# Patient Record
Sex: Female | Born: 1955 | Race: Black or African American | Hispanic: No | Smoking: Never smoker
Health system: Southern US, Community
[De-identification: ages and names within clinical notes are randomized; demographics above are authoritative.]

## PROBLEM LIST (undated history)

## (undated) DIAGNOSIS — M25561 Pain in right knee: Secondary | ICD-10-CM

## (undated) DIAGNOSIS — E785 Hyperlipidemia, unspecified: Secondary | ICD-10-CM

## (undated) DIAGNOSIS — O009 Unspecified ectopic pregnancy without intrauterine pregnancy: Secondary | ICD-10-CM

## (undated) DIAGNOSIS — I10 Essential (primary) hypertension: Secondary | ICD-10-CM

## (undated) DIAGNOSIS — G4700 Insomnia, unspecified: Secondary | ICD-10-CM

## (undated) DIAGNOSIS — G4733 Obstructive sleep apnea (adult) (pediatric): Secondary | ICD-10-CM

## (undated) DIAGNOSIS — I471 Supraventricular tachycardia, unspecified: Secondary | ICD-10-CM

## (undated) DIAGNOSIS — R011 Cardiac murmur, unspecified: Secondary | ICD-10-CM

## (undated) HISTORY — PX: SALPINGECTOMY: SHX328

## (undated) HISTORY — DX: Obstructive sleep apnea (adult) (pediatric): G47.33

## (undated) HISTORY — PX: TMJ ARTHROPLASTY: SHX1066

## (undated) HISTORY — DX: Insomnia, unspecified: G47.00

## (undated) HISTORY — DX: Unspecified ectopic pregnancy without intrauterine pregnancy: O00.90

## (undated) HISTORY — DX: Pain in right knee: M25.561

## (undated) HISTORY — PX: BREAST CYST EXCISION: SHX579

## (undated) HISTORY — DX: Hyperlipidemia, unspecified: E78.5

## (undated) HISTORY — DX: Essential (primary) hypertension: I10

## (undated) HISTORY — PX: ABDOMINAL HYSTERECTOMY: SHX81

## (undated) HISTORY — PX: OTHER SURGICAL HISTORY: SHX169

---

## 2001-03-15 ENCOUNTER — Other Ambulatory Visit: Admission: RE | Admit: 2001-03-15 | Discharge: 2001-03-15 | Payer: Self-pay | Admitting: Obstetrics and Gynecology

## 2001-08-16 ENCOUNTER — Ambulatory Visit (HOSPITAL_COMMUNITY): Admission: RE | Admit: 2001-08-16 | Discharge: 2001-08-16 | Payer: Self-pay | Admitting: Obstetrics and Gynecology

## 2001-08-16 ENCOUNTER — Encounter: Payer: Self-pay | Admitting: Obstetrics and Gynecology

## 2002-04-09 ENCOUNTER — Other Ambulatory Visit: Admission: RE | Admit: 2002-04-09 | Discharge: 2002-04-09 | Payer: Self-pay | Admitting: Obstetrics and Gynecology

## 2003-04-22 ENCOUNTER — Encounter: Payer: Self-pay | Admitting: Family Medicine

## 2003-04-22 ENCOUNTER — Ambulatory Visit (HOSPITAL_COMMUNITY): Admission: RE | Admit: 2003-04-22 | Discharge: 2003-04-22 | Payer: Self-pay | Admitting: Family Medicine

## 2003-12-02 ENCOUNTER — Observation Stay (HOSPITAL_COMMUNITY): Admission: RE | Admit: 2003-12-02 | Discharge: 2003-12-03 | Payer: Self-pay | Admitting: Obstetrics and Gynecology

## 2004-02-05 ENCOUNTER — Emergency Department (HOSPITAL_COMMUNITY): Admission: EM | Admit: 2004-02-05 | Discharge: 2004-02-05 | Payer: Self-pay | Admitting: Emergency Medicine

## 2005-03-15 ENCOUNTER — Ambulatory Visit (HOSPITAL_COMMUNITY): Admission: RE | Admit: 2005-03-15 | Discharge: 2005-03-15 | Payer: Self-pay | Admitting: Family Medicine

## 2005-03-26 ENCOUNTER — Encounter: Admission: RE | Admit: 2005-03-26 | Discharge: 2005-03-26 | Payer: Self-pay | Admitting: Family Medicine

## 2006-06-03 ENCOUNTER — Ambulatory Visit (HOSPITAL_COMMUNITY): Admission: RE | Admit: 2006-06-03 | Discharge: 2006-06-03 | Payer: Self-pay | Admitting: Family Medicine

## 2006-10-18 ENCOUNTER — Other Ambulatory Visit: Admission: RE | Admit: 2006-10-18 | Discharge: 2006-10-18 | Payer: Self-pay | Admitting: Family Medicine

## 2010-12-06 ENCOUNTER — Encounter: Payer: Self-pay | Admitting: Family Medicine

## 2014-11-15 HISTORY — PX: COLONOSCOPY: SHX174

## 2015-04-21 LAB — HM COLONOSCOPY

## 2015-08-27 LAB — HM HEPATITIS C SCREENING LAB: HM Hepatitis Screen: NEGATIVE

## 2015-08-27 LAB — HM HIV SCREENING LAB: HM HIV Screening: NEGATIVE

## 2016-04-20 DIAGNOSIS — I10 Essential (primary) hypertension: Secondary | ICD-10-CM | POA: Diagnosis not present

## 2016-04-20 DIAGNOSIS — Z8249 Family history of ischemic heart disease and other diseases of the circulatory system: Secondary | ICD-10-CM | POA: Diagnosis not present

## 2016-04-20 DIAGNOSIS — F419 Anxiety disorder, unspecified: Secondary | ICD-10-CM | POA: Diagnosis not present

## 2016-04-20 DIAGNOSIS — I159 Secondary hypertension, unspecified: Secondary | ICD-10-CM | POA: Diagnosis not present

## 2016-04-20 DIAGNOSIS — Z885 Allergy status to narcotic agent status: Secondary | ICD-10-CM | POA: Diagnosis not present

## 2016-04-20 DIAGNOSIS — Z6841 Body Mass Index (BMI) 40.0 and over, adult: Secondary | ICD-10-CM | POA: Diagnosis not present

## 2016-04-20 DIAGNOSIS — R42 Dizziness and giddiness: Secondary | ICD-10-CM | POA: Diagnosis not present

## 2016-07-22 DIAGNOSIS — J301 Allergic rhinitis due to pollen: Secondary | ICD-10-CM | POA: Diagnosis not present

## 2016-07-22 DIAGNOSIS — E010 Iodine-deficiency related diffuse (endemic) goiter: Secondary | ICD-10-CM | POA: Diagnosis not present

## 2016-07-22 DIAGNOSIS — R7301 Impaired fasting glucose: Secondary | ICD-10-CM | POA: Diagnosis not present

## 2016-07-22 DIAGNOSIS — M1710 Unilateral primary osteoarthritis, unspecified knee: Secondary | ICD-10-CM | POA: Diagnosis not present

## 2016-07-22 DIAGNOSIS — I10 Essential (primary) hypertension: Secondary | ICD-10-CM | POA: Diagnosis not present

## 2016-08-06 DIAGNOSIS — Z23 Encounter for immunization: Secondary | ICD-10-CM | POA: Diagnosis not present

## 2017-01-14 DIAGNOSIS — I10 Essential (primary) hypertension: Secondary | ICD-10-CM | POA: Diagnosis not present

## 2017-01-14 DIAGNOSIS — M25512 Pain in left shoulder: Secondary | ICD-10-CM | POA: Diagnosis not present

## 2017-01-14 DIAGNOSIS — Z1231 Encounter for screening mammogram for malignant neoplasm of breast: Secondary | ICD-10-CM | POA: Diagnosis not present

## 2017-01-28 DIAGNOSIS — S46812A Strain of other muscles, fascia and tendons at shoulder and upper arm level, left arm, initial encounter: Secondary | ICD-10-CM | POA: Diagnosis not present

## 2017-04-21 DIAGNOSIS — Z6841 Body Mass Index (BMI) 40.0 and over, adult: Secondary | ICD-10-CM | POA: Diagnosis not present

## 2017-04-21 DIAGNOSIS — M25561 Pain in right knee: Secondary | ICD-10-CM | POA: Diagnosis not present

## 2017-04-21 DIAGNOSIS — I10 Essential (primary) hypertension: Secondary | ICD-10-CM | POA: Diagnosis not present

## 2017-07-20 DIAGNOSIS — J0101 Acute recurrent maxillary sinusitis: Secondary | ICD-10-CM | POA: Diagnosis not present

## 2017-07-20 DIAGNOSIS — I10 Essential (primary) hypertension: Secondary | ICD-10-CM | POA: Diagnosis not present

## 2017-07-20 DIAGNOSIS — Z6841 Body Mass Index (BMI) 40.0 and over, adult: Secondary | ICD-10-CM | POA: Diagnosis not present

## 2017-07-20 DIAGNOSIS — M25561 Pain in right knee: Secondary | ICD-10-CM | POA: Diagnosis not present

## 2017-07-25 DIAGNOSIS — R05 Cough: Secondary | ICD-10-CM | POA: Diagnosis not present

## 2017-07-25 DIAGNOSIS — R062 Wheezing: Secondary | ICD-10-CM | POA: Diagnosis not present

## 2017-07-25 DIAGNOSIS — Z6841 Body Mass Index (BMI) 40.0 and over, adult: Secondary | ICD-10-CM | POA: Diagnosis not present

## 2017-09-02 DIAGNOSIS — Z1231 Encounter for screening mammogram for malignant neoplasm of breast: Secondary | ICD-10-CM | POA: Diagnosis not present

## 2017-11-23 DIAGNOSIS — I10 Essential (primary) hypertension: Secondary | ICD-10-CM | POA: Diagnosis not present

## 2017-11-23 DIAGNOSIS — Z6841 Body Mass Index (BMI) 40.0 and over, adult: Secondary | ICD-10-CM | POA: Diagnosis not present

## 2017-11-23 DIAGNOSIS — G4700 Insomnia, unspecified: Secondary | ICD-10-CM | POA: Diagnosis not present

## 2017-11-23 DIAGNOSIS — M25561 Pain in right knee: Secondary | ICD-10-CM | POA: Diagnosis not present

## 2017-12-14 DIAGNOSIS — I10 Essential (primary) hypertension: Secondary | ICD-10-CM | POA: Diagnosis not present

## 2017-12-14 DIAGNOSIS — J0101 Acute recurrent maxillary sinusitis: Secondary | ICD-10-CM | POA: Diagnosis not present

## 2017-12-27 DIAGNOSIS — G4700 Insomnia, unspecified: Secondary | ICD-10-CM | POA: Diagnosis not present

## 2017-12-27 DIAGNOSIS — I10 Essential (primary) hypertension: Secondary | ICD-10-CM | POA: Diagnosis not present

## 2017-12-27 DIAGNOSIS — M25561 Pain in right knee: Secondary | ICD-10-CM | POA: Diagnosis not present

## 2017-12-27 DIAGNOSIS — Z6841 Body Mass Index (BMI) 40.0 and over, adult: Secondary | ICD-10-CM | POA: Diagnosis not present

## 2018-01-19 ENCOUNTER — Encounter: Payer: Self-pay | Admitting: *Deleted

## 2018-01-20 ENCOUNTER — Ambulatory Visit: Payer: BLUE CROSS/BLUE SHIELD | Admitting: Cardiovascular Disease

## 2018-01-20 ENCOUNTER — Telehealth: Payer: Self-pay | Admitting: Cardiovascular Disease

## 2018-01-20 ENCOUNTER — Encounter: Payer: Self-pay | Admitting: Cardiovascular Disease

## 2018-01-20 VITALS — BP 125/76 | HR 70 | Ht 70.0 in | Wt 295.0 lb

## 2018-01-20 DIAGNOSIS — I10 Essential (primary) hypertension: Secondary | ICD-10-CM

## 2018-01-20 DIAGNOSIS — I471 Supraventricular tachycardia: Secondary | ICD-10-CM | POA: Diagnosis not present

## 2018-01-20 NOTE — Telephone Encounter (Signed)
Pre-cert Verification for the following procedure   °Echo scheduled for 01-25-2018  °

## 2018-01-20 NOTE — Progress Notes (Signed)
CARDIOLOGY CONSULT NOTE  Patient ID: Kristine Mata MRN: [id number] DOB/AGE: May 29, 1956 62 y.o.  Admit date: (Not on file) Primary Physician: Estanislado Pandy, MD Referring Physician: Almedia Balls PA-C  Reason for Consultation: Paroxysmal SVT  HPI: Kristine Mata is a 62 y.o. female who is being seen today for the evaluation of paroxysmal SVT at the request of Almedia Balls, PA-C.  ECG performed today which I reviewed demonstrates sinus rhythm with LVH criteria.    I reviewed labs performed on 11/23/17: BUN 9, creatinine 0.63, sodium 143, potassium 4.1, hemoglobin 11.9, TSH 0.5.  Additional past medical history includes morbid obesity and hypertension.  She tells me she worked for [employer] for the past 10 years and moved back to [place] from [place] about a year ago.  She had been on losartan for hypertension and was switched to only [place].  She had been free of palpitations for the past 10 years but after this medication switch, she developed frequent palpitations sometimes awakening her at night.  She denies associated chest pain and shortness of breath.  She was then placed back on losartan.  She then had occasional palpitation for about 2 weeks but has had no palpitations whatsoever for the past 2 weeks.  She said she has put on a lot of weight and wants to begin exercising at the [facility].  She does have some right knee arthritis.  She said her first episode of SVT occurred in 1987 and she was evaluated at [facility] at that time.  She said she began working at the age of 17 and this is the first year she has not been working.  She also worked for [employer] for 23 years.    Allergies  Allergen Reactions  . Codeine Nausea And Vomiting    Current Outpatient Medications  Medication Sig Dispense Refill  . furosemide (LASIX) 40 MG tablet Take 40 mg by mouth.    . losartan (COZAAR) 100 MG tablet Take 100 mg by mouth daily.    . metoprolol (TOPROL-XL) 200 MG 24 hr  tablet Take 200 mg by mouth daily.    . potassium chloride (K-DUR,KLOR-CON) 10 MEQ tablet Take 10 mEq by mouth daily.     No current facility-administered medications for this visit.     Past Medical History:  Diagnosis Date  . Hypertension   . Insomnia, unspecified   . Right knee pain     History reviewed. No pertinent surgical history.  Social History   Socioeconomic History  . Marital status: Married    Spouse name: Not on file  . Number of children: Not on file  . Years of education: Not on file  . Highest education level: Not on file  Social Needs  . Financial resource strain: Not on file  . Food insecurity - worry: Not on file  . Food insecurity - inability: Not on file  . Transportation needs - medical: Not on file  . Transportation needs - non-medical: Not on file  Occupational History  . Not on file  Tobacco Use  . Smoking status: Never Smoker  . Smokeless tobacco: Never Used  Substance and Sexual Activity  . Alcohol use: Not on file  . Drug use: Not on file  . Sexual activity: Not on file  Other Topics Concern  . Not on file  Social History Narrative  . Not on file     No family history of premature CAD in 1st degree relatives.  Current Meds  Medication Sig  . furosemide (LASIX) 40 MG tablet Take 40 mg by mouth.  . losartan (COZAAR) 100 MG tablet Take 100 mg by mouth daily.  . metoprolol (TOPROL-XL) 200 MG 24 hr tablet Take 200 mg by mouth daily.  . potassium chloride (K-DUR,KLOR-CON) 10 MEQ tablet Take 10 mEq by mouth daily.      Review of systems complete and found to be negative unless listed above in HPI    Physical exam Blood pressure 125/76, pulse 70, height 5\' 10"  (1.778 m), weight 295 lb (133.8 kg), SpO2 96 %. General: NAD Neck: No JVD, no thyromegaly or thyroid nodule.  Lungs: Clear to auscultation bilaterally with normal respiratory effort. CV: Nondisplaced PMI. Regular rate and rhythm, normal S1/S2, no S3/S4, no murmur.  Trace left  peri-ankle and dorsal pedal edema.  No carotid bruit.    Abdomen: Soft, nontender, no distention.  Skin: Intact without lesions or rashes.  Neurologic: Alert and oriented x 3.  Psych: Normal affect. Extremities: No clubbing or cyanosis.  HEENT: Normal.   ECG: Most recent ECG reviewed.   Labs: No results found for: K, BUN, CREATININE, ALT, TSH, HGB   Lipids: No results found for: LDLCALC, LDLDIRECT, CHOL, TRIG, HDL      ASSESSMENT AND PLAN:  1.  Paroxysmal SVT: Symptomatically stable for the past 2 weeks.  Continue metoprolol succinate.  If she were to develop symptom recurrence, I would consider event monitoring at that time.  2.  Hypertension: Controlled on present therapy.  LVH is seen on ECG as noted above.  I will obtain an echocardiogram to evaluate cardiac structure and function.     Disposition: Follow up in 6 months  Signed: Prentice DockerSuresh Demetrie Borge, M.D., F.A.C.C.  01/20/2018, 9:27 AM

## 2018-01-20 NOTE — Patient Instructions (Signed)
Your physician wants you to follow-up in: 6 MONTHS WITH DR KONESWARAN You will receive a reminder letter in the mail two months in advance. If you don't receive a letter, please call our office to schedule the follow-up appointment.  Your physician recommends that you continue on your current medications as directed. Please refer to the Current Medication list given to you today.  Your physician has requested that you have an echocardiogram. Echocardiography is a painless test that uses sound waves to create images of your heart. It provides your doctor with information about the size and shape of your heart and how well your heart's chambers and valves are working. This procedure takes approximately one hour. There are no restrictions for this procedure.  Thank you for choosing [facility]!!    

## 2018-01-24 ENCOUNTER — Other Ambulatory Visit: Payer: Self-pay | Admitting: Cardiovascular Disease

## 2018-01-24 ENCOUNTER — Other Ambulatory Visit: Payer: BLUE CROSS/BLUE SHIELD

## 2018-01-24 DIAGNOSIS — R0989 Other specified symptoms and signs involving the circulatory and respiratory systems: Secondary | ICD-10-CM

## 2018-01-24 DIAGNOSIS — I471 Supraventricular tachycardia: Secondary | ICD-10-CM

## 2018-01-24 DIAGNOSIS — R9431 Abnormal electrocardiogram [ECG] [EKG]: Secondary | ICD-10-CM

## 2018-01-25 DIAGNOSIS — J0101 Acute recurrent maxillary sinusitis: Secondary | ICD-10-CM | POA: Diagnosis not present

## 2018-02-15 ENCOUNTER — Other Ambulatory Visit: Payer: BLUE CROSS/BLUE SHIELD

## 2018-03-16 ENCOUNTER — Other Ambulatory Visit: Payer: Self-pay | Admitting: Cardiovascular Disease

## 2018-03-16 ENCOUNTER — Telehealth: Payer: Self-pay | Admitting: Cardiovascular Disease

## 2018-03-16 ENCOUNTER — Other Ambulatory Visit: Payer: Self-pay | Admitting: *Deleted

## 2018-03-16 DIAGNOSIS — I471 Supraventricular tachycardia, unspecified: Secondary | ICD-10-CM | POA: Insufficient documentation

## 2018-03-16 NOTE — Telephone Encounter (Signed)
Echo rescheduled for 04/06/18 ( authorization expired on 02/18/2018)

## 2018-03-17 ENCOUNTER — Emergency Department (HOSPITAL_COMMUNITY)
Admission: EM | Admit: 2018-03-17 | Discharge: 2018-03-17 | Disposition: A | Discharge status: Home / Self Care | Payer: BLUE CROSS/BLUE SHIELD | Attending: Emergency Medicine | Admitting: Emergency Medicine

## 2018-03-17 ENCOUNTER — Emergency Department (HOSPITAL_COMMUNITY): Payer: BLUE CROSS/BLUE SHIELD

## 2018-03-17 ENCOUNTER — Other Ambulatory Visit: Payer: Self-pay

## 2018-03-17 ENCOUNTER — Encounter (HOSPITAL_COMMUNITY): Payer: Self-pay | Admitting: Emergency Medicine

## 2018-03-17 DIAGNOSIS — Z7982 Long term (current) use of aspirin: Secondary | ICD-10-CM | POA: Diagnosis not present

## 2018-03-17 DIAGNOSIS — I10 Essential (primary) hypertension: Secondary | ICD-10-CM | POA: Insufficient documentation

## 2018-03-17 DIAGNOSIS — R079 Chest pain, unspecified: Secondary | ICD-10-CM | POA: Diagnosis not present

## 2018-03-17 DIAGNOSIS — R61 Generalized hyperhidrosis: Secondary | ICD-10-CM | POA: Diagnosis not present

## 2018-03-17 DIAGNOSIS — R002 Palpitations: Secondary | ICD-10-CM | POA: Diagnosis not present

## 2018-03-17 DIAGNOSIS — Z79899 Other long term (current) drug therapy: Secondary | ICD-10-CM | POA: Insufficient documentation

## 2018-03-17 DIAGNOSIS — R0789 Other chest pain: Secondary | ICD-10-CM | POA: Diagnosis not present

## 2018-03-17 HISTORY — DX: Supraventricular tachycardia: I47.1

## 2018-03-17 HISTORY — DX: Cardiac murmur, unspecified: R01.1

## 2018-03-17 HISTORY — DX: Supraventricular tachycardia, unspecified: I47.10

## 2018-03-17 LAB — BASIC METABOLIC PANEL
Anion gap: 12 (ref 5–15)
BUN: 17 mg/dL (ref 6–20)
CALCIUM: 10.3 mg/dL (ref 8.9–10.3)
CO2: 25 mmol/L (ref 22–32)
CREATININE: 0.56 mg/dL (ref 0.44–1.00)
Chloride: 104 mmol/L (ref 101–111)
GFR calc Af Amer: >60 mL/min (ref >60)
Glucose, Bld: 122 mg/dL — ABNORMAL HIGH (ref 65–99)
Potassium: 4 mmol/L (ref 3.5–5.1)
Sodium: 141 mmol/L (ref 135–145)

## 2018-03-17 LAB — CBC WITH DIFFERENTIAL/PLATELET
Basophils Absolute: 0 10*3/uL (ref 0.0–0.1)
Basophils Relative: 0 %
EOS PCT: 3 %
Eosinophils Absolute: 0.1 10*3/uL (ref 0.0–0.7)
HCT: 40.3 % (ref 36.0–46.0)
Hemoglobin: 12.6 g/dL (ref 12.0–15.0)
LYMPHS ABS: 2.3 10*3/uL (ref 0.7–4.0)
LYMPHS PCT: 44 %
MCH: 28.6 pg (ref 26.0–34.0)
MCHC: 31.3 g/dL (ref 30.0–36.0)
MCV: 91.6 fL (ref 78.0–100.0)
MONOS PCT: 9 %
Monocytes Absolute: 0.5 10*3/uL (ref 0.1–1.0)
Neutro Abs: 2.3 10*3/uL (ref 1.7–7.7)
Neutrophils Relative %: 44 %
PLATELETS: 299 10*3/uL (ref 150–400)
RBC: 4.4 MIL/uL (ref 3.87–5.11)
RDW: 13.7 % (ref 11.5–15.5)
WBC: 5.2 10*3/uL (ref 4.0–10.5)

## 2018-03-17 LAB — I-STAT TROPONIN, ED
Troponin i, poc: 0 ng/mL (ref 0.00–0.08)
Troponin i, poc: 0 ng/mL (ref 0.00–0.08)

## 2018-03-17 NOTE — ED Notes (Signed)
Pt c/o nausea earlier but none now

## 2018-03-17 NOTE — Discharge Instructions (Addendum)
The chemical for your heart called troponin was normal x2 different occasions.  Recommend follow-up with cardiology.  Phone number given.  Return to the emergency department if worse.

## 2018-03-17 NOTE — ED Triage Notes (Signed)
Pt  Having palpitations for weeks has seen cardiiologist. Has echo apt in a few days. C/o chest tightness started today with sweating. Non diahoretic. A/o.

## 2018-03-18 NOTE — ED Provider Notes (Signed)
[facility] EMERGENCY DEPARTMENT Provider Note   CSN: [id number] Arrival date & time: 03/17/18  1048     History   Chief Complaint Chief Complaint  Patient presents with  . Palpitations    HPI Kristine Mata is a 62 y.o. female.  Patient presents with tightness in her chest since 2:30 PM today with with questionable dyspnea and diaphoresis.  Symptoms have now abated.  She is complained of "palpitations" for 2 months.  Past medical history includes hypertension, obesity, SVT.  She tried Tylenol at home with minimal relief.  Severity is mild to moderate.  Exertion does not make symptoms worse.  No history of CAD.  No recent travel, prolonged immobilization, surgery.     Past Medical History:  Diagnosis Date  . Heart murmur   . Hypertension   . Insomnia, unspecified   . Right knee pain   . SVT (supraventricular tachycardia) [facility])     Patient Active Problem List   Diagnosis Date Noted  . PSVT (paroxysmal supraventricular tachycardia) (HCC) 03/16/2018  . Hypertension     Past Surgical History:  Procedure Laterality Date  . ABDOMINAL HYSTERECTOMY    . TMJ ARTHROPLASTY       OB History    Gravida  3   Para      Term      Preterm      AB      Living  1     SAB      TAB      Ectopic      Multiple      Live Births               Home Medications    Prior to Admission medications   Medication Sig Start Date End Date Taking? Authorizing Provider  aspirin EC 81 MG tablet Take 81 mg by mouth daily.   Yes [provider]  fluticasone (FLONASE) 50 MCG/ACT nasal spray Place 1 spray into both nostrils daily as needed. 01/25/18  Yes [provider]  furosemide (LASIX) 40 MG tablet Take 40 mg by mouth daily.    Yes [provider]  losartan (COZAAR) 100 MG tablet Take 100 mg by mouth daily.   Yes [provider]  metoprolol (TOPROL-XL) 200 MG 24 hr tablet Take 200 mg by mouth daily.   Yes [provider]    potassium chloride (K-DUR,KLOR-CON) 10 MEQ tablet Take 10 mEq by mouth daily.   Yes [provider]    Family History Family History  Problem Relation Age of Onset  . Heart disease Mother        used nitroglycerin  . Hypertension Mother   . Thyroid disease Mother   . Hypertension Father   . Diabetes Sister   . Hypertension Sister   . Hypertension Brother   . Thyroid disease Brother   . Hypertension Sister   . Colon cancer Sister   . Hypertension Brother   . Heart disease Brother     Social History Social History   Tobacco Use  . Smoking status: Never Smoker  . Smokeless tobacco: Never Used  Substance Use Topics  . Alcohol use: Never    Frequency: Never  . Drug use: Never     Allergies   Codeine   Review of Systems Review of Systems  All other systems reviewed and are negative.    Physical Exam Updated Vital Signs BP 137/73   Pulse (!) 58   Resp 16  Ht  (1.778 m)   Wt 133.8 kg (295 lb)   SpO2 99%   BMI 42.33 kg/m   Physical Exam  Constitutional: She is oriented to person, place, and time. She appears well-developed and well-nourished.  Obese, nad  HENT:  Head: Normocephalic and atraumatic.  Eyes: Conjunctivae are normal.  Neck: Neck supple.  Cardiovascular: Normal rate and regular rhythm.  Pulmonary/Chest: Effort normal and breath sounds normal.  Abdominal: Soft. Bowel sounds are normal.  Musculoskeletal: Normal range of motion.  Neurological: She is alert and oriented to person, place, and time.  Skin: Skin is warm and dry.  Psychiatric: She has a normal mood and affect. Her behavior is normal.  Nursing note and vitals reviewed.    ED Treatments / Results  Labs (all labs ordered are listed, but only abnormal results are displayed) Labs Reviewed  BASIC METABOLIC PANEL - Abnormal; Notable for the following components:      Result Value   Glucose, Bld 122 (*)    All other components within normal limits  CBC WITH  DIFFERENTIAL/PLATELET  I-STAT TROPONIN, ED  I-STAT TROPONIN, ED    EKG EKG Interpretation  Date/Time:  Friday Mar 17 2018 10:56:37 EDT Ventricular Rate:  83 PR Interval:    QRS Duration: 98 QT Interval:  395 QTC Calculation: 465 R Axis:   17 Text Interpretation:  Sinus rhythm Ventricular premature complex Probable left atrial enlargement LVH with secondary repolarization abnormality Confirmed by Donnetta Hutching ([id number]) on 03/18/2018 7:40:09 AM   Radiology Dg Chest 2 View  Result Date: 03/17/2018 CLINICAL DATA:  Chest pain. EXAM: CHEST - 2 VIEW COMPARISON:  None. FINDINGS: The heart size and mediastinal contours are within normal limits. Both lungs are clear except for a tiny area of atelectasis or scarring in the left midzone. The visualized skeletal structures are unremarkable. IMPRESSION: No significant abnormalities. Electronically Signed   By: Francene Boyers M.D.   On: 03/17/2018 11:30    Procedures Procedures (including critical care time)  Medications Ordered in ED Medications - No data to display   Initial Impression / Assessment and Plan / ED Course  I have reviewed the triage vital signs and the nursing notes.  Pertinent labs & imaging results that were available during my care of the patient were reviewed by me and considered in my medical decision making (see chart for details).     Patient presents with chest pain.  No prior history of CAD.  EKG, troponins x2 negative.  She will be referred for outpatient cardiology for further work-up.  Final Clinical Impressions(s) / ED Diagnoses   Final diagnoses:  Chest pain, unspecified type    ED Discharge Orders    None       Donnetta Hutching, MD 03/18/18 [phone]

## 2018-04-06 ENCOUNTER — Other Ambulatory Visit: Payer: Self-pay

## 2018-04-06 ENCOUNTER — Ambulatory Visit (INDEPENDENT_AMBULATORY_CARE_PROVIDER_SITE_OTHER): Payer: BLUE CROSS/BLUE SHIELD

## 2018-04-06 DIAGNOSIS — I471 Supraventricular tachycardia: Secondary | ICD-10-CM

## 2018-04-12 ENCOUNTER — Telehealth: Payer: Self-pay | Admitting: Cardiovascular Disease

## 2018-04-12 ENCOUNTER — Telehealth: Payer: Self-pay | Admitting: *Deleted

## 2018-04-12 DIAGNOSIS — I517 Cardiomegaly: Secondary | ICD-10-CM

## 2018-04-12 NOTE — Telephone Encounter (Signed)
Notes recorded by Lesle Chris, LPN on 02/21/8118 at 11:18 AM EDT Patient notified. Copy to pmd. Follow up scheduled for September with Dr. Purvis Sheffield. She agrees to doing the limited echo with saline contrast.   ------  Notes recorded by Jonelle Sidle, MD on 04/07/2018 at 12:12 PM EDT Patient of Dr. Purvis Sheffield. He is currently out of the office. Results forwarded to my inbox for review. LVEF is normal at 60 to 65% and there is mild LVH. No major valvular abnormalities noted. Cannot exclude small PFO based on limited images and color Doppler. Suggest arranging a limited saline contrast echocardiogram for further assessment.

## 2018-04-12 NOTE — Telephone Encounter (Signed)
Pre-cert Verification for the following procedure   Limited echo with contrast scheduled for 04/14/2018 at [facility]

## 2018-04-14 ENCOUNTER — Ambulatory Visit (HOSPITAL_COMMUNITY): Payer: BLUE CROSS/BLUE SHIELD

## 2018-04-14 ENCOUNTER — Telehealth: Payer: Self-pay | Admitting: Cardiovascular Disease

## 2018-04-14 NOTE — Telephone Encounter (Signed)
Patient states she has another appointment today and wanted to cancel test. Patient states she will call back to reschedule, but would like to speak with Dr. Caesar ChestnutKoneswarans nurse again regarding what was previously discussed.

## 2018-04-14 NOTE — Telephone Encounter (Signed)
Call patient back before cancelling test for today

## 2018-04-14 NOTE — Telephone Encounter (Signed)
Left message to return call 

## 2018-04-19 NOTE — Telephone Encounter (Signed)
Left message to return call 

## 2018-04-20 ENCOUNTER — Telehealth: Payer: Self-pay | Admitting: Cardiovascular Disease

## 2018-04-20 NOTE — Telephone Encounter (Signed)
[name], Kristine Mata 2 hours ago (10:27 AM)      Patient wants to discuss the echo procedure that was scheduled for 04/14/2018. She cancelled the test.

## 2018-04-20 NOTE — Telephone Encounter (Signed)
Returned call to patient.  Explained how test was done again.  Just like doing regular echo with the addition of IV being placed in the patient's arm & during portions of the imaging, saline bubbles are injected int the vein.  Sterile saline (salt water) is used & should not cause adverse effects to the patient.  Patient verbalized understanding & agrees to reschedule the test.  [facility].

## 2018-04-20 NOTE — Telephone Encounter (Signed)
Patient wants to discuss the echo procedure that was scheduled for 04/14/2018. She cancelled the test.

## 2018-04-21 ENCOUNTER — Other Ambulatory Visit: Payer: Self-pay | Admitting: *Deleted

## 2018-04-21 ENCOUNTER — Telehealth: Payer: Self-pay | Admitting: Cardiovascular Disease

## 2018-04-21 DIAGNOSIS — I471 Supraventricular tachycardia: Secondary | ICD-10-CM

## 2018-04-21 NOTE — Telephone Encounter (Signed)
Need to verify on percert. Patient is being re-scheduled for a limited echo with contrast on 04/26/2018 at [facility]

## 2018-04-26 ENCOUNTER — Other Ambulatory Visit (HOSPITAL_COMMUNITY): Payer: BLUE CROSS/BLUE SHIELD

## 2018-04-27 DIAGNOSIS — I471 Supraventricular tachycardia: Secondary | ICD-10-CM | POA: Diagnosis not present

## 2018-04-27 DIAGNOSIS — M25561 Pain in right knee: Secondary | ICD-10-CM | POA: Diagnosis not present

## 2018-04-27 DIAGNOSIS — G4700 Insomnia, unspecified: Secondary | ICD-10-CM | POA: Diagnosis not present

## 2018-04-27 DIAGNOSIS — I10 Essential (primary) hypertension: Secondary | ICD-10-CM | POA: Diagnosis not present

## 2018-05-04 ENCOUNTER — Ambulatory Visit (HOSPITAL_COMMUNITY)
Admission: RE | Admit: 2018-05-04 | Discharge: 2018-05-04 | Disposition: A | Discharge status: Home / Self Care | Payer: BLUE CROSS/BLUE SHIELD | Source: Ambulatory Visit | Attending: Cardiovascular Disease | Admitting: Cardiovascular Disease

## 2018-05-04 DIAGNOSIS — I10 Essential (primary) hypertension: Secondary | ICD-10-CM | POA: Diagnosis not present

## 2018-05-04 DIAGNOSIS — I471 Supraventricular tachycardia: Secondary | ICD-10-CM | POA: Insufficient documentation

## 2018-05-04 DIAGNOSIS — Z6841 Body Mass Index (BMI) 40.0 and over, adult: Secondary | ICD-10-CM | POA: Insufficient documentation

## 2018-05-04 NOTE — Progress Notes (Signed)
*  PRELIMINARY RESULTS* Echocardiogram Limited 2 D Echocardiogram has been performed with agitated saline contrast study.  Stacey DrainWhite, Stina Gane J 05/04/2018, 1:49 PM

## 2018-05-08 ENCOUNTER — Telehealth: Payer: Self-pay | Admitting: Cardiovascular Disease

## 2018-05-08 DIAGNOSIS — R000 Tachycardia, unspecified: Secondary | ICD-10-CM

## 2018-05-08 DIAGNOSIS — R002 Palpitations: Secondary | ICD-10-CM

## 2018-05-08 NOTE — Telephone Encounter (Signed)
Patient c/o having palpitations / tachycardia still - becoming more bothersome at night.  Not sleeping well due to this.  No c/o chest pain, dizziness, or SOB.

## 2018-05-08 NOTE — Telephone Encounter (Signed)
Patient returned call to the office wanting to know if she would be called back today.

## 2018-05-08 NOTE — Telephone Encounter (Signed)
Notes recorded by Lesle ChrisHill, Rylon Poitra G, LPN on 1/61/09606/24/2019 at 3:45 PM EDT Patient notified. Copy to pmd. Follow up scheduled for September with Dr. Purvis SheffieldKoneswaran in [place] office. ------  Notes recorded by Laqueta LindenKoneswaran, Suresh A, MD on 05/04/2018 at 4:32 PM EDT No PFO. Normal pumping function.

## 2018-05-09 NOTE — Telephone Encounter (Signed)
Obtain 2 week event monitor.

## 2018-05-10 NOTE — Telephone Encounter (Signed)
Patient notified and verbalized understanding. 

## 2018-05-24 DIAGNOSIS — J0101 Acute recurrent maxillary sinusitis: Secondary | ICD-10-CM | POA: Diagnosis not present

## 2018-05-24 DIAGNOSIS — J209 Acute bronchitis, unspecified: Secondary | ICD-10-CM | POA: Diagnosis not present

## 2018-07-20 ENCOUNTER — Telehealth: Payer: Self-pay | Admitting: *Deleted

## 2018-07-20 NOTE — Telephone Encounter (Signed)
Called patient to follow up on cardiac event monitor ordered 05/11/2018.  Multiple attempts made to contact patient & finally reached her today.  Stated that she did not feel like she needed the monitor after having the normal echo.  Stated that she declined when [employer] called her, but patient did not notify us.  Stated that she only has the palpitations occasionally now.  Had been taking some vitamins that she has now stopped.  Also, tries to watch what she eats / drinks (caffeine) etc...  Explained to patient that the Echo was looking for the PFO & the monitor was for the palpitations.  Suggested that she keep her OV as scheduled for 07/25/2018 with Dr. Purvis Sheffield & she can discuss these issues further with provider at that time.  She verbalized understanding.

## 2018-07-25 ENCOUNTER — Ambulatory Visit: Payer: BLUE CROSS/BLUE SHIELD | Admitting: Cardiovascular Disease

## 2018-08-15 DIAGNOSIS — I10 Essential (primary) hypertension: Secondary | ICD-10-CM | POA: Diagnosis not present

## 2018-08-15 DIAGNOSIS — I471 Supraventricular tachycardia: Secondary | ICD-10-CM | POA: Diagnosis not present

## 2018-08-23 DIAGNOSIS — Z1389 Encounter for screening for other disorder: Secondary | ICD-10-CM | POA: Diagnosis not present

## 2018-08-23 DIAGNOSIS — Z1331 Encounter for screening for depression: Secondary | ICD-10-CM | POA: Diagnosis not present

## 2018-08-23 DIAGNOSIS — I471 Supraventricular tachycardia: Secondary | ICD-10-CM | POA: Diagnosis not present

## 2018-08-23 DIAGNOSIS — I10 Essential (primary) hypertension: Secondary | ICD-10-CM | POA: Diagnosis not present

## 2018-09-13 ENCOUNTER — Ambulatory Visit: Payer: BLUE CROSS/BLUE SHIELD | Admitting: Cardiovascular Disease

## 2018-11-24 ENCOUNTER — Telehealth: Payer: Self-pay | Admitting: Cardiovascular Disease

## 2018-11-24 NOTE — Telephone Encounter (Signed)
Numerous attempts to contact patient with recall letters. Unable to reach by telephone. with no success.    ] 01/20/2018 9:48 AM New [10]    Zachary George T [[id number]] 03/16/2018 9:02 AM Notification Sent [20]   Zachary George T [[id number]] 04/05/2018 9:37 AM Scheduled/Linked [30]   Geraldine Contras [[id number]] 07/24/2018 11:46 AM Notification Sent [20]   Geraldine Contras [[id number]] 07/24/2018 11:46 AM Scheduled/Linked [30]   Albertine Patricia, CMA [[id number]] 09/12/2018 11:14 AM Notification Sent [20]

## 2019-01-12 DIAGNOSIS — R739 Hyperglycemia, unspecified: Secondary | ICD-10-CM | POA: Diagnosis not present

## 2019-01-12 DIAGNOSIS — I471 Supraventricular tachycardia: Secondary | ICD-10-CM | POA: Diagnosis not present

## 2019-01-12 DIAGNOSIS — M25561 Pain in right knee: Secondary | ICD-10-CM | POA: Diagnosis not present

## 2019-01-12 DIAGNOSIS — M1711 Unilateral primary osteoarthritis, right knee: Secondary | ICD-10-CM | POA: Diagnosis not present

## 2019-01-12 DIAGNOSIS — G4700 Insomnia, unspecified: Secondary | ICD-10-CM | POA: Diagnosis not present

## 2019-01-12 DIAGNOSIS — I10 Essential (primary) hypertension: Secondary | ICD-10-CM | POA: Diagnosis not present

## 2019-03-07 DIAGNOSIS — R350 Frequency of micturition: Secondary | ICD-10-CM | POA: Diagnosis not present

## 2019-03-07 DIAGNOSIS — R300 Dysuria: Secondary | ICD-10-CM | POA: Diagnosis not present

## 2019-04-24 DIAGNOSIS — M170 Bilateral primary osteoarthritis of knee: Secondary | ICD-10-CM | POA: Diagnosis not present

## 2019-04-24 DIAGNOSIS — M25561 Pain in right knee: Secondary | ICD-10-CM | POA: Diagnosis not present

## 2019-05-16 DIAGNOSIS — M25561 Pain in right knee: Secondary | ICD-10-CM | POA: Diagnosis not present

## 2019-05-16 DIAGNOSIS — I471 Supraventricular tachycardia: Secondary | ICD-10-CM | POA: Diagnosis not present

## 2019-05-16 DIAGNOSIS — I10 Essential (primary) hypertension: Secondary | ICD-10-CM | POA: Diagnosis not present

## 2019-05-16 DIAGNOSIS — M1711 Unilateral primary osteoarthritis, right knee: Secondary | ICD-10-CM | POA: Diagnosis not present

## 2019-05-16 DIAGNOSIS — G4700 Insomnia, unspecified: Secondary | ICD-10-CM | POA: Diagnosis not present

## 2019-07-12 ENCOUNTER — Ambulatory Visit (INDEPENDENT_AMBULATORY_CARE_PROVIDER_SITE_OTHER): Payer: BLUE CROSS/BLUE SHIELD | Admitting: Otolaryngology

## 2019-07-12 DIAGNOSIS — H6983 Other specified disorders of Eustachian tube, bilateral: Secondary | ICD-10-CM | POA: Diagnosis not present

## 2019-07-12 DIAGNOSIS — J342 Deviated nasal septum: Secondary | ICD-10-CM

## 2019-07-12 DIAGNOSIS — J343 Hypertrophy of nasal turbinates: Secondary | ICD-10-CM

## 2019-07-12 DIAGNOSIS — H6121 Impacted cerumen, right ear: Secondary | ICD-10-CM | POA: Diagnosis not present

## 2019-08-30 ENCOUNTER — Telehealth: Payer: Self-pay | Admitting: *Deleted

## 2019-08-30 NOTE — Telephone Encounter (Signed)
Patient verbally consented for tele-health visits with [facility] and understands that her insurance company will be billed for the encounter.  Aware to have vitals available   

## 2019-09-14 DIAGNOSIS — G4700 Insomnia, unspecified: Secondary | ICD-10-CM | POA: Diagnosis not present

## 2019-09-26 ENCOUNTER — Telehealth (INDEPENDENT_AMBULATORY_CARE_PROVIDER_SITE_OTHER): Payer: BC Managed Care – PPO | Admitting: Cardiovascular Disease

## 2019-09-26 ENCOUNTER — Encounter: Payer: Self-pay | Admitting: Cardiovascular Disease

## 2019-09-26 ENCOUNTER — Telehealth: Payer: Self-pay | Admitting: Cardiovascular Disease

## 2019-09-26 VITALS — BP 140/82 | HR 81 | Ht 70.0 in | Wt 290.0 lb

## 2019-09-26 DIAGNOSIS — R002 Palpitations: Secondary | ICD-10-CM | POA: Diagnosis not present

## 2019-09-26 DIAGNOSIS — I10 Essential (primary) hypertension: Secondary | ICD-10-CM

## 2019-09-26 DIAGNOSIS — I471 Supraventricular tachycardia: Secondary | ICD-10-CM

## 2019-09-26 NOTE — Progress Notes (Signed)
Virtual Visit via Telephone Note   This visit type was conducted due to national recommendations for restrictions regarding the COVID-19 Pandemic (e.g. social distancing) in an effort to limit this patient's exposure and mitigate transmission in our community.  Due to her co-morbid illnesses, this patient is at least at moderate risk for complications without adequate follow up.  This format is felt to be most appropriate for this patient at this time.  The patient did not have access to video technology/had technical difficulties with video requiring transitioning to audio format only (telephone).  All issues noted in this document were discussed and addressed.  No physical exam could be performed with this format.  Please refer to the patient's chart for her  consent to telehealth for [facility].   Date:  09/26/2019   ID:  Kristine Mata, DOB 06-Dec-1955, MRN [id number]  Patient Location: Home Provider Location: Office  PCP:  Lavella Lemons, PA  Cardiologist:  Kate Sable, MD  Electrophysiologist:  None   Evaluation Performed:  Follow-Up Visit  Chief Complaint:  PSVT  History of Present Illness:    Kristine Mata is a 63 y.o. female with a history of paroxysmal SVT.  I evaluated her once before in March 2019.  She experiences palpitations about 4 times per week but they only occur at night while she is sleeping.  She is sometimes awoken by the symptoms.  She denies exertional chest pain and dyspnea.  She has been trying to walk in order to lose weight.  She previously told me that her first episode of SVT occurred in 1987 and she was evaluated at [facility] at that time.  Social history: She previously worked at [employer] for 10 years and had been living in [place] at that time.  She moved from [place] to [place] in 2018.    Past Medical History:  Diagnosis Date  . Heart murmur   . Hypertension   . Insomnia, unspecified   . Right knee pain   . SVT  (supraventricular tachycardia) (HCC)    Past Surgical History:  Procedure Laterality Date  . ABDOMINAL HYSTERECTOMY    . TMJ ARTHROPLASTY       Current Meds  Medication Sig  . aspirin EC 81 MG tablet Take 81 mg by mouth daily.  . fluticasone (FLONASE) 50 MCG/ACT nasal spray Place 1 spray into both nostrils daily as needed.  . furosemide (LASIX) 40 MG tablet Take 40 mg by mouth daily.   . metoprolol (TOPROL-XL) 200 MG 24 hr tablet Take 200 mg by mouth daily.  . potassium chloride (K-DUR,KLOR-CON) 10 MEQ tablet Take 10 mEq by mouth daily.     Allergies:   Codeine   Social History   Tobacco Use  . Smoking status: Never Smoker  . Smokeless tobacco: Never Used  Substance Use Topics  . Alcohol use: Never    Frequency: Never  . Drug use: Never     Family Hx: The patient's family history includes Colon cancer in her sister; Diabetes in her sister; Heart disease in her brother and mother; Hypertension in her brother, brother, father, mother, sister, and sister; Thyroid disease in her brother and mother.  ROS:   Please see the history of present illness.     All other systems reviewed and are negative.   Prior CV studies:   The following studies were reviewed today:  Echo 05/04/18:  Study Conclusions  - Left ventricle: The cavity size was normal. There was  mild   concentric hypertrophy. Systolic function was normal. The   estimated ejection fraction was in the range of 60% to 65%. Wall   motion was normal; there were no regional wall motion   abnormalities. - Aortic valve: Mildly calcified annulus. Trileaflet; mildly   thickened leaflets. Sclerosis without stenosis. - Atrial septum: No defect or patent foramen ovale was identified.   Echo contrast study showed no right-to-left atrial level shunt.   Labs/Other Tests and Data Reviewed:    EKG:  No ECG reviewed.  Recent Labs: No results found for requested labs within last 8760 hours.   Recent Lipid Panel No  results found for: CHOL, TRIG, HDL, CHOLHDL, LDLCALC, LDLDIRECT  Wt Readings from Last 3 Encounters:  09/26/19 290 lb (131.5 kg)  03/17/18 295 lb (133.8 kg)  01/20/18 295 lb (133.8 kg)     Objective:    Vital Signs:  BP 140/82   Pulse 81   Ht 5\' 10"  (1.778 m)   Wt 290 lb (131.5 kg)   BMI 41.61 kg/m    VITAL SIGNS:  reviewed  ASSESSMENT & PLAN:    1.  Paroxysmal SVT/palpitations: Currently on Toprol-XL 200 mg daily but continues to experience symptoms about 4 times per week.  It is unclear if this is actually related to SVT as some episodes are associated with bad dreams.  I will obtain a 2-week event monitor.  2.  Hypertension: Blood pressure is borderline elevated.  No changes to therapy for now.   COVID-19 Education: The signs and symptoms of COVID-19 were discussed with the patient and how to seek care for testing (follow up with PCP or arrange E-visit).  The importance of social distancing was discussed today.  Time:   Today, I have spent 15 minutes with the patient with telehealth technology discussing the above problems.     Medication Adjustments/Labs and Tests Ordered: Current medicines are reviewed at length with the patient today.  Concerns regarding medicines are outlined above.   Tests Ordered: No orders of the defined types were placed in this encounter.   Medication Changes: No orders of the defined types were placed in this encounter.   Follow Up:  Either In Person or Virtual in 4 month(s)  Signed, , MD  09/26/2019 10:06 AM    [facility]

## 2019-09-26 NOTE — Patient Instructions (Signed)
Medication Instructions:  Continue all current medications.  Labwork: none  Testing/Procedures:  Your physician has recommended that you wear a 2 week event monitor. Event monitors are medical devices that record the heart's electrical activity. Doctors most often [place] these monitors to diagnose arrhythmias. Arrhythmias are problems with the speed or rhythm of the heartbeat. The monitor is a small, portable device. You can wear one while you do your normal daily activities. This is usually used to diagnose what is causing palpitations/syncope (passing out).  Office will contact with results via phone or letter.    Follow-Up: 4 months   Any Other Special Instructions Will Be Listed Below (If Applicable). The monitor will be mailed to your home from a company called [employer].  You will place on yourself, wear for the 2 weeks, & then mail back.   If you need a refill on your cardiac medications before your next appointment, please call your pharmacy.

## 2019-09-26 NOTE — Telephone Encounter (Signed)
Pre-cert Verification for the following procedure    [place]

## 2019-09-27 ENCOUNTER — Telehealth: Payer: BC Managed Care – PPO | Admitting: Cardiovascular Disease

## 2019-10-08 ENCOUNTER — Telehealth: Payer: Self-pay | Admitting: Cardiovascular Disease

## 2019-10-08 NOTE — Telephone Encounter (Signed)
Dr. Bronson Ing - see note below.  She returned the monitor due to feeling better.  Has VV scheduled for 02/04/2020 with SK.  Want to keep the same follow up?

## 2019-10-08 NOTE — Telephone Encounter (Signed)
Patient returning someone's call  Wanted [place] to know that since she stopped talking certain vitamin she has not had issues so she sent the monitor back

## 2019-10-09 NOTE — Telephone Encounter (Signed)
Can just do prn follow up.

## 2019-10-10 NOTE — Telephone Encounter (Signed)
Patient notified and verbalized understanding. 

## 2020-01-10 DIAGNOSIS — J0101 Acute recurrent maxillary sinusitis: Secondary | ICD-10-CM | POA: Diagnosis not present

## 2020-02-04 ENCOUNTER — Telehealth: Payer: BC Managed Care – PPO | Admitting: Cardiovascular Disease

## 2020-02-12 DIAGNOSIS — R1013 Epigastric pain: Secondary | ICD-10-CM | POA: Diagnosis not present

## 2020-02-12 DIAGNOSIS — Z20822 Contact with and (suspected) exposure to covid-19: Secondary | ICD-10-CM | POA: Diagnosis not present

## 2020-02-12 DIAGNOSIS — S29012A Strain of muscle and tendon of back wall of thorax, initial encounter: Secondary | ICD-10-CM | POA: Diagnosis not present

## 2020-02-12 DIAGNOSIS — R072 Precordial pain: Secondary | ICD-10-CM | POA: Diagnosis not present

## 2020-02-12 DIAGNOSIS — Z79899 Other long term (current) drug therapy: Secondary | ICD-10-CM | POA: Diagnosis not present

## 2020-02-12 DIAGNOSIS — S29019A Strain of muscle and tendon of unspecified wall of thorax, initial encounter: Secondary | ICD-10-CM | POA: Diagnosis not present

## 2020-02-12 DIAGNOSIS — R079 Chest pain, unspecified: Secondary | ICD-10-CM | POA: Diagnosis not present

## 2020-02-12 DIAGNOSIS — I10 Essential (primary) hypertension: Secondary | ICD-10-CM | POA: Diagnosis not present

## 2020-02-12 DIAGNOSIS — X58XXXA Exposure to other specified factors, initial encounter: Secondary | ICD-10-CM | POA: Diagnosis not present

## 2020-03-13 ENCOUNTER — Other Ambulatory Visit: Payer: Self-pay

## 2020-03-13 ENCOUNTER — Ambulatory Visit (INDEPENDENT_AMBULATORY_CARE_PROVIDER_SITE_OTHER): Payer: Medicare HMO | Admitting: Family Medicine

## 2020-03-13 ENCOUNTER — Encounter: Payer: Self-pay | Admitting: Family Medicine

## 2020-03-13 VITALS — BP 141/85 | HR 67 | Temp 97.5°F | Ht 70.0 in | Wt 314.6 lb

## 2020-03-13 DIAGNOSIS — I471 Supraventricular tachycardia: Secondary | ICD-10-CM | POA: Diagnosis not present

## 2020-03-13 DIAGNOSIS — I10 Essential (primary) hypertension: Secondary | ICD-10-CM

## 2020-03-13 DIAGNOSIS — E782 Mixed hyperlipidemia: Secondary | ICD-10-CM

## 2020-03-13 MED ORDER — ATORVASTATIN CALCIUM 20 MG PO TABS: 20.0000 mg | ORAL_TABLET | Freq: Every day | ORAL | 2 refills | Status: DC

## 2020-03-13 NOTE — Patient Instructions (Signed)
Fish oil or red yeast rice for high cholesterol.    High Cholesterol  High cholesterol is a condition in which the blood has high levels of a white, waxy, fat-like substance (cholesterol). The human body needs small amounts of cholesterol. The liver makes all the cholesterol that the body needs. Extra (excess) cholesterol comes from the food that we eat. Cholesterol is carried from the liver by the blood through the blood vessels. If you have high cholesterol, deposits (plaques) may build up on the walls of your blood vessels (arteries). Plaques make the arteries narrower and stiffer. Cholesterol plaques increase your risk for heart attack and stroke. Work with your health care provider to keep your cholesterol levels in a healthy range. What increases the risk? This condition is more likely to develop in people who:  Eat foods that are high in animal fat (saturated fat) or cholesterol.  Are overweight.  Are not getting enough exercise.  Have a family history of high cholesterol. What are the signs or symptoms? There are no symptoms of this condition. How is this diagnosed? This condition may be diagnosed from the results of a blood test.  If you are older than age 50, your health care provider may check your cholesterol every 4-6 years.  You may be checked more often if you already have high cholesterol or other risk factors for heart disease. The blood test for cholesterol measures:  "Bad" cholesterol (LDL cholesterol). This is the main type of cholesterol that causes heart disease. The desired level for LDL is less than 100.  "Good" cholesterol (HDL cholesterol). This type helps to protect against heart disease by cleaning the arteries and carrying the LDL away. The desired level for HDL is 60 or higher.  Triglycerides. These are fats that the body can store or burn for energy. The desired number for triglycerides is lower than 150.  Total cholesterol. This is a measure of the  total amount of cholesterol in your blood, including LDL cholesterol, HDL cholesterol, and triglycerides. A healthy number is less than 200. How is this treated? This condition is treated with diet changes, lifestyle changes, and medicines. Diet changes  This may include eating more whole grains, fruits, vegetables, nuts, and fish.  This may also include cutting back on red meat and foods that have a lot of added sugar. Lifestyle changes  Changes may include getting at least 40 minutes of aerobic exercise 3 times a week. Aerobic exercises include walking, biking, and swimming. Aerobic exercise along with a healthy diet can help you maintain a healthy weight.  Changes may also include quitting smoking. Medicines  Medicines are usually given if diet and lifestyle changes have failed to reduce your cholesterol to healthy levels.  Your health care provider may prescribe a statin medicine. Statin medicines have been shown to reduce cholesterol, which can reduce the risk of heart disease. Follow these instructions at home: Eating and drinking If told by your health care provider:  Eat chicken (without skin), fish, veal, shellfish, ground [place] breast, and round or loin cuts of red meat.  Do not eat fried foods or fatty meats, such as hot dogs and salami.  Eat plenty of fruits, such as apples.  Eat plenty of vegetables, such as broccoli, potatoes, and carrots.  Eat beans, peas, and lentils.  Eat grains such as barley, rice, couscous, and bulgur wheat.  Eat pasta without cream sauces.  Use skim or nonfat milk, and eat low-fat or nonfat yogurt and cheeses.  Do not eat or drink whole milk, cream, ice cream, egg yolks, or hard cheeses.  Do not eat stick margarine or tub margarines that contain trans fats (also called partially hydrogenated oils).  Do not eat saturated tropical oils, such as coconut oil and palm oil.  Do not eat cakes, cookies, crackers, or other baked goods that  contain trans fats.  General instructions  Exercise as directed by your health care provider. Increase your activity level with activities such as gardening, walking, and taking the stairs.  Take over-the-counter and prescription medicines only as told by your health care provider.  Do not use any products that contain nicotine or tobacco, such as cigarettes and e-cigarettes. If you need help quitting, ask your health care provider.  Keep all follow-up visits as told by your health care provider. This is important. Contact a health care provider if:  You are struggling to maintain a healthy diet or weight.  You need help to start on an exercise program.  You need help to stop smoking. Get help right away if:  You have chest pain.  You have trouble breathing. This information is not intended to replace advice given to you by your health care provider. Make sure you discuss any questions you have with your health care provider. Document Revised: 11/04/2017 Document Reviewed: 05/01/2016 Elsevier Patient Education  2020 [employer].

## 2020-03-13 NOTE — Progress Notes (Signed)
New Patient Office Visit  Assessment & Plan:  1. Mixed hyperlipidemia - Records requested from [facility]. Patient wants to know if medication is necessary, I am unable to answer this without lab results. I did advise she continue for now. Also discussed fish oil, red yeast rice, and dietary changes for high cholesterol.  - atorvastatin (LIPITOR) 20 MG tablet; Take 1 tablet (20 mg total) by mouth daily.  Dispense: 30 tablet; Refill: 2  2. PSVT (paroxysmal supraventricular tachycardia) ([place]) - Well controlled on current regimen.   3. Essential hypertension - Well controlled on current regimen.    Follow-up: Return in about 3 months (around 06/12/2020) for follow-up of chronic medication conditions.   Hendricks Limes, MSN, APRN, [name]-C [facility]  Subjective:  Patient ID: Kristine Mata, female    DOB: [birth year]  Age: 64 y.o. MRN: [id number]  Patient Care Team: Loman Brooklyn, [name] as PCP - General (Family Medicine) Herminio Commons, MD as PCP - Cardiology (Cardiology)  CC:  Chief Complaint  Patient presents with  . New Patient (Initial Visit)    [facility]   . Establish Care    HPI Kristine Mata presents to establish care. Patient is transferring care from [facility] in [place].   Patient reports she was at [facility] on March 30th where she was put on medication for high cholesterol.  She does not have records from that visit.   Review of Systems  Constitutional: Negative for chills, fever, malaise/fatigue and weight loss.  HENT: Negative for congestion, ear discharge, ear pain, nosebleeds, sinus pain, sore throat and tinnitus.   Eyes: Negative for blurred vision, double vision, pain, discharge and redness.  Respiratory: Negative for cough, shortness of breath and wheezing.   Cardiovascular: Negative for chest pain, palpitations and leg swelling.  Gastrointestinal: Negative for abdominal pain, constipation, diarrhea, heartburn,  nausea and vomiting.  Genitourinary: Negative for dysuria, frequency and urgency.  Musculoskeletal: Negative for myalgias.  Skin: Negative for rash.  Neurological: Negative for dizziness, seizures, weakness and headaches.  Psychiatric/Behavioral: Negative for depression, substance abuse and suicidal ideas. The patient is not nervous/anxious.     Current Outpatient Medications:  .  furosemide (LASIX) 40 MG tablet, Take 40 mg by mouth daily. , Disp: , Rfl:  .  metoprolol (TOPROL-XL) 200 MG 24 hr tablet, Take 200 mg by mouth daily., Disp: , Rfl:  .  potassium chloride (K-DUR,KLOR-CON) 10 MEQ tablet, Take 10 mEq by mouth daily., Disp: , Rfl:  .  atorvastatin (LIPITOR) 20 MG tablet, Take 1 tablet (20 mg total) by mouth daily., Disp: 30 tablet, Rfl: 2  Allergies  Allergen Reactions  . Codeine Nausea And Vomiting    Past Medical History:  Diagnosis Date  . Ectopic pregnancy   . Heart murmur   . Hyperlipidemia   . Hypertension   . Insomnia, unspecified   . Right knee pain   . SVT (supraventricular tachycardia) (HCC)     Past Surgical History:  Procedure Laterality Date  . ABDOMINAL HYSTERECTOMY    . cyst left breast     . SALPINGECTOMY Left   . TMJ ARTHROPLASTY      Family History  Problem Relation Age of Onset  . Heart disease Mother        used nitroglycerin  . Hypertension Mother   . Thyroid disease Mother   . Pancreatic cancer Mother   . Hypertension Father   . Diabetes Sister   . Hypertension Sister   .  Hypertension Brother   . Thyroid disease Brother   . Diabetes Brother   . Hypertension Sister   . Colon cancer Sister   . Hypertension Brother   . COPD Brother   . Heart disease Brother   . Stroke Brother   . COPD Brother   . Lung disease Brother   . Stroke Maternal Grandmother     Social History   Socioeconomic History  . Marital status: Legally Separated    Spouse name: Not on file  . Number of children: Not on file  . Years of education: Not on file   . Highest education level: Not on file  Occupational History  . Not on file  Tobacco Use  . Smoking status: Never Smoker  . Smokeless tobacco: Never Used  Substance and Sexual Activity  . Alcohol use: Never  . Drug use: Never  . Sexual activity: Not Currently  Other Topics Concern  . Not on file  Social History Narrative  . Not on file   Social Determinants of Health   Financial Resource Strain:   . Difficulty of Paying Living Expenses:   Food Insecurity:   . Worried About Programme researcher, broadcasting/film/video in the Last Year:   . Barista in the Last Year:   Transportation Needs:   . Freight forwarder (Medical):   Marland Kitchen Lack of Transportation (Non-Medical):   Physical Activity:   . Days of Exercise per Week:   . Minutes of Exercise per Session:   Stress:   . Feeling of Stress :   Social Connections:   . Frequency of Communication with Friends and Family:   . Frequency of Social Gatherings with Friends and Family:   . Attends Religious Services:   . Active Member of Clubs or Organizations:   . Attends Banker Meetings:   Marland Kitchen Marital Status:   Intimate Partner Violence:   . Fear of Current or Ex-Partner:   . Emotionally Abused:   Marland Kitchen Physically Abused:   . Sexually Abused:     Objective:   Today's Vitals: BP (!) 141/85   Pulse 67   Temp (!) 97.5 F (36.4 C) (Temporal)   Ht 5\' 10"  (1.778 m)   Wt (!) 314 lb 9.6 oz (142.7 kg)   SpO2 95%   BMI 45.14 kg/m   Physical Exam Vitals reviewed.  Constitutional:      General: She is not in acute distress.    Appearance: Normal appearance. She is morbidly obese. She is not ill-appearing, toxic-appearing or diaphoretic.  HENT:     Head: Normocephalic and atraumatic.  Eyes:     General: No scleral icterus.       Right eye: No discharge.        Left eye: No discharge.     Conjunctiva/sclera: Conjunctivae normal.  Cardiovascular:     Rate and Rhythm: Normal rate and regular rhythm.     Heart sounds: Normal heart  sounds. No murmur. No friction rub. No gallop.   Pulmonary:     Effort: Pulmonary effort is normal. No respiratory distress.     Breath sounds: Normal breath sounds. No stridor. No wheezing, rhonchi or rales.  Musculoskeletal:        General: Normal range of motion.     Cervical back: Normal range of motion.  Skin:    General: Skin is warm and dry.     Capillary Refill: Capillary refill takes less than 2 seconds.  Neurological:  General: No focal deficit present.     Mental Status: She is alert and oriented to person, place, and time. Mental status is at baseline.  Psychiatric:        Mood and Affect: Mood normal.        Behavior: Behavior normal.        Thought Content: Thought content normal.        Judgment: Judgment normal.

## 2020-03-19 ENCOUNTER — Telehealth: Payer: Self-pay | Admitting: Family Medicine

## 2020-03-19 NOTE — Telephone Encounter (Signed)
Please advise 

## 2020-03-19 NOTE — Telephone Encounter (Signed)
Melatonin (up to 10 mg), Unisom, B6, Zzzyquil

## 2020-03-19 NOTE — Telephone Encounter (Signed)
Patient notified and verbalized understanding. 

## 2020-05-15 ENCOUNTER — Telehealth: Payer: Self-pay | Admitting: Family Medicine

## 2020-05-15 ENCOUNTER — Ambulatory Visit (INDEPENDENT_AMBULATORY_CARE_PROVIDER_SITE_OTHER): Payer: Medicare HMO | Admitting: Family Medicine

## 2020-05-15 ENCOUNTER — Encounter: Payer: Self-pay | Admitting: Family Medicine

## 2020-05-15 ENCOUNTER — Other Ambulatory Visit: Payer: Self-pay

## 2020-05-15 VITALS — BP 167/96 | HR 67 | Temp 96.1°F | Ht 70.0 in | Wt 315.0 lb

## 2020-05-15 DIAGNOSIS — F419 Anxiety disorder, unspecified: Secondary | ICD-10-CM

## 2020-05-15 DIAGNOSIS — Z9189 Other specified personal risk factors, not elsewhere classified: Secondary | ICD-10-CM

## 2020-05-15 DIAGNOSIS — G4719 Other hypersomnia: Secondary | ICD-10-CM | POA: Diagnosis not present

## 2020-05-15 DIAGNOSIS — R002 Palpitations: Secondary | ICD-10-CM

## 2020-05-15 DIAGNOSIS — R0683 Snoring: Secondary | ICD-10-CM

## 2020-05-15 DIAGNOSIS — R69 Illness, unspecified: Secondary | ICD-10-CM | POA: Diagnosis not present

## 2020-05-15 MED ORDER — ESCITALOPRAM OXALATE 10 MG PO TABS: 10.0000 mg | ORAL_TABLET | Freq: Every day | ORAL | 2 refills | Status: DC

## 2020-05-15 NOTE — Telephone Encounter (Signed)
Does she not have good prescription coverage? I have never heard this before. It is $15 retail price at [facility].

## 2020-05-15 NOTE — Patient Instructions (Signed)

## 2020-05-15 NOTE — Telephone Encounter (Signed)
Pt cannot afford escitalopram (LEXAPRO) 10 MG tablet--it is $49 for a month supply.  Can Alona Bene send in another rx that would be cheaper for her. Please call back

## 2020-05-15 NOTE — Telephone Encounter (Signed)
Patient states that she has aetna/medicare for rx coverage.  Rx sent to [facility] for cheaper price

## 2020-05-15 NOTE — Progress Notes (Signed)
Assessment & Plan:  1-4. At risk for sleep apnea/Snoring/Excessive daytime sleepiness/Palpitations - Ambulatory referral to Pulmonology  5. Anxiety - Patient started on Lexapro 10 mg PO QD.    Follow up plan: Return as scheduled.  Deliah Boston, MSN, APRN, [name]-C [facility]  Subjective:   Patient ID: Kristine Mata, female    DOB: August 30, 1956, 64 y.o.   MRN: [id number]  HPI: Kristine Mata is a 64 y.o. female presenting on 05/15/2020 for Palpitations (Patient states that for the last year she will wake up in the middle of the night with heart palpatations after having a bad dream .  States that sometimes when she wakes up in the middle of the night she has not had a bad dream.)  The palpitations only occur at night; she never feels it during the day. She does sometimes wake up and gasp for air. She has been told she pauses breathing during the night. She does snore.   Height: 5\' 10"  (1.778 m)  Weight: 315 lbs BMI: Body mass index is 45.2 kg/m.  Neck circumference: 39 cm  1. Do you snore loudly (louder than talking or loud enough to be heard through closed doors)?  Yes 2. Do you often feel tired, fatigued, or sleepy during daytime?  Yes 3. Has anyone observed you stop breathing during your sleep?  Yes 4. Do you have or are you being treated for high blood pressure?  Yes 5. BMI more than 35 kg/m2?  Yes 6. Age over 50 years?  Yes 7. Neck circumference greater than 40 cm?  No 8. Female gender?  No  Total "Yes" Answers: 6   Patient feels she need something for anxiety. She has taken Zoloft in the past which made her feel loopy, crazy, and spaced out.    ROS: Negative unless specifically indicated above in HPI.   Relevant past medical history reviewed and updated as indicated.   Allergies and medications reviewed and updated.   Current Outpatient Medications:  .  furosemide (LASIX) 40 MG tablet, Take 40 mg by mouth daily. , Disp: , Rfl:    .  metoprolol (TOPROL-XL) 200 MG 24 hr tablet, Take 200 mg by mouth daily., Disp: , Rfl:  .  potassium chloride (K-DUR,KLOR-CON) 10 MEQ tablet, Take 10 mEq by mouth daily., Disp: , Rfl:   Allergies  Allergen Reactions  . Codeine Nausea And Vomiting    Objective:   BP (!) 167/96   Pulse 67   Temp (!) 96.1 F (35.6 C) (Temporal)   Ht 5\' 10"  (1.778 m)   Wt (!) 315 lb (142.9 kg)   SpO2 96%   BMI 45.20 kg/m    Physical Exam Vitals reviewed.  Constitutional:      General: She is not in acute distress.    Appearance: Normal appearance. She is morbidly obese. She is not ill-appearing, toxic-appearing or diaphoretic.  HENT:     Head: Normocephalic and atraumatic.  Eyes:     General: No scleral icterus.       Right eye: No discharge.        Left eye: No discharge.     Conjunctiva/sclera: Conjunctivae normal.  Cardiovascular:     Rate and Rhythm: Normal rate and regular rhythm.     Heart sounds: Normal heart sounds. No murmur heard.  No friction rub. No gallop.   Pulmonary:     Effort: Pulmonary effort is normal. No respiratory distress.     Breath sounds: Normal  breath sounds. No stridor. No wheezing, rhonchi or rales.  Musculoskeletal:        General: Normal range of motion.     Cervical back: Normal range of motion.  Skin:    General: Skin is warm and dry.     Capillary Refill: Capillary refill takes less than 2 seconds.  Neurological:     General: No focal deficit present.     Mental Status: She is alert and oriented to person, place, and time. Mental status is at baseline.  Psychiatric:        Mood and Affect: Mood normal.        Behavior: Behavior normal.        Thought Content: Thought content normal.        Judgment: Judgment normal.

## 2020-05-26 ENCOUNTER — Other Ambulatory Visit: Payer: Self-pay | Admitting: Family Medicine

## 2020-05-26 NOTE — Telephone Encounter (Signed)
  Prescription Request  05/26/2020  What is the name of the medication or equipment? metoprolol (TOPROL-XL) 200 MG 24 hr tablet, furosemide (LASIX) 40 MG tablet, potassium chloride (K-DUR,KLOR-CON) 10 MEQ tablet  Have you contacted your pharmacy to request a refill? (if applicable) yes  Which pharmacy would you like this sent to? [facility]   Patient notified that their request is being sent to the clinical staff for review and that they should receive a response within 2 business days.

## 2020-05-28 MED ORDER — FUROSEMIDE 40 MG PO TABS: 40.0000 mg | ORAL_TABLET | Freq: Every day | ORAL | 0 refills | Status: DC

## 2020-05-28 MED ORDER — METOPROLOL SUCCINATE ER 200 MG PO TB24: 200.0000 mg | ORAL_TABLET | Freq: Every day | ORAL | 0 refills | Status: DC

## 2020-05-28 MED ORDER — POTASSIUM CHLORIDE CRYS ER 10 MEQ PO TBCR: 10.0000 meq | EXTENDED_RELEASE_TABLET | Freq: Every day | ORAL | 0 refills | Status: DC

## 2020-05-28 NOTE — Telephone Encounter (Signed)
Pt called back to get update on if her medicines were sent to the pharmacy yet. Pt requesting that her meds be sent to [facility] in [place] asap because she only has 4 days left.

## 2020-05-28 NOTE — Telephone Encounter (Signed)
Patient aware and verbalized understanding. °

## 2020-05-28 NOTE — Telephone Encounter (Signed)
Patient aware rx was sent to pharmacy. 

## 2020-06-04 ENCOUNTER — Telehealth: Payer: Self-pay | Admitting: Family Medicine

## 2020-06-04 NOTE — Telephone Encounter (Signed)
  REFERRAL REQUEST Telephone Note 06/04/2020  What type of referral do you need? Sleep Study  Why do you need this referral? Sleep apnea  Have you been seen at our office for this problem? Yes (Advise that they may need an appointment with their PCP before a referral can be done)  Is there a particular doctor or location that you prefer? [facility]  Patient notified that referrals can take up to a week or longer to process. If they haven't heard anything within a week they should call back and speak with the referral department.   Joyce's pt.  Please call pt.

## 2020-06-05 NOTE — Telephone Encounter (Signed)
Referral for pulmonology has been placed and they will do sleep study

## 2020-06-06 ENCOUNTER — Other Ambulatory Visit: Payer: Self-pay | Admitting: Family Medicine

## 2020-06-06 DIAGNOSIS — E782 Mixed hyperlipidemia: Secondary | ICD-10-CM

## 2020-06-11 ENCOUNTER — Encounter: Payer: Self-pay | Admitting: *Deleted

## 2020-06-12 ENCOUNTER — Ambulatory Visit: Payer: Medicare HMO | Admitting: Family Medicine

## 2020-06-23 ENCOUNTER — Other Ambulatory Visit: Payer: Self-pay | Admitting: Family Medicine

## 2020-07-07 ENCOUNTER — Other Ambulatory Visit: Payer: Self-pay | Admitting: Family Medicine

## 2020-07-07 DIAGNOSIS — Z1231 Encounter for screening mammogram for malignant neoplasm of breast: Secondary | ICD-10-CM

## 2020-07-09 ENCOUNTER — Other Ambulatory Visit: Payer: Self-pay

## 2020-07-09 ENCOUNTER — Ambulatory Visit
Admission: RE | Admit: 2020-07-09 | Discharge: 2020-07-09 | Disposition: A | Discharge status: Home / Self Care | Payer: Medicare HMO | Source: Ambulatory Visit | Attending: Family Medicine | Admitting: Family Medicine

## 2020-07-09 DIAGNOSIS — Z1231 Encounter for screening mammogram for malignant neoplasm of breast: Secondary | ICD-10-CM

## 2020-07-15 ENCOUNTER — Ambulatory Visit (INDEPENDENT_AMBULATORY_CARE_PROVIDER_SITE_OTHER): Payer: Medicare HMO | Admitting: Family Medicine

## 2020-07-15 ENCOUNTER — Other Ambulatory Visit: Payer: Self-pay

## 2020-07-15 ENCOUNTER — Encounter: Payer: Self-pay | Admitting: Family Medicine

## 2020-07-15 VITALS — BP 142/87 | HR 57 | Temp 97.5°F | Ht 70.0 in | Wt 307.4 lb

## 2020-07-15 DIAGNOSIS — I471 Supraventricular tachycardia: Secondary | ICD-10-CM

## 2020-07-15 DIAGNOSIS — Z9189 Other specified personal risk factors, not elsewhere classified: Secondary | ICD-10-CM | POA: Diagnosis not present

## 2020-07-15 DIAGNOSIS — I10 Essential (primary) hypertension: Secondary | ICD-10-CM | POA: Diagnosis not present

## 2020-07-15 DIAGNOSIS — F419 Anxiety disorder, unspecified: Secondary | ICD-10-CM

## 2020-07-15 DIAGNOSIS — R69 Illness, unspecified: Secondary | ICD-10-CM | POA: Diagnosis not present

## 2020-07-15 MED ORDER — METOPROLOL SUCCINATE ER 200 MG PO TB24: 200.0000 mg | ORAL_TABLET | Freq: Every day | ORAL | 1 refills | Status: DC

## 2020-07-15 MED ORDER — FUROSEMIDE 40 MG PO TABS: 40.0000 mg | ORAL_TABLET | Freq: Every day | ORAL | 1 refills | Status: DC

## 2020-07-15 MED ORDER — POTASSIUM CHLORIDE CRYS ER 10 MEQ PO TBCR: 10.0000 meq | EXTENDED_RELEASE_TABLET | Freq: Every day | ORAL | 1 refills | Status: DC

## 2020-07-15 NOTE — Progress Notes (Signed)
Assessment & Plan:  1. PSVT (paroxysmal supraventricular tachycardia) ([place]) - Well controlled on current regimen.  - metoprolol (TOPROL-XL) 200 MG 24 hr tablet; Take 1 tablet (200 mg total) by mouth daily.  Dispense: 90 tablet; Refill: 1  2. Essential hypertension - Well controlled on current regimen.  - furosemide (LASIX) 40 MG tablet; Take 1 tablet (40 mg total) by mouth daily.  Dispense: 90 tablet; Refill: 1 - metoprolol (TOPROL-XL) 200 MG 24 hr tablet; Take 1 tablet (200 mg total) by mouth daily.  Dispense: 90 tablet; Refill: 1 - potassium chloride (KLOR-CON) 10 MEQ tablet; Take 1 tablet (10 mEq total) by mouth daily.  Dispense: 90 tablet; Refill: 1 - CBC with Differential/Platelet - CMP14+EGFR - Lipid panel  3. At risk for sleep apnea - Patient to keep upcoming appointment with pulmonology.  4. Anxiety - Patient does not desire medication.  5. Morbid obesity ([place]) - Patient has lost 8 lbs since last month.  Encouraged her to continue diet and exercise.   Return in about 3 months (around 10/14/2020) for follow-up of chronic medication conditions.  Hendricks Limes, MSN, APRN, [name]-C [facility]  Subjective:    Patient ID: Kristine Mata, female    DOB: 18-May-1956, 64 y.o.   MRN: [id number]  Patient Care Team: Loman Brooklyn, [name] as PCP - General (Family Medicine) Herminio Commons, MD (Inactive) as PCP - Cardiology (Cardiology)   Chief Complaint:  Chief Complaint  Patient presents with   Hyperlipidemia    check up of chronic medical conditions    HPI: Kristine Mata is a 64 y.o. female presenting on 07/15/2020 for Hyperlipidemia (check up of chronic medical conditions)  Patient was referred to pulmonology for a sleep study.  Her appointment is scheduled for 08/11/2020.  She was started on Lexapro 10 mg once daily for anxiety almost 2 months ago.  She reports she took the Lexapro for 2 weeks but stopped it due to it causing headaches  and making her feel sleepy the next day.  She has also failed therapy with Zoloft.  She does not desire another medication at this time.  GAD 7 : Generalized Anxiety Score 07/15/2020 03/13/2020  Nervous, Anxious, on Edge 0 0  Control/stop worrying 0 0  Worry too much - different things 1 1  Trouble relaxing 1 1  Restless 0 0  Easily annoyed or irritable 0 0  Afraid - awful might happen 0 0  Total GAD 7 Score 2 2   Depression screen [facility] 2/9 07/15/2020 05/15/2020 03/13/2020  Decreased Interest 0 0 1  Down, Depressed, Hopeless 0 0 0  PHQ - 2 Score 0 0 1  Altered sleeping 1 - 1  Tired, decreased energy 0 - 1  Change in appetite 1 - 1  Feeling bad or failure about yourself  0 - 1  Trouble concentrating 0 - 0  Moving slowly or fidgety/restless 0 - 0  Suicidal thoughts 0 - 0  PHQ-9 Score 2 - 5    New complaints: None  Social history:  Relevant past medical, surgical, family and social history reviewed and updated as indicated. Interim medical history since our last visit reviewed.  Allergies and medications reviewed and updated.  DATA REVIEWED: CHART IN EPIC  ROS: Negative unless specifically indicated above in HPI.    Current Outpatient Medications:    furosemide (LASIX) 40 MG tablet, TAKE 1 TABLET BY MOUTH EVERY DAY, Disp: 30 tablet, Rfl: 0   metoprolol (TOPROL-XL) 200  MG 24 hr tablet, TAKE 1 TABLET BY MOUTH EVERY DAY, Disp: 30 tablet, Rfl: 0   potassium chloride (KLOR-CON) 10 MEQ tablet, Take 1 tablet (10 mEq total) by mouth daily., Disp: 30 tablet, Rfl: 0   Allergies  Allergen Reactions   Codeine Nausea And Vomiting   Past Medical History:  Diagnosis Date   Ectopic pregnancy    Heart murmur    Hyperlipidemia    Hypertension    Insomnia, unspecified    Right knee pain    SVT (supraventricular tachycardia) (HCC)     Past Surgical History:  Procedure Laterality Date   ABDOMINAL HYSTERECTOMY     cyst left breast      SALPINGECTOMY Left    TMJ  ARTHROPLASTY      Social History   Socioeconomic History   Marital status: Legally Separated    Spouse name: Not on file   Number of children: Not on file   Years of education: Not on file   Highest education level: Not on file  Occupational History   Not on file  Tobacco Use   Smoking status: Never Smoker   Smokeless tobacco: Never Used  Vaping Use   Vaping Use: Never used  Substance and Sexual Activity   Alcohol use: Never   Drug use: Never   Sexual activity: Not Currently  Other Topics Concern   Not on file  Social History Narrative   Not on file   Social Determinants of Health   Financial Resource Strain:    Difficulty of Paying Living Expenses: Not on file  Food Insecurity:    Worried About Running Out of Food in the Last Year: Not on file   Ran Out of Food in the Last Year: Not on file  Transportation Needs:    Lack of Transportation (Medical): Not on file   Lack of Transportation (Non-Medical): Not on file  Physical Activity:    Days of Exercise per Week: Not on file   Minutes of Exercise per Session: Not on file  Stress:    Feeling of Stress : Not on file  Social Connections:    Frequency of Communication with Friends and Family: Not on file   Frequency of Social Gatherings with Friends and Family: Not on file   Attends Religious Services: Not on file   Active Member of Clubs or Organizations: Not on file   Attends Archivist Meetings: Not on file   Marital Status: Not on file  Intimate Partner Violence:    Fear of Current or Ex-Partner: Not on file   Emotionally Abused: Not on file   Physically Abused: Not on file   Sexually Abused: Not on file        Objective:    BP (!) 142/87    Pulse (!) 57    Temp (!) 97.5 F (36.4 C) (Temporal)    Ht _0  (1.778 m)    Wt (!) 307 lb 6.4 oz (139.4 kg)    SpO2 94%    BMI 44.11 kg/m   Wt Readings from Last 3 Encounters:  07/15/20 (!) 307 lb 6.4 oz (139.4 kg)   05/15/20 (!) 315 lb (142.9 kg)  03/13/20 (!) 314 lb 9.6 oz (142.7 kg)    Physical Exam Vitals reviewed.  Constitutional:      General: She is not in acute distress.    Appearance: Normal appearance. She is morbidly obese. She is not ill-appearing, toxic-appearing or diaphoretic.  HENT:     Head:  Normocephalic and atraumatic.  Eyes:     General: No scleral icterus.       Right eye: No discharge.        Left eye: No discharge.     Conjunctiva/sclera: Conjunctivae normal.  Cardiovascular:     Rate and Rhythm: Normal rate and regular rhythm.     Heart sounds: Normal heart sounds. No murmur heard.  No friction rub. No gallop.   Pulmonary:     Effort: Pulmonary effort is normal. No respiratory distress.     Breath sounds: Normal breath sounds. No stridor. No wheezing, rhonchi or rales.  Musculoskeletal:        General: Normal range of motion.     Cervical back: Normal range of motion.  Skin:    General: Skin is warm and dry.     Capillary Refill: Capillary refill takes less than 2 seconds.  Neurological:     General: No focal deficit present.     Mental Status: She is alert and oriented to person, place, and time. Mental status is at baseline.  Psychiatric:        Mood and Affect: Mood normal.        Behavior: Behavior normal.        Thought Content: Thought content normal.        Judgment: Judgment normal.     No results found for: TSH Lab Results  Component Value Date   WBC 5.2 03/17/2018   HGB 12.6 03/17/2018   HCT 40.3 03/17/2018   MCV 91.6 03/17/2018   PLT 299 03/17/2018   Lab Results  Component Value Date   NA 141 03/17/2018   K 4.0 03/17/2018   CO2 25 03/17/2018   GLUCOSE 122 (H) 03/17/2018   BUN 17 03/17/2018   CREATININE 0.56 03/17/2018   CALCIUM 10.3 03/17/2018   ANIONGAP 12 03/17/2018   No results found for: CHOL No results found for: HDL No results found for: LDLCALC No results found for: TRIG No results found for: CHOLHDL No results found for:  HGBA1C

## 2020-07-16 ENCOUNTER — Other Ambulatory Visit: Payer: Self-pay | Admitting: Family Medicine

## 2020-07-16 ENCOUNTER — Telehealth: Payer: Self-pay | Admitting: Family Medicine

## 2020-07-16 DIAGNOSIS — I10 Essential (primary) hypertension: Secondary | ICD-10-CM

## 2020-07-16 DIAGNOSIS — I471 Supraventricular tachycardia: Secondary | ICD-10-CM

## 2020-07-16 DIAGNOSIS — E782 Mixed hyperlipidemia: Secondary | ICD-10-CM

## 2020-07-16 LAB — LIPID PANEL
Chol/HDL Ratio: 2.8 ratio (ref 0.0–4.4)
Cholesterol, Total: 203 mg/dL — ABNORMAL HIGH (ref 100–199)
HDL: 72 mg/dL (ref >39)
LDL Chol Calc (NIH): 117 mg/dL — ABNORMAL HIGH (ref 0–99)
Triglycerides: 79 mg/dL (ref 0–149)
VLDL Cholesterol Cal: 14 mg/dL (ref 5–40)

## 2020-07-16 LAB — CBC WITH DIFFERENTIAL/PLATELET
Basophils Absolute: 0 10*3/uL (ref 0.0–0.2)
Basos: 1 %
EOS (ABSOLUTE): 0.1 10*3/uL (ref 0.0–0.4)
Eos: 2 %
Hematocrit: 38.2 % (ref 34.0–46.6)
Hemoglobin: 12.7 g/dL (ref 11.1–15.9)
Immature Grans (Abs): 0 10*3/uL (ref 0.0–0.1)
Immature Granulocytes: 0 %
Lymphocytes Absolute: 3 10*3/uL (ref 0.7–3.1)
Lymphs: 48 %
MCH: 29.4 pg (ref 26.6–33.0)
MCHC: 33.2 g/dL (ref 31.5–35.7)
MCV: 88 fL (ref 79–97)
Monocytes Absolute: 0.6 10*3/uL (ref 0.1–0.9)
Monocytes: 9 %
Neutrophils Absolute: 2.6 10*3/uL (ref 1.4–7.0)
Neutrophils: 40 %
Platelets: 307 10*3/uL (ref 150–450)
RBC: 4.32 x10E6/uL (ref 3.77–5.28)
RDW: 13.7 % (ref 11.7–15.4)
WBC: 6.3 10*3/uL (ref 3.4–10.8)

## 2020-07-16 LAB — CMP14+EGFR
ALT: 12 IU/L (ref 0–32)
AST: 22 IU/L (ref 0–40)
Albumin/Globulin Ratio: 1.7 (ref 1.2–2.2)
Albumin: 4.6 g/dL (ref 3.8–4.8)
Alkaline Phosphatase: 150 IU/L — ABNORMAL HIGH (ref 48–121)
BUN/Creatinine Ratio: 15 (ref 12–28)
BUN: 10 mg/dL (ref 8–27)
Bilirubin Total: 0.3 mg/dL (ref 0.0–1.2)
CO2: 27 mmol/L (ref 20–29)
Calcium: 10.6 mg/dL — ABNORMAL HIGH (ref 8.7–10.3)
Chloride: 104 mmol/L (ref 96–106)
Creatinine, Ser: 0.68 mg/dL (ref 0.57–1.00)
GFR calc Af Amer: 106 mL/min/{1.73_m2} (ref >59)
GFR calc non Af Amer: 92 mL/min/{1.73_m2} (ref >59)
Globulin, Total: 2.7 g/dL (ref 1.5–4.5)
Glucose: 102 mg/dL — ABNORMAL HIGH (ref 65–99)
Potassium: 4.4 mmol/L (ref 3.5–5.2)
Sodium: 143 mmol/L (ref 134–144)
Total Protein: 7.3 g/dL (ref 6.0–8.5)

## 2020-07-16 MED ORDER — ROSUVASTATIN CALCIUM 5 MG PO TABS: 5.0000 mg | ORAL_TABLET | Freq: Every day | ORAL | 2 refills | Status: DC

## 2020-07-16 NOTE — Telephone Encounter (Signed)
On labs

## 2020-07-16 NOTE — Telephone Encounter (Signed)
  Prescription Request  07/16/2020  What is the name of the medication or equipment? crestor  Have you contacted your pharmacy to request a refill? (if applicable) no  Which pharmacy would you like this sent to? [facility]   Patient notified that their request is being sent to the clinical staff for review and that they should receive a response within 2 business days.

## 2020-07-25 ENCOUNTER — Telehealth: Payer: Self-pay | Admitting: Family Medicine

## 2020-07-25 DIAGNOSIS — E782 Mixed hyperlipidemia: Secondary | ICD-10-CM

## 2020-07-27 DIAGNOSIS — E782 Mixed hyperlipidemia: Secondary | ICD-10-CM | POA: Insufficient documentation

## 2020-07-27 MED ORDER — PRAVASTATIN SODIUM 20 MG PO TABS: 20.0000 mg | ORAL_TABLET | Freq: Every day | ORAL | 2 refills | Status: DC

## 2020-07-27 NOTE — Telephone Encounter (Signed)
Let's stop the Crestor. I will send a different statin to the pharmacy but lets let symptoms resolve (few days) before starting it.

## 2020-07-28 NOTE — Telephone Encounter (Signed)
Patient aware and verbalized understanding. °

## 2020-08-11 ENCOUNTER — Institutional Professional Consult (permissible substitution): Payer: BC Managed Care – PPO | Admitting: Pulmonary Disease

## 2020-08-12 ENCOUNTER — Institutional Professional Consult (permissible substitution): Payer: Medicare HMO | Admitting: Pulmonary Disease

## 2020-09-10 ENCOUNTER — Ambulatory Visit: Payer: Medicare HMO | Admitting: Nurse Practitioner

## 2020-09-29 ENCOUNTER — Ambulatory Visit: Payer: Medicare HMO | Admitting: Nurse Practitioner

## 2020-10-03 ENCOUNTER — Ambulatory Visit: Payer: Medicare HMO | Admitting: Nurse Practitioner

## 2020-10-15 ENCOUNTER — Encounter: Payer: Medicare HMO | Admitting: Family Medicine

## 2020-10-27 ENCOUNTER — Ambulatory Visit (INDEPENDENT_AMBULATORY_CARE_PROVIDER_SITE_OTHER): Payer: Medicare HMO | Admitting: Nurse Practitioner

## 2020-10-27 ENCOUNTER — Other Ambulatory Visit: Payer: Self-pay

## 2020-10-27 ENCOUNTER — Encounter: Payer: Self-pay | Admitting: Nurse Practitioner

## 2020-10-27 VITALS — BP 133/77 | HR 66 | Temp 97.0°F | Resp 20 | Ht 70.0 in | Wt 314.0 lb

## 2020-10-27 DIAGNOSIS — H6502 Acute serous otitis media, left ear: Secondary | ICD-10-CM

## 2020-10-27 DIAGNOSIS — H9202 Otalgia, left ear: Secondary | ICD-10-CM | POA: Insufficient documentation

## 2020-10-27 MED ORDER — FLUTICASONE PROPIONATE 50 MCG/ACT NA SUSP: 2.0000 | Freq: Every day | NASAL | 6 refills | Status: DC

## 2020-10-27 MED ORDER — AMOXICILLIN 875 MG PO TABS: 875.0000 mg | ORAL_TABLET | Freq: Two times a day (BID) | ORAL | 0 refills | Status: DC

## 2020-10-27 NOTE — Patient Instructions (Signed)

## 2020-10-27 NOTE — Assessment & Plan Note (Signed)
Left ear pain not well managed.  Started patient on amoxicillin 875 mg tablet twice daily for 7 days. Claritin, avoid Q-tips in the ear, Tylenol for pain as tolerated.  Follow-up with worsening or unresolved symptoms  Education provided with printed handouts given  Rx sent to pharmacy.

## 2020-10-27 NOTE — Progress Notes (Signed)
Acute Office Visit  Subjective:    Patient ID: Kristine Mata, female    DOB: [birth year], 64 y.o.   MRN: [id number]  Chief Complaint  Patient presents with   left ear pain    X 2 weeks     Otalgia  There is pain in the left ear. This is a new problem. The current episode started 1 to 4 weeks ago. The problem has been unchanged. There has been no fever. The pain is at a severity of 6/10. Associated symptoms include ear discharge and neck pain. Pertinent negatives include no coughing, headaches, hearing loss, rhinorrhea or sore throat. Associated symptoms comments: Swollen left lymph node. She has tried nothing for the symptoms. There is no history of a chronic ear infection or hearing loss.    Past Medical History:  Diagnosis Date   Ectopic pregnancy    Heart murmur    Hyperlipidemia    Hypertension    Insomnia, unspecified    Right knee pain    SVT (supraventricular tachycardia) (H[facility])     Past Surgical History:  Procedure Laterality Date   ABDOMINAL HYSTERECTOMY     cyst left breast      SALPINGECTOMY Left    TMJ ARTHROPLASTY      Family History  Problem Relation Age of Onset   Heart disease Mother        used nitroglycerin   Hypertension Mother    Thyroid disease Mother    Pancreatic cancer Mother    Hypertension Father    Diabetes Sister    Hypertension Sister    Hypertension Brother    Thyroid disease Brother    Diabetes Brother    Hypertension Sister    Colon cancer Sister    Hypertension Brother    COPD Brother    Heart disease Brother    Stroke Brother    COPD Brother    Lung disease Brother    Stroke Maternal Grandmother     Social History   Socioeconomic History   Marital status: Legally Separated    Spouse name: Not on file   Number of children: Not on file   Years of education: Not on file   Highest education level: Not on file  Occupational History   Not on file  Tobacco Use   Smoking status:  Never Smoker   Smokeless tobacco: Never Used  Building services engineer Use: Never used  Substance and Sexual Activity   Alcohol use: Never   Drug use: Never   Sexual activity: Not Currently  Other Topics Concern   Not on file  Social History Narrative   Not on file   Social Determinants of Health   Financial Resource Strain: Not on file  Food Insecurity: Not on file  Transportation Needs: Not on file  Physical Activity: Not on file  Stress: Not on file  Social Connections: Not on file  Intimate Partner Violence: Not on file    Outpatient Medications Prior to Visit  Medication Sig Dispense Refill   furosemide (LASIX) 40 MG tablet Take 1 tablet (40 mg total) by mouth daily. 90 tablet 1   metoprolol (TOPROL-XL) 200 MG 24 hr tablet Take 1 tablet (200 mg total) by mouth daily. 90 tablet 1   potassium chloride (KLOR-CON) 10 MEQ tablet Take 1 tablet (10 mEq total) by mouth daily. 90 tablet 1   pravastatin (PRAVACHOL) 20 MG tablet Take 1 tablet (20 mg total) by mouth daily. 30 tablet 2  No facility-administered medications prior to visit.    Allergies  Allergen Reactions   Codeine Nausea And Vomiting    Review of Systems  HENT: Positive for ear discharge and ear pain. Negative for facial swelling, hearing loss, postnasal drip, rhinorrhea, sinus pressure, sinus pain, sneezing and sore throat.   Respiratory: Negative for cough.   Musculoskeletal: Positive for neck pain.  Neurological: Negative for headaches.  All other systems reviewed and are negative.      Objective:    Physical Exam Vitals reviewed.  Constitutional:      General: She is awake.     Appearance: Normal appearance. She is obese.     Interventions: Face mask in place.  HENT:     Head: Normocephalic.     Comments: Left ear pain    Right Ear: External ear normal. There is no impacted cerumen.     Left Ear: External ear normal. There is no impacted cerumen.     Mouth/Throat:     Mouth: Mucous  membranes are moist.     Pharynx: Oropharynx is clear. No oropharyngeal exudate.  Eyes:     Conjunctiva/sclera: Conjunctivae normal.  Cardiovascular:     Rate and Rhythm: Normal rate and regular rhythm.     Pulses: Normal pulses.     Heart sounds: Normal heart sounds.  Pulmonary:     Effort: Pulmonary effort is normal.     Breath sounds: Normal breath sounds.  Abdominal:     General: Bowel sounds are normal.  Neurological:     Mental Status: She is alert and oriented to person, place, and time.  Psychiatric:        Behavior: Behavior normal. Behavior is cooperative.     BP 133/77    Pulse 66    Temp (!) 97 F (36.1 C)    Resp 20    Ht 5\' 10"  (1.778 m)    Wt (!) 314 lb (142.4 kg)    SpO2 99%    BMI 45.05 kg/m  Wt Readings from Last 3 Encounters:  10/27/20 (!) 314 lb (142.4 kg)  07/15/20 (!) 307 lb 6.4 oz (139.4 kg)  05/15/20 (!) 315 lb (142.9 kg)    Health Maintenance Due  Topic Date Due   COVID-19 Vaccine (1) Never done   DEXA SCAN  Never done   INFLUENZA VACCINE  06/15/2020    There are no preventive care reminders to display for this patient.   No results found for: TSH Lab Results  Component Value Date   WBC 6.3 07/15/2020   HGB 12.7 07/15/2020   HCT 38.2 07/15/2020   MCV 88 07/15/2020   PLT 307 07/15/2020   Lab Results  Component Value Date   NA 143 07/15/2020   K 4.4 07/15/2020   CO2 27 07/15/2020   GLUCOSE 102 (H) 07/15/2020   BUN 10 07/15/2020   CREATININE 0.68 07/15/2020   BILITOT 0.3 07/15/2020   ALKPHOS 150 (H) 07/15/2020   AST 22 07/15/2020   ALT 12 07/15/2020   PROT 7.3 07/15/2020   ALBUMIN 4.6 07/15/2020   CALCIUM 10.6 (H) 07/15/2020   ANIONGAP 12 03/17/2018   Lab Results  Component Value Date   CHOL 203 (H) 07/15/2020   Lab Results  Component Value Date   HDL 72 07/15/2020   Lab Results  Component Value Date   LDLCALC 117 (H) 07/15/2020   Lab Results  Component Value Date   TRIG 79 07/15/2020   Lab Results  Component  Value  Date   CHOLHDL 2.8 07/15/2020   No results found for: HGBA1C     Assessment & Plan:   Problem List Items Addressed This Visit      Nervous and Auditory   Non-recurrent acute serous otitis media of left ear - Primary   Relevant Medications   amoxicillin (AMOXIL) 875 MG tablet     Other   Otalgia, left    Left ear pain not well managed.  Started patient on amoxicillin 875 mg tablet twice daily for 7 days. Claritin, avoid Q-tips in the ear, Tylenol for pain as tolerated.  Follow-up with worsening or unresolved symptoms  Education provided with printed handouts given  Rx sent to pharmacy.          Meds ordered this encounter  Medications   amoxicillin (AMOXIL) 875 MG tablet    Sig: Take 1 tablet (875 mg total) by mouth 2 (two) times daily.    Dispense:  14 tablet    Refill:  0    Order Specific Question:   Supervising Provider    Answer:   Arville Care A [[id number]]   fluticasone (FLONASE) 50 MCG/ACT nasal spray    Sig: Place 2 sprays into both nostrils daily.    Dispense:  16 g    Refill:  6    Order Specific Question:   Supervising Provider    Answer:   Arville Care A [[id number]]     Daryll Drown, NP

## 2020-11-05 ENCOUNTER — Telehealth: Payer: Self-pay

## 2020-11-05 NOTE — Telephone Encounter (Signed)
Patient aware and verbalizes understanding. 

## 2020-11-05 NOTE — Telephone Encounter (Signed)
  Incoming Patient Call  11/05/2020  What symptoms do you have? Earache, headache   How long have you been sick? About a week ago  Have you been seen for this problem? Yes last week she has finished taking her medication it is not hurting as bad but her left ear the one she was told had fluid in it is the on that still is hurting would like to know if she can get ear drops?  If your provider decides to give you a prescription, which pharmacy would you like for it to be sent to? [facility]    Patient informed that this information will be sent to the clinical staff for review and that they should receive a follow up call.

## 2020-11-05 NOTE — Telephone Encounter (Signed)
Needs flonase nasal spray and oral decongestant

## 2021-01-02 ENCOUNTER — Ambulatory Visit (INDEPENDENT_AMBULATORY_CARE_PROVIDER_SITE_OTHER): Payer: Medicare HMO | Admitting: Nurse Practitioner

## 2021-01-02 DIAGNOSIS — M79605 Pain in left leg: Secondary | ICD-10-CM | POA: Diagnosis not present

## 2021-01-02 MED ORDER — NAPROXEN 500 MG PO TABS: 500.0000 mg | ORAL_TABLET | Freq: Two times a day (BID) | ORAL | 0 refills | Status: DC

## 2021-01-02 NOTE — Assessment & Plan Note (Signed)
Left lower extremity not well controlled.  Patient reports worsening pain after using at home exercise cycling bicycle.  Reports pain 6 out of 10 on the pain scale of 0-10.  Advised patient to rest joint, apply ice, and use anti-inflammatory.  Naproxen 500 mg ordered twice daily as needed with food.  Follow-up with unresolved or worsening symptoms. Rx sent to pharmacy

## 2021-01-02 NOTE — Progress Notes (Signed)
   Virtual Visit via telephone Note Due to COVID-19 pandemic this visit was conducted virtually. This visit type was conducted due to national recommendations for restrictions regarding the COVID-19 Pandemic (e.g. social distancing, sheltering in place) in an effort to limit this patient's exposure and mitigate transmission in our community. All issues noted in this document were discussed and addressed.  A physical exam was not performed with this format.  I connected with Kristine Mata on 01/02/21 at  9:40 AM by telephone and verified that I am speaking with the correct person using two identifiers. Kristine Mata is currently located at home during visit. The provider, Daryll Drown, NP is located in their office at time of visit.  I discussed the limitations, risks, security and privacy concerns of performing an evaluation and management service by telephone and the availability of in person appointments. I also discussed with the patient that there may be a patient responsible charge related to this service. The patient expressed understanding and agreed to proceed.   History and Present Illness:  HPI  Pain  She reports new onset left leg pain. was not an injury that may have caused the pain. The pain started a few days ago and is staying constant. The pain does not radiate . The pain is described as aching, is 6/10 in intensity, occurring constantly. Symptoms are worse in the: morning, mid-day, afternoon, evening  Aggravating factors: Exercising on at home bicycle Relieving factors: bending backwards, bending forwards and walking.  She has tried acetaminophen with no relief.   ---------------------------------------------------------------------------------------------------  Review of Systems  Constitutional: Negative for chills, fever and weight loss.  HENT: Negative.   Respiratory: Negative.   Cardiovascular: Negative.   Genitourinary: Negative.   Musculoskeletal:  Positive for joint pain.  Skin: Negative for itching and rash.  Neurological: Negative.   All other systems reviewed and are negative.    Observations/Objective: Televisit-patient did not sound to be in distress.  Assessment and Plan:  Pain of left lower extremity Left lower extremity not well controlled.  Patient reports worsening pain after using at home exercise cycling bicycle.  Reports pain 6 out of 10 on the pain scale of 0-10.  Advised patient to rest joint, apply ice, and use anti-inflammatory.  Naproxen 500 mg ordered twice daily as needed with food.  Follow-up with unresolved or worsening symptoms. Rx sent to pharmacy     Follow Up Instructions:  Follow-up with worsening unresolved symptoms.   I discussed the assessment and treatment plan with the patient. The patient was provided an opportunity to ask questions and all were answered. The patient agreed with the plan and demonstrated an understanding of the instructions.   The patient was advised to call back or seek an in-person evaluation if the symptoms worsen or if the condition fails to improve as anticipated.  The above assessment and management plan was discussed with the patient. The patient verbalized understanding of and has agreed to the management plan. Patient is aware to call the clinic if symptoms persist or worsen. Patient is aware when to return to the clinic for a follow-up visit. Patient educated on when it is appropriate to go to the emergency department.   Time call ended: 9:50 AM  I provided 10  minutes of non-face-to-face time during this encounter.    Daryll Drown, NP

## 2021-01-27 ENCOUNTER — Other Ambulatory Visit: Payer: Self-pay | Admitting: Family Medicine

## 2021-01-27 DIAGNOSIS — E782 Mixed hyperlipidemia: Secondary | ICD-10-CM

## 2021-01-28 ENCOUNTER — Encounter: Payer: Self-pay | Admitting: Family Medicine

## 2021-01-28 ENCOUNTER — Other Ambulatory Visit: Payer: Self-pay

## 2021-01-28 ENCOUNTER — Ambulatory Visit (INDEPENDENT_AMBULATORY_CARE_PROVIDER_SITE_OTHER): Payer: Medicare HMO | Admitting: Family Medicine

## 2021-01-28 VITALS — BP 126/70 | HR 62 | Temp 97.5°F | Ht 70.0 in | Wt 317.0 lb

## 2021-01-28 DIAGNOSIS — N611 Abscess of the breast and nipple: Secondary | ICD-10-CM | POA: Diagnosis not present

## 2021-01-28 MED ORDER — CEPHALEXIN 500 MG PO CAPS: 500.0000 mg | ORAL_CAPSULE | Freq: Two times a day (BID) | ORAL | 0 refills | Status: AC

## 2021-01-28 NOTE — Progress Notes (Signed)
Acute Office Visit  Subjective:    Patient ID: Kristine Mata, female    DOB: [birth year], 65 y.o.   MRN: [id number]  Chief Complaint  Patient presents with  . Breast Problem    HPI Patient is in today for a breast problem.  Nya reports a red spot on her left breast for 2-3 days. The area is not tender. Denies fever or drainage. Denies nipple retraction or discharge. She had a mammogram in August of 2021 that was negative. She does self breast exam frequently.   Past Medical History:  Diagnosis Date  . Ectopic pregnancy   . Heart murmur   . Hyperlipidemia   . Hypertension   . Insomnia, unspecified   . Right knee pain   . SVT (supraventricular tachycardia) (HCC)     Past Surgical History:  Procedure Laterality Date  . ABDOMINAL HYSTERECTOMY    . cyst left breast     . SALPINGECTOMY Left   . TMJ ARTHROPLASTY      Family History  Problem Relation Age of Onset  . Heart disease Mother        used nitroglycerin  . Hypertension Mother   . Thyroid disease Mother   . Pancreatic cancer Mother   . Hypertension Father   . Diabetes Sister   . Hypertension Sister   . Hypertension Brother   . Thyroid disease Brother   . Diabetes Brother   . Hypertension Sister   . Colon cancer Sister   . Hypertension Brother   . COPD Brother   . Heart disease Brother   . Stroke Brother   . COPD Brother   . Lung disease Brother   . Stroke Maternal Grandmother     Social History   Socioeconomic History  . Marital status: Legally Separated    Spouse name: Not on file  . Number of children: Not on file  . Years of education: Not on file  . Highest education level: Not on file  Occupational History  . Not on file  Tobacco Use  . Smoking status: Never Smoker  . Smokeless tobacco: Never Used  Vaping Use  . Vaping Use: Never used  Substance and Sexual Activity  . Alcohol use: Never  . Drug use: Never  . Sexual activity: Not Currently  Other Topics Concern  . Not on file   Social History Narrative  . Not on file   Social Determinants of Health   Financial Resource Strain: Not on file  Food Insecurity: Not on file  Transportation Needs: Not on file  Physical Activity: Not on file  Stress: Not on file  Social Connections: Not on file  Intimate Partner Violence: Not on file    Outpatient Medications Prior to Visit  Medication Sig Dispense Refill  . fluticasone (FLONASE) 50 MCG/ACT nasal spray Place 2 sprays into both nostrils daily. 16 g 6  . furosemide (LASIX) 40 MG tablet Take 1 tablet (40 mg total) by mouth daily. 90 tablet 1  . metoprolol (TOPROL-XL) 200 MG 24 hr tablet Take 1 tablet (200 mg total) by mouth daily. 90 tablet 1  . potassium chloride (KLOR-CON) 10 MEQ tablet Take 1 tablet (10 mEq total) by mouth daily. 90 tablet 1  . pravastatin (PRAVACHOL) 20 MG tablet Take 1 tablet (20 mg total) by mouth daily. 30 tablet 2  . amoxicillin (AMOXIL) 875 MG tablet Take 1 tablet (875 mg total) by mouth 2 (two) times daily. 14 tablet 0  . naproxen (NAPROSYN) 500  MG tablet Take 1 tablet (500 mg total) by mouth 2 (two) times daily with a meal. 30 tablet 0   No facility-administered medications prior to visit.    Allergies  Allergen Reactions  . Anesthesia S-I-40 [Propofol] Nausea And Vomiting  . Codeine Nausea And Vomiting  . Naproxen Palpitations    Review of Systems As per HPI.    Objective:    Physical Exam Vitals and nursing note reviewed.  Constitutional:      General: She is not in acute distress.    Appearance: She is not ill-appearing, toxic-appearing or diaphoretic.  Pulmonary:     Effort: Pulmonary effort is normal. No respiratory distress.  Chest:  Breasts:     Left: No swelling, bleeding, inverted nipple, nipple discharge or tenderness.     Musculoskeletal:     Right lower leg: No edema.     Left lower leg: No edema.  Skin:    General: Skin is warm and dry.  Neurological:     General: No focal deficit present.     Mental  Status: She is alert and oriented to person, place, and time.     BP 126/70   Pulse 62   Temp (!) 97.5 F (36.4 C) (Temporal)   Ht 5\' 10"  (1.778 m)   Wt (!) 317 lb (143.8 kg)   BMI 45.48 kg/m  Wt Readings from Last 3 Encounters:  01/28/21 (!) 317 lb (143.8 kg)  10/27/20 (!) 314 lb (142.4 kg)  07/15/20 (!) 307 lb 6.4 oz (139.4 kg)    Health Maintenance Due  Topic Date Due  . DEXA SCAN  Never done    There are no preventive care reminders to display for this patient.   No results found for: TSH Lab Results  Component Value Date   WBC 6.3 07/15/2020   HGB 12.7 07/15/2020   HCT 38.2 07/15/2020   MCV 88 07/15/2020   PLT 307 07/15/2020   Lab Results  Component Value Date   NA 143 07/15/2020   K 4.4 07/15/2020   CO2 27 07/15/2020   GLUCOSE 102 (H) 07/15/2020   BUN 10 07/15/2020   CREATININE 0.68 07/15/2020   BILITOT 0.3 07/15/2020   ALKPHOS 150 (H) 07/15/2020   AST 22 07/15/2020   ALT 12 07/15/2020   PROT 7.3 07/15/2020   ALBUMIN 4.6 07/15/2020   CALCIUM 10.6 (H) 07/15/2020   ANIONGAP 12 03/17/2018   Lab Results  Component Value Date   CHOL 203 (H) 07/15/2020   Lab Results  Component Value Date   HDL 72 07/15/2020   Lab Results  Component Value Date   LDLCALC 117 (H) 07/15/2020   Lab Results  Component Value Date   TRIG 79 07/15/2020   Lab Results  Component Value Date   CHOLHDL 2.8 07/15/2020   No results found for: HGBA1C     Assessment & Plan:   Kamylle was seen today for breast problem.  Diagnoses and all orders for this visit:  Breast abscess ? Abscess. Start keflex daily. Return to office for new or worsening symptoms, or if symptoms persist.  -     cephALEXin (KEFLEX) 500 MG capsule; Take 1 capsule (500 mg total) by mouth 2 (two) times daily for 10 days.  The patient indicates understanding of these issues and agrees with the plan.  Liborio Nixon, [name]

## 2021-01-28 NOTE — Patient Instructions (Signed)
Skin Abscess  A skin abscess is an infected area on or under your skin that contains a collection of pus and other material. An abscess may also be called a furuncle, carbuncle, or boil. An abscess can occur in or on almost any part of your body. Some abscesses break open (rupture) on their own. Most continue to get worse unless they are treated. The infection can spread deeper into the body and eventually into your blood, which can make you feel ill. Treatment usually involves draining the abscess. What are the causes? An abscess occurs when germs, like bacteria, pass through your skin and cause an infection. This may be caused by:  A scrape or cut on your skin.  A puncture wound through your skin, including a needle injection or insect bite.  Blocked oil or sweat glands.  Blocked and infected hair follicles.  A cyst that forms beneath your skin (sebaceous cyst) and becomes infected. What increases the risk? This condition is more likely to develop in people who:  Have a weak body defense system (immune system).  Have diabetes.  Have dry and irritated skin.  Get frequent injections or use illegal IV drugs.  Have a foreign body in a wound, such as a splinter.  Have problems with their lymph system or veins. What are the signs or symptoms? Symptoms of this condition include:  A painful, firm bump under the skin.  A bump with pus at the top. This may break through the skin and drain. Other symptoms include:  Redness surrounding the abscess site.  Warmth.  Swelling of the lymph nodes (glands) near the abscess.  Tenderness.  A sore on the skin. How is this diagnosed? This condition may be diagnosed based on:  A physical exam.  Your medical history.  A sample of pus. This may be used to find out what is causing the infection.  Blood tests.  Imaging tests, such as an ultrasound, CT scan, or MRI. How is this treated? A small abscess that drains on its own may not  need treatment. Treatment for larger abscesses may include:  Moist heat or heat pack applied to the area several times a day.  A procedure to drain the abscess (incision and drainage).  Antibiotic medicines. For a severe abscess, you may first get antibiotics through an IV and then change to antibiotics by mouth. Follow these instructions at home: Medicines  Take over-the-counter and prescription medicines only as told by your health care provider.  If you were prescribed an antibiotic medicine, take it as told by your health care provider. Do not stop taking the antibiotic even if you start to feel better.   Abscess care  If you have an abscess that has not drained, apply heat to the affected area. Use the heat source that your health care provider recommends, such as a moist heat pack or a heating pad. ? Place a towel between your skin and the heat source. ? Leave the heat on for 20-30 minutes. ? Remove the heat if your skin turns bright red. This is especially important if you are unable to feel pain, heat, or cold. You may have a greater risk of getting burned.  Follow instructions from your health care provider about how to take care of your abscess. Make sure you: ? Cover the abscess with a bandage (dressing). ? Change your dressing or gauze as told by your health care provider. ? Wash your hands with soap and water before you change the   dressing or gauze. If soap and water are not available, use hand sanitizer.  Check your abscess every day for signs of a worsening infection. Check for: ? More redness, swelling, or pain. ? More fluid or blood. ? Warmth. ? More pus or a bad smell.   General instructions  To avoid spreading the infection: ? Do not share personal care items, towels, or hot tubs with others. ? Avoid making skin contact with other people.  Keep all follow-up visits as told by your health care provider. This is important. Contact a health care provider if you  have:  More redness, swelling, or pain around your abscess.  More fluid or blood coming from your abscess.  Warm skin around your abscess.  More pus or a bad smell coming from your abscess.  A fever.  Muscle aches.  Chills or a general ill feeling. Get help right away if you:  Have severe pain.  See red streaks on your skin spreading away from the abscess. Summary  A skin abscess is an infected area on or under your skin that contains a collection of pus and other material.  A small abscess that drains on its own may not need treatment.  Treatment for larger abscesses may include having a procedure to drain the abscess and taking an antibiotic. This information is not intended to replace advice given to you by your health care provider. Make sure you discuss any questions you have with your health care provider. Document Revised: 02/22/2019 Document Reviewed: 12/15/2017 Elsevier Patient Education  2021 Elsevier Inc.  

## 2021-02-20 ENCOUNTER — Other Ambulatory Visit: Payer: Self-pay | Admitting: Family Medicine

## 2021-02-20 DIAGNOSIS — I471 Supraventricular tachycardia: Secondary | ICD-10-CM

## 2021-02-20 DIAGNOSIS — I10 Essential (primary) hypertension: Secondary | ICD-10-CM

## 2021-03-10 ENCOUNTER — Ambulatory Visit (INDEPENDENT_AMBULATORY_CARE_PROVIDER_SITE_OTHER): Payer: Medicare HMO

## 2021-03-10 ENCOUNTER — Ambulatory Visit (INDEPENDENT_AMBULATORY_CARE_PROVIDER_SITE_OTHER): Payer: Medicare HMO | Admitting: Family Medicine

## 2021-03-10 ENCOUNTER — Encounter: Payer: Self-pay | Admitting: Family Medicine

## 2021-03-10 ENCOUNTER — Telehealth: Payer: Self-pay

## 2021-03-10 ENCOUNTER — Other Ambulatory Visit: Payer: Self-pay

## 2021-03-10 VITALS — BP 125/73 | HR 63 | Temp 97.5°F | Ht 70.0 in | Wt 317.2 lb

## 2021-03-10 DIAGNOSIS — Z780 Asymptomatic menopausal state: Secondary | ICD-10-CM

## 2021-03-10 DIAGNOSIS — G4719 Other hypersomnia: Secondary | ICD-10-CM | POA: Diagnosis not present

## 2021-03-10 DIAGNOSIS — G8929 Other chronic pain: Secondary | ICD-10-CM | POA: Diagnosis not present

## 2021-03-10 DIAGNOSIS — M25561 Pain in right knee: Secondary | ICD-10-CM | POA: Insufficient documentation

## 2021-03-10 DIAGNOSIS — Z9189 Other specified personal risk factors, not elsewhere classified: Secondary | ICD-10-CM

## 2021-03-10 DIAGNOSIS — I471 Supraventricular tachycardia, unspecified: Secondary | ICD-10-CM

## 2021-03-10 DIAGNOSIS — Z0001 Encounter for general adult medical examination with abnormal findings: Secondary | ICD-10-CM

## 2021-03-10 DIAGNOSIS — I10 Essential (primary) hypertension: Secondary | ICD-10-CM | POA: Diagnosis not present

## 2021-03-10 DIAGNOSIS — Z1211 Encounter for screening for malignant neoplasm of colon: Secondary | ICD-10-CM

## 2021-03-10 DIAGNOSIS — Z0000 Encounter for general adult medical examination without abnormal findings: Secondary | ICD-10-CM | POA: Diagnosis not present

## 2021-03-10 DIAGNOSIS — E782 Mixed hyperlipidemia: Secondary | ICD-10-CM | POA: Diagnosis not present

## 2021-03-10 DIAGNOSIS — Z860100 Personal history of colon polyps, unspecified: Secondary | ICD-10-CM

## 2021-03-10 DIAGNOSIS — K219 Gastro-esophageal reflux disease without esophagitis: Secondary | ICD-10-CM

## 2021-03-10 DIAGNOSIS — R0683 Snoring: Secondary | ICD-10-CM

## 2021-03-10 DIAGNOSIS — Z800 Family history of malignant neoplasm of digestive organs: Secondary | ICD-10-CM

## 2021-03-10 MED ORDER — FAMOTIDINE 20 MG PO TABS: 20.0000 mg | ORAL_TABLET | Freq: Two times a day (BID) | ORAL | 2 refills | Status: DC | PRN

## 2021-03-10 MED ORDER — METOPROLOL SUCCINATE ER 200 MG PO TB24: 200.0000 mg | ORAL_TABLET | Freq: Every day | ORAL | 3 refills | Status: DC

## 2021-03-10 MED ORDER — POTASSIUM CHLORIDE CRYS ER 10 MEQ PO TBCR: 10.0000 meq | EXTENDED_RELEASE_TABLET | Freq: Every day | ORAL | 3 refills | Status: DC

## 2021-03-10 MED ORDER — FUROSEMIDE 40 MG PO TABS: 40.0000 mg | ORAL_TABLET | Freq: Every day | ORAL | 3 refills | Status: DC

## 2021-03-10 NOTE — Telephone Encounter (Signed)
Pt is going to join [employer]. No longer needs the weight loss referral

## 2021-03-10 NOTE — Progress Notes (Signed)
Assessment & Plan:  1. Well adult exam Preventive health education provided. Patient declined shingrix, COVID and pneumonia vaccines. - CBC with Differential/Platelet - CMP14+EGFR - Lipid panel - TSH  2. Essential hypertension Well controlled on current regimen.  - potassium chloride (KLOR-CON) 10 MEQ tablet; Take 1 tablet (10 mEq total) by mouth daily.  Dispense: 90 tablet; Refill: 3 - furosemide (LASIX) 40 MG tablet; Take 1 tablet (40 mg total) by mouth daily.  Dispense: 90 tablet; Refill: 3 - metoprolol (TOPROL-XL) 200 MG 24 hr tablet; Take 1 tablet (200 mg total) by mouth daily.  Dispense: 90 tablet; Refill: 3 - CBC with Differential/Platelet - CMP14+EGFR - Lipid panel  3. PSVT (paroxysmal supraventricular tachycardia) (HCC) Uncontrolled. Patient having palpitations at night. Referring to cardiology for possible event monitor.  - metoprolol (TOPROL-XL) 200 MG 24 hr tablet; Take 1 tablet (200 mg total) by mouth daily.  Dispense: 90 tablet; Refill: 3 - CMP14+EGFR - TSH - Ambulatory referral to Cardiology  4. Mixed hyperlipidemia Labs to assess. - CMP14+EGFR - Lipid panel  5. Morbid obesity ([place]) - CBC with Differential/Platelet - CMP14+EGFR - Lipid panel - TSH - Amb Ref to Medical Weight Management  6. Gastroesophageal reflux disease, unspecified whether esophagitis present Uncontrolled. Started famotidine.  - famotidine (PEPCID) 20 MG tablet; Take 1 tablet (20 mg total) by mouth 2 (two) times daily as needed for heartburn or indigestion.  Dispense: 60 tablet; Refill: 2  7. Snoring - Ambulatory referral to Pulmonology  8. Excessive daytime sleepiness - Ambulatory referral to Pulmonology  9. At risk for sleep apnea - Ambulatory referral to Pulmonology  10. Chronic pain of right knee - DG Knee 1-2 Views Right  11. Postmenopausal estrogen deficiency - DG [facility] DEXA  12. Colon cancer screening - Ambulatory referral to Gastroenterology  13. Family history of  colon cancer - Ambulatory referral to Gastroenterology  14. History of colon polyps - Ambulatory referral to Gastroenterology   Follow-up: Return in about 3 months (around 06/09/2021) for follow-up of chronic medication conditions.   Hendricks Limes, MSN, APRN, [name]-C [facility]  Subjective:  Patient ID: Kristine Mata, female    DOB: 1956-06-21  Age: 65 y.o. MRN: [id number]  Patient Care Team: Loman Brooklyn, [name] as PCP - General (Family Medicine) Herminio Commons, MD (Inactive) as PCP - Cardiology (Cardiology)   CC:  Chief Complaint  Patient presents with  . Annual Exam  . Knee Pain    Patient states she has been having right knee swelling and pain since 2018 but it is worse now.    . Palpitations    She has been ongoing heart palpitations that only happen at night.     HPI Kristine Mata presents for her annual physical.  Occupation: Retired, Marital status: Happily Divorced, Substance use: None Diet: Healthy once daily, Exercise: None due to her knee Last eye exam: needs to schedule Last dental exam: needs to schedule Last colonoscopy: 04/21/2015 Last mammogram: 07/09/2020 DEXA: >2 years ago per patient Hepatitis C Screening: Negative on 08/27/2015 Immunizations: Flu Vaccine: not flu season Tdap Vaccine: up to date  Shingrix Vaccine: declined  COVID-19 Vaccine: declined Pneumonia Vaccine: declined  DEPRESSION SCREENING PHQ 2/9 Scores 10/27/2020 07/15/2020 05/15/2020 03/13/2020  PHQ - 2 Score 0 0 0 1  PHQ- 9 Score - 2 - 5     Patient was previously referred to pulmonology due to risk factors for sleep apnea. She had to miss her appointment due to car  issues. She has a new car now and would like to be referred back.  Patient is requesting something for acid reflux.   Patient would like to lose weight but is unable to exercise due to her right knee. She reports when she gets on the stationary bike it swells up.   Patient has  palpitations. She has never worn a cardiac event monitor. They occur during the night. She was previously established with Dr. Jacinta Shoe.    Review of Systems  Constitutional: Negative for chills, fever, malaise/fatigue and weight loss.  HENT: Negative for congestion, ear discharge, ear pain, nosebleeds, sinus pain, sore throat and tinnitus.   Eyes: Negative for blurred vision, double vision, pain, discharge and redness.  Respiratory: Negative for cough, shortness of breath and wheezing.   Cardiovascular: Positive for palpitations. Negative for chest pain and leg swelling.  Gastrointestinal: Positive for heartburn. Negative for abdominal pain, constipation, diarrhea, nausea and vomiting.  Genitourinary: Negative for dysuria, frequency and urgency.  Musculoskeletal: Positive for joint pain. Negative for myalgias.  Skin: Negative for rash.  Neurological: Negative for dizziness, seizures, weakness and headaches.  Psychiatric/Behavioral: Negative for depression, substance abuse and suicidal ideas. The patient is not nervous/anxious.      Current Outpatient Medications:  .  fluticasone (FLONASE) 50 MCG/ACT nasal spray, Place 2 sprays into both nostrils daily., Disp: 16 g, Rfl: 6 .  furosemide (LASIX) 40 MG tablet, Take 1 tablet (40 mg total) by mouth daily. (NEEDS TO BE SEEN BEFORE NEXT REFILL), Disp: 30 tablet, Rfl: 0 .  metoprolol (TOPROL-XL) 200 MG 24 hr tablet, Take 1 tablet (200 mg total) by mouth daily. (NEEDS TO BE SEEN BEFORE NEXT REFILL), Disp: 30 tablet, Rfl: 0 .  potassium chloride (KLOR-CON) 10 MEQ tablet, Take 1 tablet (10 mEq total) by mouth daily., Disp: 90 tablet, Rfl: 1 .  pravastatin (PRAVACHOL) 20 MG tablet, Take 1 tablet by mouth once daily, Disp: 30 tablet, Rfl: 1  Allergies  Allergen Reactions  . Anesthesia S-I-40 [Propofol] Nausea And Vomiting  . Codeine Nausea And Vomiting  . Naproxen Palpitations    Past Medical History:  Diagnosis Date  . Ectopic pregnancy   .  Heart murmur   . Hyperlipidemia   . Hypertension   . Insomnia, unspecified   . Right knee pain   . SVT (supraventricular tachycardia) (HCC)     Past Surgical History:  Procedure Laterality Date  . ABDOMINAL HYSTERECTOMY    . cyst left breast     . SALPINGECTOMY Left   . TMJ ARTHROPLASTY      Family History  Problem Relation Age of Onset  . Heart disease Mother        used nitroglycerin  . Hypertension Mother   . Thyroid disease Mother   . Pancreatic cancer Mother   . Hypertension Father   . Diabetes Sister   . Hypertension Sister   . Hypertension Brother   . Thyroid disease Brother   . Diabetes Brother   . Hypertension Sister   . Colon cancer Sister   . Hypertension Brother   . COPD Brother   . Heart disease Brother   . Stroke Brother   . COPD Brother   . Lung disease Brother   . Stroke Maternal Grandmother     Social History   Socioeconomic History  . Marital status: Legally Separated    Spouse name: Not on file  . Number of children: Not on file  . Years of education: Not on file  .  Highest education level: Not on file  Occupational History  . Not on file  Tobacco Use  . Smoking status: Never Smoker  . Smokeless tobacco: Never Used  Vaping Use  . Vaping Use: Never used  Substance and Sexual Activity  . Alcohol use: Never  . Drug use: Never  . Sexual activity: Not Currently  Other Topics Concern  . Not on file  Social History Narrative  . Not on file   Social Determinants of Health   Financial Resource Strain: Not on file  Food Insecurity: Not on file  Transportation Needs: Not on file  Physical Activity: Not on file  Stress: Not on file  Social Connections: Not on file  Intimate Partner Violence: Not on file      Objective:    BP 125/73   Pulse 63   Temp (!) 97.5 F (36.4 C) (Temporal)   Ht '5\' 10"'  (1.778 m)   Wt (!) 317 lb 3.2 oz (143.9 kg)   SpO2 97%   BMI 45.51 kg/m   Wt Readings from Last 3 Encounters:  03/10/21 (!) 317 lb  3.2 oz (143.9 kg)  01/28/21 (!) 317 lb (143.8 kg)  10/27/20 (!) 314 lb (142.4 kg)    Physical Exam Vitals reviewed.  Constitutional:      General: She is not in acute distress.    Appearance: Normal appearance. She is morbidly obese. She is not ill-appearing, toxic-appearing or diaphoretic.  HENT:     Head: Normocephalic and atraumatic.     Right Ear: Tympanic membrane, ear canal and external ear normal. There is no impacted cerumen.     Left Ear: Tympanic membrane, ear canal and external ear normal. There is no impacted cerumen.     Nose: Nose normal. No congestion or rhinorrhea.     Mouth/Throat:     Mouth: Mucous membranes are moist.     Pharynx: Oropharynx is clear. No oropharyngeal exudate or posterior oropharyngeal erythema.  Eyes:     General: No scleral icterus.       Right eye: No discharge.        Left eye: No discharge.     Conjunctiva/sclera: Conjunctivae normal.     Pupils: Pupils are equal, round, and reactive to light.  Cardiovascular:     Rate and Rhythm: Normal rate and regular rhythm.     Heart sounds: Normal heart sounds. No murmur heard. No friction rub. No gallop.   Pulmonary:     Effort: Pulmonary effort is normal. No respiratory distress.     Breath sounds: Normal breath sounds. No stridor. No wheezing, rhonchi or rales.  Abdominal:     General: Abdomen is flat. Bowel sounds are normal. There is no distension.     Palpations: Abdomen is soft. There is no mass.     Tenderness: There is no abdominal tenderness. There is no guarding or rebound.     Hernia: No hernia is present.  Musculoskeletal:        General: Normal range of motion.     Cervical back: Normal range of motion and neck supple. No rigidity. No muscular tenderness.  Lymphadenopathy:     Cervical: No cervical adenopathy.  Skin:    General: Skin is warm and dry.     Capillary Refill: Capillary refill takes less than 2 seconds.  Neurological:     General: No focal deficit present.      Mental Status: She is alert and oriented to person, place, and time. Mental status is  at baseline.  Psychiatric:        Mood and Affect: Mood normal.        Behavior: Behavior normal.        Thought Content: Thought content normal.        Judgment: Judgment normal.     No results found for: TSH Lab Results  Component Value Date   WBC 6.3 07/15/2020   HGB 12.7 07/15/2020   HCT 38.2 07/15/2020   MCV 88 07/15/2020   PLT 307 07/15/2020   Lab Results  Component Value Date   NA 143 07/15/2020   K 4.4 07/15/2020   CO2 27 07/15/2020   GLUCOSE 102 (H) 07/15/2020   BUN 10 07/15/2020   CREATININE 0.68 07/15/2020   BILITOT 0.3 07/15/2020   ALKPHOS 150 (H) 07/15/2020   AST 22 07/15/2020   ALT 12 07/15/2020   PROT 7.3 07/15/2020   ALBUMIN 4.6 07/15/2020   CALCIUM 10.6 (H) 07/15/2020   ANIONGAP 12 03/17/2018   Lab Results  Component Value Date   CHOL 203 (H) 07/15/2020   Lab Results  Component Value Date   HDL 72 07/15/2020   Lab Results  Component Value Date   LDLCALC 117 (H) 07/15/2020   Lab Results  Component Value Date   TRIG 79 07/15/2020   Lab Results  Component Value Date   CHOLHDL 2.8 07/15/2020   No results found for: HGBA1C

## 2021-03-10 NOTE — Patient Instructions (Signed)
Preventive Care 66 Years and Older, Female Preventive care refers to lifestyle choices and visits with your health care provider that can promote health and wellness. This includes:  A yearly physical exam. This is also called an annual wellness visit.  Regular dental and eye exams.  Immunizations.  Screening for certain conditions.  Healthy lifestyle choices, such as: ? Eating a healthy diet. ? Getting regular exercise. ? Not using drugs or products that contain nicotine and tobacco. ? Limiting alcohol use. What can I expect for my preventive care visit? Physical exam Your health care provider will check your:  Height and weight. These may be used to calculate your BMI (body mass index). BMI is a measurement that tells if you are at a healthy weight.  Heart rate and blood pressure.  Body temperature.  Skin for abnormal spots. Counseling Your health care provider may ask you questions about your:  Past medical problems.  Family's medical history.  Alcohol, tobacco, and drug use.  Emotional well-being.  Home life and relationship well-being.  Sexual activity.  Diet, exercise, and sleep habits.  History of falls.  Memory and ability to understand (cognition).  Work and work Statistician.  Pregnancy and menstrual history.  Access to firearms. What immunizations do I need? Vaccines are usually given at various ages, according to a schedule. Your health care provider will recommend vaccines for you based on your age, medical history, and lifestyle or other factors, such as travel or where you work.   What tests do I need? Blood tests  Lipid and cholesterol levels. These may be checked every 5 years, or more often depending on your overall health.  Hepatitis C test.  Hepatitis B test. Screening  Lung cancer screening. You may have this screening every year starting at age 74 if you have a 30-pack-year history of smoking and currently smoke or have quit within  the past 15 years.  Colorectal cancer screening. ? All adults should have this screening starting at age 44 and continuing until age 58. ? Your health care provider may recommend screening at age 2 if you are at increased risk. ? You will have tests every 1-10 years, depending on your results and the type of screening test.  Diabetes screening. ? This is done by checking your blood sugar (glucose) after you have not eaten for a while (fasting). ? You may have this done every 1-3 years.  Mammogram. ? This may be done every 1-2 years. ? Talk with your health care provider about how often you should have regular mammograms.  Abdominal aortic aneurysm (AAA) screening. You may need this if you are a current or former smoker.  BRCA-related cancer screening. This may be done if you have a family history of breast, ovarian, tubal, or peritoneal cancers. Other tests  STD (sexually transmitted disease) testing, if you are at risk.  Bone density scan. This is done to screen for osteoporosis. You may have this done starting at age 104. Talk with your health care provider about your test results, treatment options, and if necessary, the need for more tests. Follow these instructions at home: Eating and drinking  Eat a diet that includes fresh fruits and vegetables, whole grains, lean protein, and low-fat dairy products. Limit your intake of foods with high amounts of sugar, saturated fats, and salt.  Take vitamin and mineral supplements as recommended by your health care provider.  Do not drink alcohol if your health care provider tells you not to drink.  If you drink alcohol: ? Limit how much you have to 0-1 drink a day. ? Be aware of how much alcohol is in your drink. In the [place] one drink equals one 12 oz bottle of beer (355 mL), one 5 oz glass of wine (148 mL), or one 1 oz glass of hard liquor (44 mL).   Lifestyle  Take daily care of your teeth and gums. Brush your teeth every morning  and night with fluoride toothpaste. Floss one time each day.  Stay active. Exercise for at least 30 minutes 5 or more days each week.  Do not use any products that contain nicotine or tobacco, such as cigarettes, e-cigarettes, and chewing tobacco. If you need help quitting, ask your health care provider.  Do not use drugs.  If you are sexually active, practice safe sex. Use a condom or other form of protection in order to prevent STIs (sexually transmitted infections).  Talk with your health care provider about taking a low-dose aspirin or statin.  Find healthy ways to cope with stress, such as: ? Meditation, yoga, or listening to music. ? Journaling. ? Talking to a trusted person. ? Spending time with friends and family. Safety  Always wear your seat belt while driving or riding in a vehicle.  Do not drive: ? If you have been drinking alcohol. Do not ride with someone who has been drinking. ? When you are tired or distracted. ? While texting.  Wear a helmet and other protective equipment during sports activities.  If you have firearms in your house, make sure you follow all gun safety procedures. What's next?  Visit your health care provider once a year for an annual wellness visit.  Ask your health care provider how often you should have your eyes and teeth checked.  Stay up to date on all vaccines. This information is not intended to replace advice given to you by your health care provider. Make sure you discuss any questions you have with your health care provider. Document Revised: 10/22/2020 Document Reviewed: 10/26/2018 Elsevier Patient Education  2021 Elsevier Inc.  

## 2021-03-11 ENCOUNTER — Other Ambulatory Visit: Payer: Self-pay | Admitting: Family Medicine

## 2021-03-11 DIAGNOSIS — E782 Mixed hyperlipidemia: Secondary | ICD-10-CM

## 2021-03-11 LAB — CBC WITH DIFFERENTIAL/PLATELET
Basophils Absolute: 0 10*3/uL (ref 0.0–0.2)
Basos: 0 %
EOS (ABSOLUTE): 0.2 10*3/uL (ref 0.0–0.4)
Eos: 3 %
Hematocrit: 39.5 % (ref 34.0–46.6)
Hemoglobin: 12.7 g/dL (ref 11.1–15.9)
Immature Grans (Abs): 0 10*3/uL (ref 0.0–0.1)
Immature Granulocytes: 0 %
Lymphocytes Absolute: 3.1 10*3/uL (ref 0.7–3.1)
Lymphs: 46 %
MCH: 28.9 pg (ref 26.6–33.0)
MCHC: 32.2 g/dL (ref 31.5–35.7)
MCV: 90 fL (ref 79–97)
Monocytes Absolute: 0.6 10*3/uL (ref 0.1–0.9)
Monocytes: 8 %
Neutrophils Absolute: 2.9 10*3/uL (ref 1.4–7.0)
Neutrophils: 43 %
Platelets: 293 10*3/uL (ref 150–450)
RBC: 4.4 x10E6/uL (ref 3.77–5.28)
RDW: 13.2 % (ref 11.7–15.4)
WBC: 6.9 10*3/uL (ref 3.4–10.8)

## 2021-03-11 LAB — CMP14+EGFR
ALT: 15 IU/L (ref 0–32)
AST: 21 IU/L (ref 0–40)
Albumin/Globulin Ratio: 1.9 (ref 1.2–2.2)
Albumin: 4.7 g/dL (ref 3.8–4.8)
Alkaline Phosphatase: 164 IU/L — ABNORMAL HIGH (ref 44–121)
BUN/Creatinine Ratio: 15 (ref 12–28)
BUN: 9 mg/dL (ref 8–27)
Bilirubin Total: 0.4 mg/dL (ref 0.0–1.2)
CO2: 27 mmol/L (ref 20–29)
Calcium: 10.5 mg/dL — ABNORMAL HIGH (ref 8.7–10.3)
Chloride: 102 mmol/L (ref 96–106)
Creatinine, Ser: 0.59 mg/dL (ref 0.57–1.00)
Globulin, Total: 2.5 g/dL (ref 1.5–4.5)
Glucose: 108 mg/dL — ABNORMAL HIGH (ref 65–99)
Potassium: 4.1 mmol/L (ref 3.5–5.2)
Sodium: 143 mmol/L (ref 134–144)
Total Protein: 7.2 g/dL (ref 6.0–8.5)
eGFR: 99 mL/min/{1.73_m2} (ref >59)

## 2021-03-11 LAB — LIPID PANEL
Chol/HDL Ratio: 2.6 ratio (ref 0.0–4.4)
Cholesterol, Total: 202 mg/dL — ABNORMAL HIGH (ref 100–199)
HDL: 78 mg/dL (ref >39)
LDL Chol Calc (NIH): 111 mg/dL — ABNORMAL HIGH (ref 0–99)
Triglycerides: 75 mg/dL (ref 0–149)
VLDL Cholesterol Cal: 13 mg/dL (ref 5–40)

## 2021-03-11 LAB — TSH: TSH: 0.627 u[IU]/mL (ref 0.450–4.500)

## 2021-03-11 MED ORDER — PRAVASTATIN SODIUM 40 MG PO TABS: 40.0000 mg | ORAL_TABLET | Freq: Every day | ORAL | 2 refills | Status: DC

## 2021-03-12 ENCOUNTER — Other Ambulatory Visit: Payer: Self-pay | Admitting: Family Medicine

## 2021-03-12 DIAGNOSIS — G8929 Other chronic pain: Secondary | ICD-10-CM

## 2021-03-12 DIAGNOSIS — M85852 Other specified disorders of bone density and structure, left thigh: Secondary | ICD-10-CM | POA: Diagnosis not present

## 2021-03-12 DIAGNOSIS — Z780 Asymptomatic menopausal state: Secondary | ICD-10-CM | POA: Diagnosis not present

## 2021-03-12 DIAGNOSIS — M8580 Other specified disorders of bone density and structure, unspecified site: Secondary | ICD-10-CM | POA: Insufficient documentation

## 2021-03-12 DIAGNOSIS — M1711 Unilateral primary osteoarthritis, right knee: Secondary | ICD-10-CM

## 2021-03-12 HISTORY — DX: Other specified disorders of bone density and structure, unspecified site: M85.80

## 2021-03-15 ENCOUNTER — Encounter: Payer: Self-pay | Admitting: Family Medicine

## 2021-03-16 ENCOUNTER — Encounter: Payer: Self-pay | Admitting: Internal Medicine

## 2021-03-20 ENCOUNTER — Encounter: Payer: Self-pay | Admitting: Family Medicine

## 2021-03-20 ENCOUNTER — Other Ambulatory Visit: Payer: Self-pay | Admitting: Family Medicine

## 2021-03-20 ENCOUNTER — Ambulatory Visit (INDEPENDENT_AMBULATORY_CARE_PROVIDER_SITE_OTHER): Payer: Medicare HMO

## 2021-03-20 ENCOUNTER — Ambulatory Visit: Payer: Medicare HMO | Admitting: Family Medicine

## 2021-03-20 VITALS — BP 140/82 | HR 74 | Ht 70.0 in | Wt 318.0 lb

## 2021-03-20 DIAGNOSIS — I10 Essential (primary) hypertension: Secondary | ICD-10-CM | POA: Diagnosis not present

## 2021-03-20 DIAGNOSIS — I471 Supraventricular tachycardia: Secondary | ICD-10-CM | POA: Diagnosis not present

## 2021-03-20 DIAGNOSIS — E782 Mixed hyperlipidemia: Secondary | ICD-10-CM | POA: Diagnosis not present

## 2021-03-20 NOTE — Progress Notes (Signed)
Cardiology Office Note  Date: 03/20/2021   ID: [name], [name] [birth year], MRN [id number]  PCP:  Gwenlyn Fudge, [name]  Cardiologist:  Prentice Docker, MD (Inactive) Electrophysiologist:  None   Chief Complaint: PSVT / tachcycardia  History of Present Illness: Kristine Mata is a 65 y.o. female with a history of PSVT, HTN, HLD, GERD, morbid obesity.    Last seen by Dr. Purvis Sheffield via telemedicine 09/26/2019.  She was experiencing palpitations about 4 times per week but only occurred at night while sleeping.  She was occasionally awakened by the symptoms.  She denied any exertional chest pain or dyspnea.  She stated her first episode of SVT occurred around 76.  She had been walking to try to lose weight.  She was continuing Toprol-XL 200 mg daily but continued to experience symptoms about 4 times per week.  A 2-week event monitor was ordered.  Blood pressure was borderline elevated.  There were no changes to therapy.  She is here today being referred by her primary care provider for palpitations.  She states she is noticing more frequent palpitations mostly in the evenings.  States the palpitations wake her up from sleep.  She denies any associated symptoms.  She does states she has some chest discomfort that feels like soreness and believes the soreness is related to the statin medication she is taking.  She states she was taking pravastatin 20 mg and PCP recently increased the dosage to 40 mg daily which she has not started.  She is on daily Toprol-XL 200 mg.  Her EKG today shows normal sinus rhythm rate of 67, voltage criteria for LVH.  Nonspecific ST and T wave abnormality.  She states she has gained some weight recently and has problems with her right knee.  She denies any anginal symptoms, shortness of breath, DOE, CVA or TIA-like symptoms, PND, orthopnea, bleeding, claudication, DVT or PE-like symptoms, or lower extremity edema.  Past Medical History:  Diagnosis Date  .  Ectopic pregnancy   . Heart murmur   . Hyperlipidemia   . Hypertension   . Insomnia, unspecified   . Osteopenia 03/12/2021  . Right knee pain   . SVT (supraventricular tachycardia) (HCC)     Past Surgical History:  Procedure Laterality Date  . ABDOMINAL HYSTERECTOMY    . cyst left breast     . SALPINGECTOMY Left   . TMJ ARTHROPLASTY      Current Outpatient Medications  Medication Sig Dispense Refill  . famotidine (PEPCID) 20 MG tablet Take 1 tablet (20 mg total) by mouth 2 (two) times daily as needed for heartburn or indigestion. 60 tablet 2  . fluticasone (FLONASE) 50 MCG/ACT nasal spray Place 2 sprays into both nostrils daily. 16 g 6  . furosemide (LASIX) 40 MG tablet Take 1 tablet (40 mg total) by mouth daily. 90 tablet 3  . metoprolol (TOPROL-XL) 200 MG 24 hr tablet Take 1 tablet (200 mg total) by mouth daily. 90 tablet 3  . potassium chloride (KLOR-CON) 10 MEQ tablet Take 1 tablet (10 mEq total) by mouth daily. 90 tablet 3  . pravastatin (PRAVACHOL) 40 MG tablet Take 1 tablet (40 mg total) by mouth daily. 30 tablet 2   No current facility-administered medications for this visit.   Allergies:  Anesthesia s-i-40 [propofol], Codeine, and Naproxen   Social History: The patient  reports that she has never smoked. She has never used smokeless tobacco. She reports that she does not drink alcohol and does  not use drugs.   Family History: The patient's family history includes COPD in her brother and brother; Colon cancer in her sister; Diabetes in her brother and sister; Heart disease in her brother and mother; Hypertension in her brother, brother, father, mother, sister, and sister; Lung disease in her brother; Pancreatic cancer in her mother; Stroke in her brother and maternal grandmother; Thyroid disease in her brother and mother.   ROS:  Please see the history of present illness. Otherwise, complete review of systems is positive for none.  All other systems are reviewed and  negative.   Physical Exam: VS:  BP 140/82   Pulse 74   Ht 5\' 10"  (1.778 m)   Wt (!) 318 lb (144.2 kg)   SpO2 98%   BMI 45.63 kg/m , BMI Body mass index is 45.63 kg/m.  Wt Readings from Last 3 Encounters:  03/20/21 (!) 318 lb (144.2 kg)  03/10/21 (!) 317 lb 3.2 oz (143.9 kg)  01/28/21 (!) 317 lb (143.8 kg)    General: Morbidly obese patient appears comfortable at rest. Neck: Supple, no elevated JVP or carotid bruits, no thyromegaly. Lungs: Clear to auscultation, nonlabored breathing at rest. Cardiac: Regular rate and rhythm, no S3 or significant systolic murmur, no pericardial rub. Extremities: No pitting edema, distal pulses 2+. Skin: Warm and dry. Musculoskeletal: No kyphosis. Neuropsychiatric: Alert and oriented x3, affect grossly appropriate.  ECG:  An ECG dated 03/20/2021 was personally reviewed today and demonstrated:  Normal sinus rhythm rate of 67, voltage criteria for LVH.  Nonspecific ST and T wave abnormality.  Recent Labwork: 03/10/2021: ALT 15; AST 21; BUN 9; Creatinine, Ser 0.59; Hemoglobin 12.7; Platelets 293; Potassium 4.1; Sodium 143; TSH 0.627     Component Value Date/Time   CHOL 202 (H) 03/10/2021 1029   TRIG 75 03/10/2021 1029   HDL 78 03/10/2021 1029   CHOLHDL 2.6 03/10/2021 1029   LDLCALC 111 (H) 03/10/2021 1029    Other Studies Reviewed Today:  Echocariogram 05/04/2018 Study Conclusions   - Left ventricle: The cavity size was normal. There was mild  concentric hypertrophy. Systolic function was normal. The  estimated ejection fraction was in the range of 60% to 65%. Wall  motion was normal; there were no regional wall motion  abnormalities.  - Aortic valve: Mildly calcified annulus. Trileaflet; mildly  thickened leaflets. Sclerosis without stenosis.  - Atrial septum: No defect or patent foramen ovale was identified.  Echo contrast study showed no right-to-left atrial level shunt.   Assessment and Plan:  1. PSVT (paroxysmal  supraventricular tachycardia) (HCC)   2. Essential hypertension   3. Mixed hyperlipidemia     1. PSVT (paroxysmal supraventricular tachycardia) (HCC) Recent complaints of increased episodes of palpitations.  Recently saw her primary care provider who referred her back to 05/06/2018 for increased palpitations.  He is currently on Toprol-XL 200 mg daily for palpitations.  States palpitations occur mostly at night and sometimes wake her up from sleep.  Please get a 14-day ZIO monitor.  2. Essential hypertension Blood pressure elevated today at 140/82.  She states her blood pressures at home are usually in the 120s over 30s systolic.  Continue Toprol-XL 200 mg daily.  Continue Lasix 40 mg daily.  Continue supplemental potassium.  3. Mixed hyperlipidemia Patient states she recently had lab work at PCPs office her pravastatin was dosage was increased to 40 mg.  She states she has not picked that dosage up from the pharmacy yet.  She is currently only taking 20  mg of pravastatin.  She states she is having some chest wall soreness worse when bending over and attributes it to a statin medication.  Advised her to stop pravastatin for 2 weeks to see if they chest wall pain resolved.  Recent lipid panel 03/10/2021 was total cholesterol 202, HDL 78, triglycerides 75, LDL 111.    Medication Adjustments/Labs and Tests Ordered: Current medicines are reviewed at length with the patient today.  Concerns regarding medicines are outlined above.   Disposition: Follow-up with Dr. Wyline Mood or APP 6 to 8 weeks  Signed, Rennis Harding, NP 03/20/2021 2:57 PM    [facility] at [facility] [address] [place], [place], [place] [address] Phone: [phone]; Fax: [phone]

## 2021-03-20 NOTE — Patient Instructions (Addendum)
Your physician recommends that you schedule a follow-up appointment in: 6 WEEKS WITH Nena Polio, NP  Your physician has recommended you make the following change in your medication:   HOLD PRAVASTATIN FOR 2 WEEKS AND CALL us WITH AN UPDATE ON YOUR SYMPTOMS  ZIO MONITOR FOR 14 DAYS   Thank you for choosing [facility]!!

## 2021-04-02 ENCOUNTER — Encounter: Payer: Self-pay | Admitting: Family

## 2021-04-02 ENCOUNTER — Ambulatory Visit (INDEPENDENT_AMBULATORY_CARE_PROVIDER_SITE_OTHER): Payer: Medicare HMO | Admitting: Family

## 2021-04-02 DIAGNOSIS — H65192 Other acute nonsuppurative otitis media, left ear: Secondary | ICD-10-CM

## 2021-04-02 MED ORDER — AMOXICILLIN-POT CLAVULANATE 875-125 MG PO TABS: 1.0000 | ORAL_TABLET | Freq: Two times a day (BID) | ORAL | 0 refills | Status: DC

## 2021-04-02 NOTE — Progress Notes (Signed)
   Virtual Visit  Note Due to COVID-19 pandemic this visit was conducted virtually. This visit type was conducted due to national recommendations for restrictions regarding the COVID-19 Pandemic (e.g. social distancing, sheltering in place) in an effort to limit this patient's exposure and mitigate transmission in our community. All issues noted in this document were discussed and addressed.  A physical exam was not performed with this format.  I connected with Kristine Mata on 04/02/21 at 12:48 pm by telephone and verified that I am speaking with the correct person using two identifiers. Kristine Mata is currently located at home and  No one is currently with her during visit. The provider, Jannifer Rodney, [name] is located in their office at time of visit.  I discussed the limitations, risks, security and privacy concerns of performing an evaluation and management service by telephone and the availability of in person appointments. I also discussed with the patient that there may be a patient responsible charge related to this service. The patient expressed understanding and agreed to proceed.   History and Present Illness:  Otalgia  There is pain in the left ear. This is a new problem. The problem occurs constantly. The problem has been gradually worsening. There has been no fever. The pain is at a severity of 8/10. Associated symptoms include headaches, hearing loss and neck pain. Pertinent negatives include no coughing, diarrhea, ear discharge, rhinorrhea or sore throat. She has tried acetaminophen for the symptoms. The treatment provided mild relief.      Review of Systems  HENT: Positive for ear pain and hearing loss. Negative for ear discharge, rhinorrhea and sore throat.   Respiratory: Negative for cough.   Gastrointestinal: Negative for diarrhea.  Musculoskeletal: Positive for neck pain.  Neurological: Positive for headaches.     Observations/Objective: No SOB or distress  noted   Assessment and Plan: 1. Other non-recurrent acute nonsuppurative otitis media of left ear - Take meds as prescribed - Use a cool mist humidifier  -Use saline nose sprays frequently -Force fluids -For any cough or congestion  Use plain Mucinex- regular strength or max strength is fine -For fever or aces or pains- take tylenol or ibuprofen. -Throat lozenges if help RTO if symptoms worsen or do not improve  - amoxicillin-clavulanate (AUGMENTIN) 875-125 MG tablet; Take 1 tablet by mouth 2 (two) times daily.  Dispense: 14 tablet; Refill: 0     I discussed the assessment and treatment plan with the patient. The patient was provided an opportunity to ask questions and all were answered. The patient agreed with the plan and demonstrated an understanding of the instructions.   The patient was advised to call back or seek an in-person evaluation if the symptoms worsen or if the condition fails to improve as anticipated.  The above assessment and management plan was discussed with the patient. The patient verbalized understanding of and has agreed to the management plan. Patient is aware to call the clinic if symptoms persist or worsen. Patient is aware when to return to the clinic for a follow-up visit. Patient educated on when it is appropriate to go to the emergency department.   Time call ended:  12:59 pm   I provided 11 minutes of  non face-to-face time during this encounter.    Jannifer Rodney, [name]

## 2021-04-03 DIAGNOSIS — I471 Supraventricular tachycardia: Secondary | ICD-10-CM | POA: Diagnosis not present

## 2021-04-08 ENCOUNTER — Ambulatory Visit: Payer: Medicare HMO | Admitting: Orthopaedic Surgery

## 2021-04-14 DIAGNOSIS — I471 Supraventricular tachycardia: Secondary | ICD-10-CM | POA: Diagnosis not present

## 2021-04-22 ENCOUNTER — Ambulatory Visit: Payer: Medicare HMO | Admitting: Orthopaedic Surgery

## 2021-04-22 ENCOUNTER — Other Ambulatory Visit: Payer: Self-pay

## 2021-04-23 ENCOUNTER — Telehealth: Payer: Self-pay | Admitting: Family Medicine

## 2021-04-23 NOTE — Telephone Encounter (Signed)
Lesle Chris, LPN  05/23/8920  1:94 PM EDT Back to Top     Notified, copy to pcp.    Lesle Chris, LPN  11/22/4079  4:48 PM EDT      Number not available - 2nd attempt.    Lesle Chris, LPN  11/22/5629  4:97 PM EDT      Not a working number.    Netta Neat., NP  04/16/2021  8:12 AM EDT      Please call the patient and let her know the cardiac monitor showed her heart rate ranged from a low of 43 most likely when she was sleeping, up to 130 bpm with an average heart rate of 69.  She had some rare premature beats.  She had one 5 beat episode of nonsustained V. tach and 2 brief runs of SVT.  1 episode of second-degree heart block with 2.8-second pauses but was not clearly symptomatic with no evidence of the patient triggering the monitor at thattime.  No significant or sustained arrhythmias.

## 2021-04-23 NOTE — Telephone Encounter (Signed)
Patient called requesting results  of recent heart monitor. [phone].

## 2021-05-01 ENCOUNTER — Ambulatory Visit: Payer: Medicare HMO | Admitting: Family Medicine

## 2021-05-27 ENCOUNTER — Ambulatory Visit (INDEPENDENT_AMBULATORY_CARE_PROVIDER_SITE_OTHER): Payer: Medicare HMO | Admitting: Nurse Practitioner

## 2021-05-27 ENCOUNTER — Encounter: Payer: Self-pay | Admitting: Nurse Practitioner

## 2021-05-27 DIAGNOSIS — R21 Rash and other nonspecific skin eruption: Secondary | ICD-10-CM | POA: Diagnosis not present

## 2021-05-27 MED ORDER — PREDNISONE 10 MG (21) PO TBPK: ORAL_TABLET | ORAL | 0 refills | Status: DC

## 2021-05-27 NOTE — Patient Instructions (Signed)
Rash, Adult  A rash is a change in the color of your skin. A rash can also change the way your skin feels. There are many different conditions and factors that can causea rash. Follow these instructions at home: The goal of treatment is to stop the itching and keep the rash from spreading. Watch for any changes in your symptoms. Let your doctor know about them. Followthese instructions to help with your condition: Medicine Take or apply over-the-counter and prescription medicines only as told by your doctor. These may include medicines: To treat red or swollen skin (corticosteroid creams). To treat itching. To treat an allergy (oral antihistamines). To treat very bad symptoms (oral corticosteroids).  Skin care Put cool cloths (compresses) on the affected areas. Do not scratch or rub your skin. Avoid covering the rash. Make sure that the rash is exposed to air as much as possible. Managing itching and discomfort Avoid hot showers or baths. These can make itching worse. A cold shower may help. Try taking a bath with: Epsom salts. You can get these at your local pharmacy or grocery store. Follow the instructions on the package. Baking soda. Pour a small amount into the bath as told by your doctor. Colloidal oatmeal. You can get this at your local pharmacy or grocery store. Follow the instructions on the package. Try putting baking soda paste onto your skin. Stir water into baking soda until it gets like a paste. Try putting on a lotion that relieves itchiness (calamine lotion). Keep cool and out of the sun. Sweating and being hot can make itching worse. General instructions  Rest as needed. Drink enough fluid to keep your pee (urine) pale yellow. Wear loose-fitting clothing. Avoid scented soaps, detergents, and perfumes. Use gentle soaps, detergents, perfumes, and other cosmetic products. Avoid anything that causes your rash. Keep a journal to help track what causes your rash. Write  down: What you eat. What cosmetic products you use. What you drink. What you wear. This includes jewelry. Keep all follow-up visits as told by your doctor. This is important.  Contact a doctor if: You sweat at night. You lose weight. You pee (urinate) more than normal. You pee less than normal, or you notice that your pee is a darker color than normal. You feel weak. You throw up (vomit). Your skin or the whites of your eyes look yellow (jaundice). Your skin: Tingles. Is numb. Your rash: Does not go away after a few days. Gets worse. You are: More thirsty than normal. More tired than normal. You have: New symptoms. Pain in your belly (abdomen). A fever. Watery poop (diarrhea). Get help right away if: You have a fever and your symptoms suddenly get worse. You start to feel mixed up (confused). You have a very bad headache or a stiff neck. You have very bad joint pains or stiffness. You have jerky movements that you cannot control (seizure). Your rash covers all or most of your body. The rash may or may not be painful. You have blisters that: Are on top of the rash. Grow larger. Grow together. Are painful. Are inside your nose or mouth. You have a rash that: Looks like purple pinprick-sized spots all over your body. Has a "bull's eye" or looks like a target. Is red and painful, causes your skin to peel, and is not from being in the sun too long. Summary A rash is a change in the color of your skin. A rash can also change the way your skin feels.   The goal of treatment is to stop the itching and keep the rash from spreading. Take or apply over-the-counter and prescription medicines only as told by your doctor. Contact a doctor if you have new symptoms or symptoms that get worse. Keep all follow-up visits as told by your doctor. This is important. This information is not intended to replace advice given to you by your health care provider. Make sure you discuss any  questions you have with your healthcare provider. Document Revised: 02/23/2019 Document Reviewed: 06/05/2018 Elsevier Patient Education  2022 Elsevier Inc.  

## 2021-05-27 NOTE — Assessment & Plan Note (Signed)
Unresolved rash under left arm, prednisone taper, avoid blades on skin/ harsh skin product., follow-up office visit with lump on left breast for assessment before referral.  Patient verbalized understanding. Rx sent to pharmacy

## 2021-05-27 NOTE — Progress Notes (Signed)
   Virtual Visit  Note Due to COVID-19 pandemic this visit was conducted virtually. This visit type was conducted due to national recommendations for restrictions regarding the COVID-19 Pandemic (e.g. social distancing, sheltering in place) in an effort to limit this patient's exposure and mitigate transmission in our community. All issues noted in this document were discussed and addressed.  A physical exam was not performed with this format.  I connected with Kristine Mata on 05/27/21 at 9:42 AM home by telephone and verified that I am speaking with the correct person using two identifiers. Kristine Mata is currently located at home during visit. The provider, Daryll Drown, NP is located in their office at time of visit.  I discussed the limitations, risks, security and privacy concerns of performing an evaluation and management service by telephone and the availability of in person appointments. I also discussed with the patient that there may be a patient responsible charge related to this service. The patient expressed understanding and agreed to proceed.   History and Present Illness:  Rash This is a recurrent problem. The current episode started in the past 7 days. The problem is unchanged. The affected locations include the left hand. The rash is characterized by dryness (dark). She was exposed to nothing. Pertinent negatives include no congestion, cough, fatigue or sore throat. Past treatments include nothing.     Review of Systems  Constitutional:  Negative for fatigue.  HENT:  Negative for congestion and sore throat.   Respiratory:  Negative for cough.   Skin:  Positive for rash.  All other systems reviewed and are negative.   Observations/Objective: Tele visit , patient did not sound to be in distress.  Assessment and Plan: Unresolved rash under left arm, prednisone taper, avoid blades on skin/ harsh skin product., follow-up office visit with lump on left breast for  assessment before referral.  Patient verbalized understanding.  Follow Up Instructions:   Follow up with worsening or unresolved symptoms    I discussed the assessment and treatment plan with the patient. The patient was provided an opportunity to ask questions and all were answered. The patient agreed with the plan and demonstrated an understanding of the instructions.   The patient was advised to call back or seek an in-person evaluation if the symptoms worsen or if the condition fails to improve as anticipated.  The above assessment and management plan was discussed with the patient. The patient verbalized understanding of and has agreed to the management plan. Patient is aware to call the clinic if symptoms persist or worsen. Patient is aware when to return to the clinic for a follow-up visit. Patient educated on when it is appropriate to go to the emergency department.   Time call ended: 9:52 AM  I provided 10 minutes of  non face-to-face time during this encounter.    Daryll Drown, NP

## 2021-05-29 ENCOUNTER — Ambulatory Visit: Payer: Medicare HMO | Admitting: Family Medicine

## 2021-06-05 ENCOUNTER — Other Ambulatory Visit: Payer: Self-pay | Admitting: Family Medicine

## 2021-06-05 DIAGNOSIS — Z1231 Encounter for screening mammogram for malignant neoplasm of breast: Secondary | ICD-10-CM

## 2021-06-08 ENCOUNTER — Ambulatory Visit (INDEPENDENT_AMBULATORY_CARE_PROVIDER_SITE_OTHER): Payer: Medicare HMO | Admitting: Nurse Practitioner

## 2021-06-08 ENCOUNTER — Encounter: Payer: Self-pay | Admitting: Nurse Practitioner

## 2021-06-08 VITALS — Temp 99.0°F

## 2021-06-08 DIAGNOSIS — J028 Acute pharyngitis due to other specified organisms: Secondary | ICD-10-CM | POA: Diagnosis not present

## 2021-06-08 DIAGNOSIS — J069 Acute upper respiratory infection, unspecified: Secondary | ICD-10-CM | POA: Insufficient documentation

## 2021-06-08 MED ORDER — AZITHROMYCIN 250 MG PO TABS
ORAL_TABLET | ORAL | 0 refills | Status: AC
Start: 2021-06-08 — End: 2021-06-13

## 2021-06-08 NOTE — Assessment & Plan Note (Signed)
Symptoms of congestion, headache, low-grade fever of 99.0, sore throat, slightly swollen lymph node, cough and tooth ache in the past 3 to 4 days.  Encourage patient to come to clinic for COVID PCR test results pending, increase hydration, azithromycin 250 mg tablet by mouth.  Education provided to patient, Rx sent to pharmacy.

## 2021-06-08 NOTE — Progress Notes (Signed)
   Virtual Visit  Note Due to COVID-19 pandemic this visit was conducted virtually. This visit type was conducted due to national recommendations for restrictions regarding the COVID-19 Pandemic (e.g. social distancing, sheltering in place) in an effort to limit this patient's exposure and mitigate transmission in our community. All issues noted in this document were discussed and addressed.  A physical exam was not performed with this format.  I connected with Kristine Mata on 06/08/21 at 8: 47 am by telephone and verified that I am speaking with the correct person using two identifiers. Kristine Mata is currently located at home during visit. The provider, Daryll Drown, NP is located in their office at time of visit.  I discussed the limitations, risks, security and privacy concerns of performing an evaluation and management service by telephone and the availability of in person appointments. I also discussed with the patient that there may be a patient responsible charge related to this service. The patient expressed understanding and agreed to proceed.   History and Present Illness:  Sore Throat  This is a new problem. The current episode started yesterday. The problem has been gradually worsening. Neither side of throat is experiencing more pain than the other. There has been no fever. The pain is moderate. Associated symptoms include congestion, coughing, headaches and swollen glands. She has had no exposure to strep or mono. She has tried nothing for the symptoms.     Review of Systems  HENT:  Positive for congestion and sore throat.   Respiratory:  Positive for cough.   Neurological:  Positive for headaches.  All other systems reviewed and are negative.   Observations/Objective: Televisit patient not in distress.  Assessment and Plan: Symptoms of congestion, headache, low-grade fever of 99.0, sore throat, slightly swollen lymph node, cough and tooth ache in the past 3 to  4 days.  Encourage patient to come to clinic for COVID PCR test results pending, increase hydration, azithromycin 250 mg tablet by mouth.  Education provided to patient, Rx sent to pharmacy.  Follow Up Instructions: Follow-up with worsening or unresolved symptoms.    I discussed the assessment and treatment plan with the patient. The patient was provided an opportunity to ask questions and all were answered. The patient agreed with the plan and demonstrated an understanding of the instructions.   The patient was advised to call back or seek an in-person evaluation if the symptoms worsen or if the condition fails to improve as anticipated.  The above assessment and management plan was discussed with the patient. The patient verbalized understanding of and has agreed to the management plan. Patient is aware to call the clinic if symptoms persist or worsen. Patient is aware when to return to the clinic for a follow-up visit. Patient educated on when it is appropriate to go to the emergency department.   Time call ended: 8:58 AM  I provided 11 minutes of  non face-to-face time during this encounter.    Daryll Drown, NP

## 2021-06-09 ENCOUNTER — Other Ambulatory Visit: Payer: Self-pay | Admitting: Nurse Practitioner

## 2021-06-09 ENCOUNTER — Telehealth: Payer: Self-pay | Admitting: Family Medicine

## 2021-06-09 DIAGNOSIS — U071 COVID-19: Secondary | ICD-10-CM

## 2021-06-09 LAB — SARS-COV-2, NAA 2 DAY TAT

## 2021-06-09 LAB — NOVEL CORONAVIRUS, NAA: SARS-CoV-2, NAA: DETECTED — AB

## 2021-06-09 MED ORDER — MOLNUPIRAVIR EUA 200MG CAPSULE: 4.0000 | ORAL_CAPSULE | Freq: Two times a day (BID) | ORAL | 0 refills | Status: AC

## 2021-06-09 NOTE — Telephone Encounter (Signed)
Patient had visit yesterday, was positive for Covid.  Would like anti-viral medication.

## 2021-06-09 NOTE — Telephone Encounter (Signed)
  Prescription Request  06/09/2021  What is the name of the medication or equipment? Pt tested positive for Covid and wants the antiviral medication called in. Pt had appointment yesterday  Have you contacted your pharmacy to request a refill? (if applicable) no  Which pharmacy would you like this sent to? [facility]   Patient notified that their request is being sent to the clinical staff for review and that they should receive a response within 2 business days.

## 2021-06-11 ENCOUNTER — Ambulatory Visit (INDEPENDENT_AMBULATORY_CARE_PROVIDER_SITE_OTHER): Payer: Medicare HMO

## 2021-06-11 VITALS — Ht 70.0 in | Wt 318.0 lb

## 2021-06-11 DIAGNOSIS — Z0000 Encounter for general adult medical examination without abnormal findings: Secondary | ICD-10-CM | POA: Diagnosis not present

## 2021-06-11 NOTE — Progress Notes (Signed)
Subjective:   Kristine Mata is a 65 y.o. female who presents for an Initial Medicare Annual Wellness Visit.  Virtual Visit via Telephone Note  I connected with  Kristine Mata on 06/11/21 at 11:15 AM EDT by telephone and verified that I am speaking with the correct person using two identifiers.  Location: Patient: Home Provider: [facility] Persons participating in the virtual visit: patient/Nurse Health Advisor   I discussed the limitations, risks, security and privacy concerns of performing an evaluation and management service by telephone and the availability of in person appointments. The patient expressed understanding and agreed to proceed.  Interactive audio and video telecommunications were attempted between this nurse and patient, however failed, due to patient having technical difficulties OR patient did not have access to video capability.  We continued and completed visit with audio only.  Some vital signs may be absent or patient reported.   Giovannie Scerbo E Dynisha Due, LPN   Review of Systems     Cardiac Risk Factors include: advanced age (>1555men, 11>65 women);obesity (BMI >30kg/m2);sedentary lifestyle;dyslipidemia;hypertension     Objective:    Today's Vitals   06/11/21 1104  Weight: (!) 318 lb (144.2 kg)  Height: 5\' 10"  (1.778 m)   Body mass index is 45.63 kg/m.  Advanced Directives 06/11/2021 03/17/2018  Does Patient Have a Medical Advance Directive? Yes No  Type of Advance Directive Healthcare Power of Attorney -  Copy of Healthcare Power of Attorney in Chart? No - copy requested -    Current Medications (verified) Outpatient Encounter Medications as of 06/11/2021  Medication Sig   amoxicillin-clavulanate (AUGMENTIN) 875-125 MG tablet Take 1 tablet by mouth 2 (two) times daily.   azithromycin (ZITHROMAX) 250 MG tablet Take 2 tablets on day 1, then 1 tablet daily on days 2 through 5   famotidine (PEPCID) 20 MG tablet Take 1 tablet (20 mg total) by mouth 2 (two) times  daily as needed for heartburn or indigestion.   fluticasone (FLONASE) 50 MCG/ACT nasal spray Place 2 sprays into both nostrils daily.   furosemide (LASIX) 40 MG tablet Take 1 tablet (40 mg total) by mouth daily.   metoprolol (TOPROL-XL) 200 MG 24 hr tablet Take 1 tablet (200 mg total) by mouth daily.   potassium chloride (KLOR-CON) 10 MEQ tablet Take 1 tablet (10 mEq total) by mouth daily.   pravastatin (PRAVACHOL) 40 MG tablet Take 1 tablet (40 mg total) by mouth daily.   predniSONE (STERAPRED UNI-PAK 21 TAB) 10 MG (21) TBPK tablet 6 tablet day 1, 5 tablet day 2, 4 tablet day 3, 3 tablet day 4, 2 tablet day 5, 1 tablet day 6   molnupiravir EUA 200 mg CAPS Take 4 capsules (800 mg total) by mouth 2 (two) times daily for 5 days. (Patient not taking: Reported on 06/11/2021)   No facility-administered encounter medications on file as of 06/11/2021.    Allergies (verified) Anesthesia s-i-40 [propofol], Codeine, and Naproxen   History: Past Medical History:  Diagnosis Date   Ectopic pregnancy    Heart murmur    Hyperlipidemia    Hypertension    Insomnia, unspecified    Osteopenia 03/12/2021   Right knee pain    SVT (supraventricular tachycardia) (HCC)    Past Surgical History:  Procedure Laterality Date   ABDOMINAL HYSTERECTOMY     cyst left breast      SALPINGECTOMY Left    TMJ ARTHROPLASTY     Family History  Problem Relation Age of Onset   Heart disease  Mother        used nitroglycerin   Hypertension Mother    Thyroid disease Mother    Pancreatic cancer Mother    Hypertension Father    Diabetes Sister    Hypertension Sister    Hypertension Brother    Thyroid disease Brother    Diabetes Brother    Hypertension Sister    Colon cancer Sister    Hypertension Brother    COPD Brother    Heart disease Brother    Stroke Brother    COPD Brother    Lung disease Brother    Stroke Maternal Grandmother    Social History   Socioeconomic History   Marital status: Divorced     Spouse name: Not on file   Number of children: 1   Years of education: Not on file   Highest education level: Not on file  Occupational History   Occupation: Retired  Tobacco Use   Smoking status: Never   Smokeless tobacco: Never  Vaping Use   Vaping Use: Never used  Substance and Sexual Activity   Alcohol use: Never   Drug use: Never   Sexual activity: Not Currently  Other Topics Concern   Not on file  Social History Narrative   Lives alone. Has one son - lives in [place].   Social Determinants of Health   Financial Resource Strain: Low Risk    Difficulty of Paying Living Expenses: Not very hard  Food Insecurity: No Food Insecurity   Worried About Programme researcher, broadcasting/film/video in the Last Year: Never true   Barista in the Last Year: Never true  Transportation Needs: No Transportation Needs   Lack of Transportation (Medical): No   Lack of Transportation (Non-Medical): No  Physical Activity: Insufficiently Active   Days of Exercise per Week: 5 days   Minutes of Exercise per Session: 20 min  Stress: Stress Concern Present   Feeling of Stress : To some extent  Social Connections: Moderately Isolated   Frequency of Communication with Friends and Family: More than three times a week   Frequency of Social Gatherings with Friends and Family: Twice a week   Attends Religious Services: More than 4 times per year   Active Member of [employer] or Organizations: No   Attends Engineer, structural: Never   Marital Status: Divorced    Tobacco Counseling Counseling given: Not Answered   Clinical Intake:  Pre-visit preparation completed: Yes  Pain : No/denies pain     BMI - recorded: 45.63 Nutritional Status: BMI > 30  Obese Nutritional Risks: None Diabetes: No  How often do you need to have someone help you when you read instructions, pamphlets, or other written materials from your doctor or pharmacy?: 1 - Never  Diabetic? No  Interpreter Needed?: No  Information  entered by :: Cadence Minton, LPN   Activities of Daily Living In your present state of health, do you have any difficulty performing the following activities: 06/11/2021  Hearing? N  Vision? N  Difficulty concentrating or making decisions? N  Walking or climbing stairs? Y  Dressing or bathing? N  Doing errands, shopping? N  Preparing Food and eating ? N  Using the Toilet? N  In the past six months, have you accidently leaked urine? Y  Comment wears pads prn  Do you have problems with loss of bowel control? N  Managing your Medications? N  Managing your Finances? N  Housekeeping or managing your Housekeeping? N  Some recent data might be hidden    Patient Care Team: Gwenlyn Fudge, [name] as PCP - General (Family Medicine) Laqueta Linden, MD (Inactive) as PCP - Cardiology (Cardiology) Jena Gauss Gerrit Friends, MD as Consulting Physician (Gastroenterology)  Indicate any recent Medical Services you may have received from other than [facility] providers in the past year (date may be approximate).     Assessment:   This is a routine wellness examination for Brianni.  Hearing/Vision screen Hearing Screening - Comments:: Denies hearing difficulties  Vision Screening - Comments:: Wears reading glasses only - behind on eye exams - has been years - she doesn't remember optometrist's name  Dietary issues and exercise activities discussed: Current Exercise Habits: Home exercise routine, Type of exercise: walking;Other - see comments (exercise bike), Time (Minutes): 20, Frequency (Times/Week): 5, Weekly Exercise (Minutes/Week): 100, Intensity: Mild, Exercise limited by: orthopedic condition(s)   Goals Addressed             This Visit's Progress    Exercise 3x per week (30 min per time)         Depression Screen PHQ 2/9 Scores 06/11/2021 10/27/2020 07/15/2020 05/15/2020 03/13/2020  PHQ - 2 Score 1 0 0 0 1  PHQ- 9 Score - - 2 - 5    Fall Risk Fall Risk  06/11/2021 10/27/2020 07/15/2020 05/15/2020  03/13/2020  Falls in the past year? 1 1 0 0 0  Comment she was walking in the yard and her left knee gave out - - - -  Number falls in past yr: 0 0 - - -  Injury with Fall? 0 0 - - -  Risk for fall due to : History of fall(s);Orthopedic patient No Fall Risks - - -  Follow up Falls prevention discussed Falls evaluation completed - - -    FALL RISK PREVENTION PERTAINING TO THE HOME:  Any stairs in or around the home? Yes  If so, are there any without handrails? No  Home free of loose throw rugs in walkways, pet beds, electrical cords, etc? Yes  Adequate lighting in your home to reduce risk of falls? Yes   ASSISTIVE DEVICES UTILIZED TO PREVENT FALLS:  Life alert? No  Use of a cane, walker or w/c? Yes  Grab bars in the bathroom? Yes  Shower chair or bench in shower? No  Elevated toilet seat or a handicapped toilet? No   TIMED UP AND GO:  Was the test performed? No . Telephonic visit  Cognitive Function:     6CIT Screen 06/11/2021  What Year? 0 points  What month? 0 points  What time? 0 points  Count back from 20 0 points  Months in reverse 0 points  Repeat phrase 0 points  Total Score 0    Immunizations Immunization History  Administered Date(s) Administered   Influenza,inj,Quad PF,6+ Mos 09/06/2017   Influenza-Unspecified 10/14/2015, 08/06/2016, 08/15/2018   Tdap 11/15/2016    TDAP status: Up to date  Flu Vaccine status: Declined, Education has been provided regarding the importance of this vaccine but patient still declined. Advised may receive this vaccine at local pharmacy or Health Dept. Aware to provide a copy of the vaccination record if obtained from local pharmacy or Health Dept. Verbalized acceptance and understanding.  Pneumococcal vaccine status: Declined,  Education has been provided regarding the importance of this vaccine but patient still declined. Advised may receive this vaccine at local pharmacy or Health Dept. Aware to provide a copy of the  vaccination record if obtained  from local pharmacy or Health Dept. Verbalized acceptance and understanding.   Covid-19 vaccine status: Declined, Education has been provided regarding the importance of this vaccine but patient still declined. Advised may receive this vaccine at local pharmacy or Health Dept.or vaccine clinic. Aware to provide a copy of the vaccination record if obtained from local pharmacy or Health Dept. Verbalized acceptance and understanding.  Qualifies for Shingles Vaccine? Yes   Zostavax completed No   Shingrix Completed?: No.    Education has been provided regarding the importance of this vaccine. Patient has been advised to call insurance company to determine out of pocket expense if they have not yet received this vaccine. Advised may also receive vaccine at local pharmacy or Health Dept. Verbalized acceptance and understanding.  Screening Tests Health Maintenance  Topic Date Due   COVID-19 Vaccine (1) Never done   Zoster Vaccines- Shingrix (1 of 2) Never done   PNA vac Low Risk Adult (1 of 2 - PCV13) Never done   COLONOSCOPY (Pts 45-48yrs Insurance coverage will need to be confirmed)  04/20/2020   INFLUENZA VACCINE  06/15/2021   MAMMOGRAM  07/09/2022   DEXA SCAN  03/13/2023   TETANUS/TDAP  11/15/2026   Hepatitis C Screening  Completed   HPV VACCINES  Aged Out    Health Maintenance  Health Maintenance Due  Topic Date Due   COVID-19 Vaccine (1) Never done   Zoster Vaccines- Shingrix (1 of 2) Never done   PNA vac Low Risk Adult (1 of 2 - PCV13) Never done   COLONOSCOPY (Pts 45-23yrs Insurance coverage will need to be confirmed)  04/20/2020    Colorectal cancer screening: Referral to GI placed 2021. Pt aware the office will call re: appt. - she had to cancel her appt with [facility] - will call and reschedule  Mammogram status: Completed 07/12/2020. Repeat every year  Bone Density status: Completed 03/12/2021. Results reflect: Bone density results: OSTEOPENIA. Repeat  every 2 years.  Lung Cancer Screening: (Low Dose CT Chest recommended if Age 8-80 years, 30 pack-year currently smoking OR have quit w/in 15years.) does not qualify.   Additional Screening:  Hepatitis C Screening: does qualify; Completed 08/27/2015  Vision Screening: Recommended annual ophthalmology exams for early detection of glaucoma and other disorders of the eye. Is the patient up to date with their annual eye exam?  No  Who is the provider or what is the name of the office in which the patient attends annual eye exams? unknown If pt is not established with a provider, would they like to be referred to a provider to establish care? No .   Dental Screening: Recommended annual dental exams for proper oral hygiene  Community Resource Referral / Chronic Care Management: CRR required this visit?  No   CCM required this visit?  No      Plan:     I have personally reviewed and noted the following in the patient's chart:   Medical and social history Use of alcohol, tobacco or illicit drugs  Current medications and supplements including opioid prescriptions. Patient is not currently taking opioid prescriptions. Functional ability and status Nutritional status Physical activity Advanced directives List of other physicians Hospitalizations, surgeries, and ER visits in previous 12 months Vitals Screenings to include cognitive, depression, and falls Referrals and appointments  In addition, I have reviewed and discussed with patient certain preventive protocols, quality metrics, and best practice recommendations. A written personalized care plan for preventive services as well as general preventive health recommendations  were provided to patient.     [place] Constable, LPN   [id number]/4650   Nurse Notes: None

## 2021-06-11 NOTE — Patient Instructions (Signed)
Kristine Mata , Thank you for taking time to come for your Medicare Wellness Visit. I appreciate your ongoing commitment to your health goals. Please review the following plan we discussed and let me know if I can assist you in the future.   Screening recommendations/referrals: Colonoscopy: Keep appointment with [facility] 06/29/21 @ 10am Mammogram: Keep appointment 07/27/21 @ 9:30 Bone Density: Done 03/12/2021 - Repeat every 2 years  Recommended yearly ophthalmology/optometry visit for glaucoma screening and checkup Recommended yearly dental visit for hygiene and checkup  Vaccinations: Influenza vaccine: Due every fall Pneumococcal vaccine: Due. (2 doses one year apart) Tdap vaccine: Done 2018 - Repeat in 10 years  Shingles vaccine: Due. (2 doses 2-6 months apart, over 90% effective) Shingrix discussed. Please contact your pharmacy for coverage information.     Covid-19:Declined  Advanced directives: Please bring a copy of your health care power of attorney and living will to the office to be added to your chart at your convenience.   Conditions/risks identified: Aim for 30 minutes of exercise or brisk walking each day, drink 6-8 glasses of water and eat lots of fruits and vegetables.   Next appointment: Follow up in one year for your annual wellness visit    Preventive Care 65 Years and Older, Female Preventive care refers to lifestyle choices and visits with your health care provider that can promote health and wellness. What does preventive care include? A yearly physical exam. This is also called an annual well check. Dental exams once or twice a year. Routine eye exams. Ask your health care provider how often you should have your eyes checked. Personal lifestyle choices, including: Daily care of your teeth and gums. Regular physical activity. Eating a healthy diet. Avoiding tobacco and drug use. Limiting alcohol use. Practicing safe sex. Taking low-dose aspirin every  day. Taking vitamin and mineral supplements as recommended by your health care provider. What happens during an annual well check? The services and screenings done by your health care provider during your annual well check will depend on your age, overall health, lifestyle risk factors, and family history of disease. Counseling  Your health care provider may ask you questions about your: Alcohol use. Tobacco use. Drug use. Emotional well-being. Home and relationship well-being. Sexual activity. Eating habits. History of falls. Memory and ability to understand (cognition). Work and work Astronomer. Reproductive health. Screening  You may have the following tests or measurements: Height, weight, and BMI. Blood pressure. Lipid and cholesterol levels. These may be checked every 5 years, or more frequently if you are over 44 years old. Skin check. Lung cancer screening. You may have this screening every year starting at age 32 if you have a 30-pack-year history of smoking and currently smoke or have quit within the past 15 years. Fecal occult blood test (FOBT) of the stool. You may have this test every year starting at age 8. Flexible sigmoidoscopy or colonoscopy. You may have a sigmoidoscopy every 5 years or a colonoscopy every 10 years starting at age 27. Hepatitis C blood test. Hepatitis B blood test. Sexually transmitted disease (STD) testing. Diabetes screening. This is done by checking your blood sugar (glucose) after you have not eaten for a while (fasting). You may have this done every 1-3 years. Bone density scan. This is done to screen for osteoporosis. You may have this done starting at age 77. Mammogram. This may be done every 1-2 years. Talk to your health care provider about how often you should have regular mammograms.  Talk with your health care provider about your test results, treatment options, and if necessary, the need for more tests. Vaccines  Your health care  provider may recommend certain vaccines, such as: Influenza vaccine. This is recommended every year. Tetanus, diphtheria, and acellular pertussis (Tdap, Td) vaccine. You may need a Td booster every 10 years. Zoster vaccine. You may need this after age 33. Pneumococcal 13-valent conjugate (PCV13) vaccine. One dose is recommended after age 28. Pneumococcal polysaccharide (PPSV23) vaccine. One dose is recommended after age 55. Talk to your health care provider about which screenings and vaccines you need and how often you need them. This information is not intended to replace advice given to you by your health care provider. Make sure you discuss any questions you have with your health care provider. Document Released: 11/28/2015 Document Revised: 07/21/2016 Document Reviewed: 09/02/2015 Elsevier Interactive Patient Education  2017 [employer] Prevention in the Home Falls can cause injuries. They can happen to people of all ages. There are many things you can do to make your home safe and to help prevent falls. What can I do on the outside of my home? Regularly fix the edges of walkways and driveways and fix any cracks. Remove anything that might make you trip as you walk through a door, such as a raised step or threshold. Trim any bushes or trees on the path to your home. Use bright outdoor lighting. Clear any walking paths of anything that might make someone trip, such as rocks or tools. Regularly check to see if handrails are loose or broken. Make sure that both sides of any steps have handrails. Any raised decks and porches should have guardrails on the edges. Have any leaves, snow, or ice cleared regularly. Use sand or salt on walking paths during winter. Clean up any spills in your garage right away. This includes oil or grease spills. What can I do in the bathroom? Use night lights. Install grab bars by the toilet and in the tub and shower. Do not use towel bars as grab  bars. Use non-skid mats or decals in the tub or shower. If you need to sit down in the shower, use a plastic, non-slip stool. Keep the floor dry. Clean up any water that spills on the floor as soon as it happens. Remove soap buildup in the tub or shower regularly. Attach bath mats securely with double-sided non-slip rug tape. Do not have throw rugs and other things on the floor that can make you trip. What can I do in the bedroom? Use night lights. Make sure that you have a light by your bed that is easy to reach. Do not use any sheets or blankets that are too big for your bed. They should not hang down onto the floor. Have a firm chair that has side arms. You can use this for support while you get dressed. Do not have throw rugs and other things on the floor that can make you trip. What can I do in the kitchen? Clean up any spills right away. Avoid walking on wet floors. Keep items that you use a lot in easy-to-reach places. If you need to reach something above you, use a strong step stool that has a grab bar. Keep electrical cords out of the way. Do not use floor polish or wax that makes floors slippery. If you must use wax, use non-skid floor wax. Do not have throw rugs and other things on the floor that can make  you trip. What can I do with my stairs? Do not leave any items on the stairs. Make sure that there are handrails on both sides of the stairs and use them. Fix handrails that are broken or loose. Make sure that handrails are as long as the stairways. Check any carpeting to make sure that it is firmly attached to the stairs. Fix any carpet that is loose or worn. Avoid having throw rugs at the top or bottom of the stairs. If you do have throw rugs, attach them to the floor with carpet tape. Make sure that you have a light switch at the top of the stairs and the bottom of the stairs. If you do not have them, ask someone to add them for you. What else can I do to help prevent  falls? Wear shoes that: Do not have high heels. Have rubber bottoms. Are comfortable and fit you well. Are closed at the toe. Do not wear sandals. If you use a stepladder: Make sure that it is fully opened. Do not climb a closed stepladder. Make sure that both sides of the stepladder are locked into place. Ask someone to hold it for you, if possible. Clearly mark and make sure that you can see: Any grab bars or handrails. First and last steps. Where the edge of each step is. Use tools that help you move around (mobility aids) if they are needed. These include: Canes. Walkers. Scooters. Crutches. Turn on the lights when you go into a dark area. Replace any light bulbs as soon as they burn out. Set up your furniture so you have a clear path. Avoid moving your furniture around. If any of your floors are uneven, fix them. If there are any pets around you, be aware of where they are. Review your medicines with your doctor. Some medicines can make you feel dizzy. This can increase your chance of falling. Ask your doctor what other things that you can do to help prevent falls. This information is not intended to replace advice given to you by your health care provider. Make sure you discuss any questions you have with your health care provider. Document Released: 08/28/2009 Document Revised: 04/08/2016 Document Reviewed: 12/06/2014 Elsevier Interactive Patient Education  2017 [employer].

## 2021-06-12 ENCOUNTER — Other Ambulatory Visit: Payer: Self-pay | Admitting: Nurse Practitioner

## 2021-06-12 DIAGNOSIS — U071 COVID-19: Secondary | ICD-10-CM

## 2021-06-12 MED ORDER — BENZONATATE 100 MG PO CAPS: 100.0000 mg | ORAL_CAPSULE | Freq: Three times a day (TID) | ORAL | 0 refills | Status: DC | PRN

## 2021-06-12 MED ORDER — DM-GUAIFENESIN ER 30-600 MG PO TB12: 1.0000 | ORAL_TABLET | Freq: Two times a day (BID) | ORAL | 0 refills | Status: DC

## 2021-06-12 NOTE — Telephone Encounter (Deleted)
-----   Message from Daryll Drown, NP sent at 06/12/2021 11:54 AM EDT ----- Regarding: RE: cough/wheeze - covid Contact: [phone] Looks like she is outside the window for her antiviral. If she gets wore over the weekend please seek emergency care, will send guaifenesin/benzonatate to help with cough and congestion to the pharmacy.  But please notify patient that cough takes  long time to resolve.  With fever Tylenol as needed.   Thanks  OI  ----- Message ----- From: Arizona Constable, LPN Sent: 06/24/1750  11:17 AM EDT To: Daryll Drown, NP Subject: cough/wheeze - covid                           She saw you 7/25, dx with covid 7/26 - antiviral was called in, but she never picked it up. She has continued the azithromycin. She is coughing a lot and phlegm gets stuck in throat, but she cannot get it up, and every night she is wheezing and having trouble sleeping - wants to know what she can do for this, please. Thanks

## 2021-06-27 NOTE — Progress Notes (Deleted)
Referring Provider:Joyce, Robin Searing, [name] Primary Care Physician:  Gwenlyn Fudge, [name] Primary Gastroenterologist:  Dr. Jena Gauss  No chief complaint on file.   HPI:   Kristine Mata is a 65 y.o. female presenting today at the request of Gwenlyn Fudge, [name] for consult colonoscopy.   Last colonoscopy June 2016 at [facility] with 2 polyps removed. Path with hyperplastic polyp, tubular adenoma. Ileal biopsy benign.    Past Medical History:  Diagnosis Date   Ectopic pregnancy    Heart murmur    Hyperlipidemia    Hypertension    Insomnia, unspecified    Osteopenia 03/12/2021   Right knee pain    SVT (supraventricular tachycardia) (HCC)     Past Surgical History:  Procedure Laterality Date   ABDOMINAL HYSTERECTOMY     cyst left breast      SALPINGECTOMY Left    TMJ ARTHROPLASTY      Current Outpatient Medications  Medication Sig Dispense Refill   amoxicillin-clavulanate (AUGMENTIN) 875-125 MG tablet Take 1 tablet by mouth 2 (two) times daily. 14 tablet 0   benzonatate (TESSALON PERLES) 100 MG capsule Take 1 capsule (100 mg total) by mouth 3 (three) times daily as needed for cough. 20 capsule 0   dextromethorphan-guaiFENesin (MUCINEX DM) 30-600 MG 12hr tablet Take 1 tablet by mouth 2 (two) times daily. 30 tablet 0   famotidine (PEPCID) 20 MG tablet Take 1 tablet (20 mg total) by mouth 2 (two) times daily as needed for heartburn or indigestion. 60 tablet 2   fluticasone (FLONASE) 50 MCG/ACT nasal spray Place 2 sprays into both nostrils daily. 16 g 6   furosemide (LASIX) 40 MG tablet Take 1 tablet (40 mg total) by mouth daily. 90 tablet 3   metoprolol (TOPROL-XL) 200 MG 24 hr tablet Take 1 tablet (200 mg total) by mouth daily. 90 tablet 3   potassium chloride (KLOR-CON) 10 MEQ tablet Take 1 tablet (10 mEq total) by mouth daily. 90 tablet 3   pravastatin (PRAVACHOL) 40 MG tablet Take 1 tablet (40 mg total) by mouth daily. 30 tablet 2   predniSONE (STERAPRED UNI-PAK 21 TAB) 10 MG  (21) TBPK tablet 6 tablet day 1, 5 tablet day 2, 4 tablet day 3, 3 tablet day 4, 2 tablet day 5, 1 tablet day 6 1 each 0   No current facility-administered medications for this visit.    Allergies as of 06/29/2021 - Review Complete 06/11/2021  Allergen Reaction Noted   Anesthesia s-i-40 [propofol] Nausea And Vomiting 01/28/2021   Codeine Nausea And Vomiting    Naproxen Palpitations 01/28/2021    Family History  Problem Relation Age of Onset   Heart disease Mother        used nitroglycerin   Hypertension Mother    Thyroid disease Mother    Pancreatic cancer Mother    Hypertension Father    Diabetes Sister    Hypertension Sister    Hypertension Brother    Thyroid disease Brother    Diabetes Brother    Hypertension Sister    Colon cancer Sister    Hypertension Brother    COPD Brother    Heart disease Brother    Stroke Brother    COPD Brother    Lung disease Brother    Stroke Maternal Grandmother     Social History   Socioeconomic History   Marital status: Divorced    Spouse name: Not on file   Number of children: 1   Years of education: Not  on file   Highest education level: Not on file  Occupational History   Occupation: Retired  Tobacco Use   Smoking status: Never   Smokeless tobacco: Never  Vaping Use   Vaping Use: Never used  Substance and Sexual Activity   Alcohol use: Never   Drug use: Never   Sexual activity: Not Currently  Other Topics Concern   Not on file  Social History Narrative   Lives alone. Has one son - lives in [place].   Social Determinants of Health   Financial Resource Strain: Low Risk    Difficulty of Paying Living Expenses: Not very hard  Food Insecurity: No Food Insecurity   Worried About Programme researcher, broadcasting/film/video in the Last Year: Never true   Barista in the Last Year: Never true  Transportation Needs: No Transportation Needs   Lack of Transportation (Medical): No   Lack of Transportation (Non-Medical): No  Physical Activity:  Insufficiently Active   Days of Exercise per Week: 5 days   Minutes of Exercise per Session: 20 min  Stress: Stress Concern Present   Feeling of Stress : To some extent  Social Connections: Moderately Isolated   Frequency of Communication with Friends and Family: More than three times a week   Frequency of Social Gatherings with Friends and Family: Twice a week   Attends Religious Services: More than 4 times per year   Active Member of [employer] or Organizations: No   Attends Engineer, structural: Never   Marital Status: Divorced  Catering manager Violence: Not At Risk   Fear of Current or Ex-Partner: No   Emotionally Abused: No   Physically Abused: No   Sexually Abused: No    Review of Systems: Gen: Denies any fever, chills, fatigue, weight loss, lack of appetite.  CV: Denies chest pain, heart palpitations, peripheral edema, syncope.  Resp: Denies shortness of breath at rest or with exertion. Denies wheezing or cough.  GI: Denies dysphagia or odynophagia. Denies jaundice, hematemesis, fecal incontinence. GU : Denies urinary burning, urinary frequency, urinary hesitancy MS: Denies joint pain, muscle weakness, cramps, or limitation of movement.  Derm: Denies rash, itching, dry skin Psych: Denies depression, anxiety, memory loss, and confusion Heme: Denies bruising, bleeding, and enlarged lymph nodes.  Physical Exam: There were no vitals taken for this visit. General:   Alert and oriented. Pleasant and cooperative. Well-nourished and well-developed.  Head:  Normocephalic and atraumatic. Eyes:  Without icterus, sclera clear and conjunctiva pink.  Ears:  Normal auditory acuity. Nose:  No deformity, discharge,  or lesions. Mouth:  No deformity or lesions, oral mucosa pink.  Neck:  Supple, without mass or thyromegaly. Lungs:  Clear to auscultation bilaterally. No wheezes, rales, or rhonchi. No distress.  Heart:  S1, S2 present without murmurs appreciated.  Abdomen:  +BS, soft,  non-tender and non-distended. No HSM noted. No guarding or rebound. No masses appreciated.  Rectal:  Deferred  Msk:  Symmetrical without gross deformities. Normal posture. Pulses:  Normal pulses noted. Extremities:  Without clubbing or edema. Neurologic:  Alert and  oriented x4;  grossly normal neurologically. Skin:  Intact without significant lesions or rashes. Cervical Nodes:  No significant cervical adenopathy. Psych:  Alert and cooperative. Normal mood and affect.

## 2021-06-29 ENCOUNTER — Encounter: Payer: Self-pay | Admitting: Internal Medicine

## 2021-06-29 ENCOUNTER — Ambulatory Visit: Payer: Medicare HMO | Admitting: Gastroenterology

## 2021-07-27 ENCOUNTER — Other Ambulatory Visit: Payer: Self-pay

## 2021-07-27 ENCOUNTER — Ambulatory Visit
Admission: RE | Admit: 2021-07-27 | Discharge: 2021-07-27 | Disposition: A | Discharge status: Home / Self Care | Payer: Medicare HMO | Source: Ambulatory Visit | Attending: Family Medicine | Admitting: Family Medicine

## 2021-07-27 DIAGNOSIS — Z1231 Encounter for screening mammogram for malignant neoplasm of breast: Secondary | ICD-10-CM | POA: Diagnosis not present

## 2021-08-25 ENCOUNTER — Ambulatory Visit (INDEPENDENT_AMBULATORY_CARE_PROVIDER_SITE_OTHER): Payer: Medicare HMO | Admitting: Family Medicine

## 2021-08-25 ENCOUNTER — Encounter: Payer: Self-pay | Admitting: Family Medicine

## 2021-08-25 DIAGNOSIS — J069 Acute upper respiratory infection, unspecified: Secondary | ICD-10-CM

## 2021-08-25 NOTE — Progress Notes (Signed)
Virtual Visit via Telephone Note  I connected with Kristine Mata on 08/25/21 at 12:10 PM by telephone and verified that I am speaking with the correct person using two identifiers. Kristine Mata is currently located at home and nobody is currently with her during this visit. The provider, Gwenlyn Fudge, [name] is located in their office at time of visit.  I discussed the limitations, risks, security and privacy concerns of performing an evaluation and management service by telephone and the availability of in person appointments. I also discussed with the patient that there may be a patient responsible charge related to this service. The patient expressed understanding and agreed to proceed.  Subjective: PCP: Gwenlyn Fudge, [name]  Chief Complaint  Patient presents with   Sore Throat   Otalgia   Patient complains of sore throat and ear pain/pressure. Additional symptoms include head congestion, headache, runny nose, sore throat, and facial pain/pressure. Onset of symptoms was 1 day ago, gradually worsening since that time. She is drinking plenty of fluids. Evaluation to date: none. Treatment to date: nasal steroids, Coriciden, and Mucinex-DM. She does not smoke.    ROS: Per HPI  Current Outpatient Medications:    famotidine (PEPCID) 20 MG tablet, Take 1 tablet (20 mg total) by mouth 2 (two) times daily as needed for heartburn or indigestion., Disp: 60 tablet, Rfl: 2   fluticasone (FLONASE) 50 MCG/ACT nasal spray, Place 2 sprays into both nostrils daily., Disp: 16 g, Rfl: 6   furosemide (LASIX) 40 MG tablet, Take 1 tablet (40 mg total) by mouth daily., Disp: 90 tablet, Rfl: 3   metoprolol (TOPROL-XL) 200 MG 24 hr tablet, Take 1 tablet (200 mg total) by mouth daily., Disp: 90 tablet, Rfl: 3   potassium chloride (KLOR-CON) 10 MEQ tablet, Take 1 tablet (10 mEq total) by mouth daily., Disp: 90 tablet, Rfl: 3   pravastatin (PRAVACHOL) 40 MG tablet, Take 1 tablet (40 mg total) by mouth  daily., Disp: 30 tablet, Rfl: 2  Allergies  Allergen Reactions   Anesthesia S-I-40 [Propofol] Nausea And Vomiting   Codeine Nausea And Vomiting   Naproxen Palpitations   Past Medical History:  Diagnosis Date   Ectopic pregnancy    Heart murmur    Hyperlipidemia    Hypertension    Insomnia, unspecified    Osteopenia 03/12/2021   Right knee pain    SVT (supraventricular tachycardia) (HCC)     Observations/Objective: A&O  No respiratory distress or wheezing audible over the phone Mood, judgement, and thought processes all WNL  Assessment and Plan: 1. Viral URI Discussed symptom management and typical duration of viral illness. Advised to let us know if she is not better in 7-10 days.   Follow Up Instructions:  I discussed the assessment and treatment plan with the patient. The patient was provided an opportunity to ask questions and all were answered. The patient agreed with the plan and demonstrated an understanding of the instructions.   The patient was advised to call back or seek an in-person evaluation if the symptoms worsen or if the condition fails to improve as anticipated.  The above assessment and management plan was discussed with the patient. The patient verbalized understanding of and has agreed to the management plan. Patient is aware to call the clinic if symptoms persist or worsen. Patient is aware when to return to the clinic for a follow-up visit. Patient educated on when it is appropriate to go to the emergency department.   Time  call ended: 12:21 PM  I provided 11 minutes of non-face-to-face time during this encounter.  Deliah Boston, MSN, APRN, [name]-C [facility] 08/25/21

## 2021-09-03 ENCOUNTER — Ambulatory Visit (INDEPENDENT_AMBULATORY_CARE_PROVIDER_SITE_OTHER): Payer: Medicare HMO | Admitting: Family Medicine

## 2021-09-03 ENCOUNTER — Other Ambulatory Visit: Payer: Self-pay

## 2021-09-03 VITALS — BP 122/88 | HR 61 | Temp 98.1°F | Ht 70.0 in | Wt 309.4 lb

## 2021-09-03 DIAGNOSIS — I10 Essential (primary) hypertension: Secondary | ICD-10-CM

## 2021-09-03 DIAGNOSIS — Z9189 Other specified personal risk factors, not elsewhere classified: Secondary | ICD-10-CM

## 2021-09-03 DIAGNOSIS — E782 Mixed hyperlipidemia: Secondary | ICD-10-CM

## 2021-09-03 DIAGNOSIS — M1711 Unilateral primary osteoarthritis, right knee: Secondary | ICD-10-CM | POA: Diagnosis not present

## 2021-09-03 DIAGNOSIS — F4321 Adjustment disorder with depressed mood: Secondary | ICD-10-CM | POA: Diagnosis not present

## 2021-09-03 DIAGNOSIS — R69 Illness, unspecified: Secondary | ICD-10-CM | POA: Diagnosis not present

## 2021-09-03 MED ORDER — TRAZODONE HCL 50 MG PO TABS: 25.0000 mg | ORAL_TABLET | Freq: Every day | ORAL | 2 refills | Status: DC

## 2021-09-03 MED ORDER — LOVASTATIN 40 MG PO TABS: 40.0000 mg | ORAL_TABLET | Freq: Every day | ORAL | 2 refills | Status: DC

## 2021-09-03 NOTE — Patient Instructions (Addendum)
[facility] Phone: [phone]  Stop your pravastatin x2 weeks to see if your pain goes away.   Circaid compression aid for your leg swelling.  [facility] Phone: [phone]

## 2021-09-03 NOTE — Progress Notes (Signed)
Assessment & Plan:  1. Essential hypertension - Well controlled on current regimen - encouraged to take BP at home and keep a log - encouraged healthy diet and exercise - CMP14+EGFR - Lipid panel  2. Mixed hyperlipidemia - not tolerating pravastatin, will change to lovastatin as she has failed atorvastatin and rosuvastatin in the past - CMP14+EGFR - Lipid panel - lovastatin (MEVACOR) 40 MG tablet; Take 1 tablet (40 mg total) by mouth at bedtime.  Dispense: 30 tablet; Refill: 2  3. Grief reaction - initiate trazodone to help with sleep - traZODone (DESYREL) 50 MG tablet; Take 0.5-1 tablets (25-50 mg total) by mouth at bedtime.  Dispense: 30 tablet; Refill: 2  4. Primary osteoarthritis of right knee - phone number for orthopedic given to patient as referral was placed previously  5. At risk for sleep apnea - phone number for pulmonology given to patient as referral was previously placed  6. Morbid obesity ([place]) - encouraged healthy diet and exercise   Return in about 6 weeks (around 10/15/2021) for Sleep/Grief (may be telephone).  Lucile Crater, NP Student  I personally was present during the history, physical exam, and medical decision-making activities of this service and have verified that the service and findings are accurately documented in the nurse practitioner student's note.  Hendricks Limes, MSN, APRN, [name]-C [facility]   Subjective:    Patient ID: Kristine Mata, female    DOB: [birth year], 65 y.o.   MRN: [id number]  Patient Care Team: Loman Brooklyn, [name] as PCP - General (Family Medicine) Herminio Commons, MD (Inactive) as PCP - Cardiology (Cardiology) Daneil Dolin, MD as Consulting Physician (Gastroenterology)   Chief Complaint:  Chief Complaint  Patient presents with   Hypertension   Hyperlipidemia   Knee Pain    Right - ongoing    Depression    Sister passed away end of June 30, 2023     HPI: Kristine Mata is a 65 y.o.  female presenting on 09/03/2021 for Hypertension, Hyperlipidemia, Knee Pain (Right - ongoing ), and Depression (Sister passed away end of 06-30-23 )  Hypertension: Patient here for follow-up of elevated blood pressure. She is exercising and is adherent to low salt diet.  She does not check her blood pressure at home due to a broken cuff. Cardiac symptoms lower extremity edema.  Cardiovascular risk factors: dyslipidemia, hypertension, and obesity (BMI >= 30 kg/m2). Use of agents associated with hypertension: none. History of target organ damage: none. She states she has swelling in her left ankle that is not always relieved with her daily lasix.   Hyperlipidemia: she is on pravastatin and states that it causes left chest wall pain and headaches when she takes it. She was told by her cardiologist to stop for two weeks and see if her symptoms resolved. She states that it did improve her symptoms when it was stopped. She has previously failed atorvastatin, rosuvastatin, and now pravastatin.   The 10-year ASCVD risk score (Arnett DK, et al., 2019) is: 8.3%   Values used to calculate the score:     Age: 89 years     Sex: Female     Is Non-Hispanic African American: Yes     Diabetic: No     Tobacco smoker: No     Systolic Blood Pressure: 751 mmHg     Is BP treated: Yes     HDL Cholesterol: 78 mg/dL     Total Cholesterol: 202 mg/dL  Knee pain: She has  severe right osteoarthritis. She only takes tylenol for the pain but it is not very helpful. She has had steroid injections in the past that have helped. She was on Naproxen at one point but she did not tolerate it well. She uses CBD and voltaran gel on the knee and it helps somewhat. She states at night she can feel pain shooting up the knee to her thigh. She saw an orthopedic specialist in [place] last year who did the last knee injection and was given a new referral this April. She has not gone to them yet or scheduled an appointment.   Depression: Her sister  passed away at the end of 30-Jun-2023 this year. She states she is not getting much sleep as a result of this stressful time.   Depression screen [facility] 2/9 09/03/2021 06/11/2021 10/27/2020  Decreased Interest 0 0 0  Down, Depressed, Hopeless 1 1 0  PHQ - 2 Score 1 1 0  Altered sleeping 1 - -  Tired, decreased energy 0 - -  Change in appetite 0 - -  Feeling bad or failure about yourself  0 - -  Trouble concentrating 0 - -  Moving slowly or fidgety/restless 0 - -  Suicidal thoughts 0 - -  PHQ-9 Score 2 - -  Difficult doing work/chores Not difficult at all - -   GAD 7 : Generalized Anxiety Score 09/03/2021 07/15/2020 03/13/2020  Nervous, Anxious, on Edge 0 0 0  Control/stop worrying 0 0 0  Worry too much - different things _0 Trouble relaxing _1 Restless 0 0 0  Easily annoyed or irritable 0 0 0  Afraid - awful might happen 1 0 0  Total GAD 7 Score _2 Anxiety Difficulty Somewhat difficult - -   Questionable Sleep Apnea: At a previous appointment she was referred to pulmonology for a sleep study, and it appears she has not scheduled the appointment.    Social history:  Relevant past medical, surgical, family and social history reviewed and updated as indicated. Interim medical history since our last visit reviewed.  Allergies and medications reviewed and updated.  DATA REVIEWED: CHART IN EPIC  ROS: Negative unless specifically indicated above in HPI.    Current Outpatient Medications:    fluticasone (FLONASE) 50 MCG/ACT nasal spray, Place 2 sprays into both nostrils daily., Disp: 16 g, Rfl: 6   furosemide (LASIX) 40 MG tablet, Take 1 tablet (40 mg total) by mouth daily., Disp: 90 tablet, Rfl: 3   metoprolol (TOPROL-XL) 200 MG 24 hr tablet, Take 1 tablet (200 mg total) by mouth daily., Disp: 90 tablet, Rfl: 3   potassium chloride (KLOR-CON) 10 MEQ tablet, Take 1 tablet (10 mEq total) by mouth daily., Disp: 90 tablet, Rfl: 3   pravastatin (PRAVACHOL) 40 MG tablet, Take 1 tablet  (40 mg total) by mouth daily., Disp: 30 tablet, Rfl: 2   traZODone (DESYREL) 50 MG tablet, Take 0.5-1 tablets (25-50 mg total) by mouth at bedtime., Disp: 30 tablet, Rfl: 2   Allergies  Allergen Reactions   Anesthesia S-I-40 [Propofol] Nausea And Vomiting   Codeine Nausea And Vomiting   Naproxen Palpitations   Past Medical History:  Diagnosis Date   Ectopic pregnancy    Heart murmur    Hyperlipidemia    Hypertension    Insomnia, unspecified    Osteopenia 03/12/2021   Right knee pain    SVT (supraventricular tachycardia) (HCC)     Past Surgical History:  Procedure Laterality Date   ABDOMINAL HYSTERECTOMY     BREAST CYST EXCISION Left    65 years old   cyst left breast      SALPINGECTOMY Left    TMJ ARTHROPLASTY      Social History   Socioeconomic History   Marital status: Divorced    Spouse name: Not on file   Number of children: 1   Years of education: Not on file   Highest education level: Not on file  Occupational History   Occupation: Retired  Tobacco Use   Smoking status: Never   Smokeless tobacco: Never  Vaping Use   Vaping Use: Never used  Substance and Sexual Activity   Alcohol use: Never   Drug use: Never   Sexual activity: Not Currently  Other Topics Concern   Not on file  Social History Narrative   Lives alone. Has one son - lives in [place].   Social Determinants of Health   Financial Resource Strain: Low Risk    Difficulty of Paying Living Expenses: Not very hard  Food Insecurity: No Food Insecurity   Worried About Charity fundraiser in the Last Year: Never true   Arboriculturist in the Last Year: Never true  Transportation Needs: No Transportation Needs   Lack of Transportation (Medical): No   Lack of Transportation (Non-Medical): No  Physical Activity: Insufficiently Active   Days of Exercise per Week: 5 days   Minutes of Exercise per Session: 20 min  Stress: Stress Concern Present   Feeling of Stress : To some extent  Social  Connections: Moderately Isolated   Frequency of Communication with Friends and Family: More than three times a week   Frequency of Social Gatherings with Friends and Family: Twice a week   Attends Religious Services: More than 4 times per year   Active Member of [employer] or Organizations: No   Attends Archivist Meetings: Never   Marital Status: Divorced  Human resources officer Violence: Not At Risk   Fear of Current or Ex-Partner: No   Emotionally Abused: No   Physically Abused: No   Sexually Abused: No        Objective:    BP 122/88   Pulse 61   Temp 98.1 F (36.7 C) (Temporal)   Ht _0  (1.778 m)   Wt (!) 140.3 kg   SpO2 97%   BMI 44.39 kg/m   Wt Readings from Last 3 Encounters:  09/03/21 (!) 309 lb 6.4 oz (140.3 kg)  06/11/21 (!) 318 lb (144.2 kg)  03/20/21 (!) 318 lb (144.2 kg)    Physical Exam Vitals reviewed.  Constitutional:      General: She is not in acute distress.    Appearance: Normal appearance. She is morbidly obese. She is not ill-appearing, toxic-appearing or diaphoretic.  HENT:     Head: Normocephalic and atraumatic.     Nose: Nose normal.     Mouth/Throat:     Mouth: Mucous membranes are moist.     Pharynx: Oropharynx is clear.  Eyes:     Extraocular Movements: Extraocular movements intact.     Conjunctiva/sclera: Conjunctivae normal.     Pupils: Pupils are equal, round, and reactive to light.  Cardiovascular:     Rate and Rhythm: Normal rate and regular rhythm.     Pulses: Normal pulses.     Heart sounds: Normal heart sounds.  Pulmonary:     Effort: Pulmonary effort is normal. No respiratory distress.  Breath sounds: Normal breath sounds.  Abdominal:     General: There is no distension.     Palpations: Abdomen is soft. There is no mass.  Musculoskeletal:     Cervical back: Normal range of motion.     Right knee: Decreased range of motion. Tenderness present.     Left lower leg: Edema present.  Skin:    General: Skin is warm and  dry.  Neurological:     General: No focal deficit present.     Mental Status: She is alert and oriented to person, place, and time.     Motor: No weakness.     Gait: Gait normal.  Psychiatric:        Mood and Affect: Mood normal.        Behavior: Behavior normal.        Thought Content: Thought content normal.        Judgment: Judgment normal.    Lab Results  Component Value Date   TSH 0.627 03/10/2021   Lab Results  Component Value Date   WBC 6.9 03/10/2021   HGB 12.7 03/10/2021   HCT 39.5 03/10/2021   MCV 90 03/10/2021   PLT 293 03/10/2021   Lab Results  Component Value Date   NA 143 03/10/2021   K 4.1 03/10/2021   CO2 27 03/10/2021   GLUCOSE 108 (H) 03/10/2021   BUN 9 03/10/2021   CREATININE 0.59 03/10/2021   BILITOT 0.4 03/10/2021   ALKPHOS 164 (H) 03/10/2021   AST 21 03/10/2021   ALT 15 03/10/2021   PROT 7.2 03/10/2021   ALBUMIN 4.7 03/10/2021   CALCIUM 10.5 (H) 03/10/2021   ANIONGAP 12 03/17/2018   EGFR 99 03/10/2021   Lab Results  Component Value Date   CHOL 202 (H) 03/10/2021   Lab Results  Component Value Date   HDL 78 03/10/2021   Lab Results  Component Value Date   LDLCALC 111 (H) 03/10/2021   Lab Results  Component Value Date   TRIG 75 03/10/2021   Lab Results  Component Value Date   CHOLHDL 2.6 03/10/2021   No results found for: HGBA1C

## 2021-09-04 LAB — CMP14+EGFR
ALT: 15 IU/L (ref 0–32)
AST: 20 IU/L (ref 0–40)
Albumin/Globulin Ratio: 2 (ref 1.2–2.2)
Albumin: 4.7 g/dL (ref 3.8–4.8)
Alkaline Phosphatase: 153 IU/L — ABNORMAL HIGH (ref 44–121)
BUN/Creatinine Ratio: 15 (ref 12–28)
BUN: 9 mg/dL (ref 8–27)
Bilirubin Total: 0.3 mg/dL (ref 0.0–1.2)
CO2: 28 mmol/L (ref 20–29)
Calcium: 10.9 mg/dL — ABNORMAL HIGH (ref 8.7–10.3)
Chloride: 103 mmol/L (ref 96–106)
Creatinine, Ser: 0.61 mg/dL (ref 0.57–1.00)
Globulin, Total: 2.4 g/dL (ref 1.5–4.5)
Glucose: 104 mg/dL — ABNORMAL HIGH (ref 70–99)
Potassium: 4.6 mmol/L (ref 3.5–5.2)
Sodium: 143 mmol/L (ref 134–144)
Total Protein: 7.1 g/dL (ref 6.0–8.5)
eGFR: 99 mL/min/{1.73_m2} (ref >59)

## 2021-09-04 LAB — LIPID PANEL
Chol/HDL Ratio: 2.8 ratio (ref 0.0–4.4)
Cholesterol, Total: 202 mg/dL — ABNORMAL HIGH (ref 100–199)
HDL: 73 mg/dL (ref >39)
LDL Chol Calc (NIH): 115 mg/dL — ABNORMAL HIGH (ref 0–99)
Triglycerides: 77 mg/dL (ref 0–149)
VLDL Cholesterol Cal: 14 mg/dL (ref 5–40)

## 2021-09-07 ENCOUNTER — Encounter: Payer: Self-pay | Admitting: Family Medicine

## 2021-09-07 DIAGNOSIS — M1711 Unilateral primary osteoarthritis, right knee: Secondary | ICD-10-CM | POA: Insufficient documentation

## 2021-09-15 ENCOUNTER — Telehealth: Payer: Self-pay | Admitting: Family Medicine

## 2021-09-15 NOTE — Telephone Encounter (Signed)
Kristine Mata is calling requesting her heart monitor results. She states her PCP advised her to call and request the results as well as see if she is needing to be seen in regards to them. Kristine Mata had changed her number at the time she was contacted in regards to this and it had not been updated in the system until recently.

## 2021-09-16 NOTE — Telephone Encounter (Signed)
Advised the cardiac results were given in May 2022 Wanted to know if she needed to follow up Advised that she was schedule for a 6 week follow up in June and July 2022 and she canceled both appointments Gave appointment to see Nena Polio, NP 09/18/21 @8 :00 am

## 2021-09-16 NOTE — Telephone Encounter (Signed)
    Pt is calling back to follow up  

## 2021-09-17 NOTE — Progress Notes (Signed)
Cardiology Office Note  Date: 09/18/2021   ID: KRYSTEENA STALKER, DOB [birth year], MRN [id number]  PCP:  Gwenlyn Fudge, [name]  Cardiologist:  None Electrophysiologist:  None   Chief Complaint: PSVT / tachcycardia  History of Present Illness: Kristine Mata is a 65 y.o. female with a history of PSVT, HTN, HLD, GERD, morbid obesity.    Last seen by Dr. Purvis Sheffield via telemedicine 09/26/2019.  She was experiencing palpitations about 4 times per week but only occurred at night while sleeping.  She was occasionally awakened by the symptoms.  She denied any exertional chest pain or dyspnea.  She stated her first episode of SVT occurred around 72.  She had been walking to try to lose weight.  She was continuing Toprol-XL 200 mg daily but continued to experience symptoms about 4 times per week.  A 2-week event monitor was ordered.  Blood pressure was borderline elevated.  There were no changes to therapy.  She was last here after being referred by PCP for palpitations.  She had a 14-day ZIO monitor.  Monitor and showed PACs and PVCs, 1 episode of NSVT 5 beats.  She had Few episodes of SVT relatively brief with the longest lasting 13 beats.  She had one 2.8-second pause.  No sustained arrhythmias or frequent pauses.  She does states she snores at night and wakes up with palpitations/rapid heart rate.  She has family members with sleep apnea.  She states her PCP initially referred her for sleep study but she was unable to keep it.  She is requesting to be referred for sleep consultation.  She states she stopped her statin and chest discomfort had improved but her primary placed her on a lovastatin 40 mg daily.  Still has some minor discomfort but states it is better.  She is continuing Lasix 40 mg daily, Toprol-XL 200 mg daily, potassium 10 mEq.  She states she would like to lose some weight but she has a bad knee on the right side and this limits her ability to exercise or walk to any great degree.   Pressure is elevated today which she states she was nervous and in a hurry to get here today.  Previous blood pressure was within normal limits.    Past Medical History:  Diagnosis Date   Ectopic pregnancy    Heart murmur    Hyperlipidemia    Hypertension    Insomnia, unspecified    Osteopenia 03/12/2021   Right knee pain    SVT (supraventricular tachycardia) (HCC)     Past Surgical History:  Procedure Laterality Date   ABDOMINAL HYSTERECTOMY     BREAST CYST EXCISION Left    65 years old   cyst left breast      SALPINGECTOMY Left    TMJ ARTHROPLASTY      Current Outpatient Medications  Medication Sig Dispense Refill   fluticasone (FLONASE) 50 MCG/ACT nasal spray Place 2 sprays into both nostrils daily. 16 g 6   furosemide (LASIX) 40 MG tablet Take 1 tablet (40 mg total) by mouth daily. 90 tablet 3   lovastatin (MEVACOR) 40 MG tablet Take 1 tablet (40 mg total) by mouth at bedtime. 30 tablet 2   metoprolol (TOPROL-XL) 200 MG 24 hr tablet Take 1 tablet (200 mg total) by mouth daily. 90 tablet 3   potassium chloride (KLOR-CON) 10 MEQ tablet Take 1 tablet (10 mEq total) by mouth daily. 90 tablet 3   traZODone (DESYREL) 50 MG tablet Take  0.5-1 tablets (25-50 mg total) by mouth at bedtime. 30 tablet 2   No current facility-administered medications for this visit.   Allergies:  Anesthesia s-i-40 [propofol], Codeine, and Naproxen   Social History: The patient  reports that she has never smoked. She has never used smokeless tobacco. She reports that she does not drink alcohol and does not use drugs.   Family History: The patient's family history includes COPD in her brother and brother; Colon cancer in her sister; Diabetes in her brother and sister; Heart disease in her brother and mother; Hypertension in her brother, brother, father, mother, sister, and sister; Lung disease in her brother; Pancreatic cancer in her mother; Stroke in her brother and maternal grandmother; Thyroid  disease in her brother and mother.   ROS:  Please see the history of present illness. Otherwise, complete review of systems is positive for none.  All other systems are reviewed and negative.   Physical Exam: VS:  BP (!) 150/90   Pulse 68   Ht 5\' 10"  (1.778 m)   Wt (!) 312 lb 6.4 oz (141.7 kg)   SpO2 98%   BMI 44.82 kg/m , BMI Body mass index is 44.82 kg/m.  Wt Readings from Last 3 Encounters:  09/18/21 (!) 312 lb 6.4 oz (141.7 kg)  09/03/21 (!) 309 lb 6.4 oz (140.3 kg)  06/11/21 (!) 318 lb (144.2 kg)    General: Morbidly obese patient appears comfortable at rest. Neck: Supple, no elevated JVP or carotid bruits, no thyromegaly. Lungs: Clear to auscultation, nonlabored breathing at rest. Cardiac: Regular rate and rhythm, no S3 or significant systolic murmur, no pericardial rub. Extremities: No pitting edema, distal pulses 2+. Skin: Warm and dry. Musculoskeletal: No kyphosis. Neuropsychiatric: Alert and oriented x3, affect grossly appropriate.  ECG:  An ECG dated 03/20/2021 was personally reviewed today and demonstrated:  Normal sinus rhythm rate of 67, voltage criteria for LVH.  Nonspecific ST and T wave abnormality.  Recent Labwork: 03/10/2021: Hemoglobin 12.7; Platelets 293; TSH 0.627 09/03/2021: ALT 15; AST 20; BUN 9; Creatinine, Ser 0.61; Potassium 4.6; Sodium 143     Component Value Date/Time   CHOL 202 (H) 09/03/2021 1140   TRIG 77 09/03/2021 1140   HDL 73 09/03/2021 1140   CHOLHDL 2.8 09/03/2021 1140   LDLCALC 115 (H) 09/03/2021 1140    Other Studies Reviewed Today:  14-day ZIO monitor 03/20/2021 Study Highlights  ZIO XT reviewed.  13 days 15 hours analyzed.  Predominant rhythm is sinus with prolonged PR interval at baseline.  Heart rate ranged from 43 bpm up to 132 bpm with average heart rate 69 bpm.  There were rare PACs including couplets and triplets representing less than 1% total beats.  There were also rare PVCs including couplets and triplets representing less  than 1% total beats.  Brief ventricular bigeminy and trigeminy also noted.  A single episode of NSVT lasting 5 beats was observed.  There were also 2 relatively brief runs of SVT, the longest of which lasted 13 beats with heart rate in the 140s on average.  One episode of apparent second-degree heart block noted with 2.8-second pause, not clearly symptomatic with no patient trigger at that time.    Echocariogram 05/04/2018 Study Conclusions   - Left ventricle: The cavity size was normal. There was mild    concentric hypertrophy. Systolic function was normal. The    estimated ejection fraction was in the range of 60% to 65%. Wall    motion was normal; there were  no regional wall motion    abnormalities.  - Aortic valve: Mildly calcified annulus. Trileaflet; mildly    thickened leaflets. Sclerosis without stenosis.  - Atrial septum: No defect or patent foramen ovale was identified.    Echo contrast study showed no right-to-left atrial level shunt.   Assessment and Plan:  1. PSVT (paroxysmal supraventricular tachycardia) (HCC)   2. Essential hypertension   3. Mixed hyperlipidemia   4. Suspected sleep apnea      1. PSVT (paroxysmal supraventricular tachycardia) (HCC)  She had a 14-day ZIO monitor.  Monitor and showed PACs and PVCs, 1 episode of NSVT 5 beats.  She had 2 episodes of SVT relatively brief with the longest lasting 13 beats.  She had one 2.8-second pause.  No sustained arrhythmias or frequent pauses.  Continues to have palpitations mostly at night waking her up from sleep.  Continue Toprol-XL 200 mg daily.  2. Essential hypertension Blood pressure elevated today at 150/90.  She states her blood pressures at home are usually in the 120s over 30s systolic.  Continue Toprol-XL 200 mg daily.  Continue Lasix 40 mg daily.  Continue supplemental potassium.  3. Mixed hyperlipidemia After last visit she had stopped her statin medication and this relieved her muscle aches and symptoms.   She states since she stopped this medication her PCP has placed her on lovastatin 40 mg daily.  She states she still has some symptoms but not as prominent as when she was on previous statin.  Continue lovastatin 40 mg daily.  5.  Suspected sleep apnea Patient states she has a history of snoring and sometimes wakes up from sleep with tachycardia/palpitations.  States she has some intermittent daytime sleepiness.  Please refer to pulmonary for sleep consultation for suspected sleep apnea   Medication Adjustments/Labs and Tests Ordered: Current medicines are reviewed at length with the patient today.  Concerns regarding medicines are outlined above.   Disposition: Follow-up with Dr. Wyline Mood or APP 6 months  Signed, Rennis Harding, NP 09/18/2021 8:48 AM    [facility] at [facility] [address] [place], [place], [place] [address] Phone: [phone]; Fax: [phone]

## 2021-09-18 ENCOUNTER — Encounter: Payer: Self-pay | Admitting: Family Medicine

## 2021-09-18 ENCOUNTER — Ambulatory Visit: Payer: Medicare HMO | Admitting: Family Medicine

## 2021-09-18 VITALS — BP 150/90 | HR 68 | Ht 70.0 in | Wt 312.4 lb

## 2021-09-18 DIAGNOSIS — E782 Mixed hyperlipidemia: Secondary | ICD-10-CM

## 2021-09-18 DIAGNOSIS — I10 Essential (primary) hypertension: Secondary | ICD-10-CM

## 2021-09-18 DIAGNOSIS — R29818 Other symptoms and signs involving the nervous system: Secondary | ICD-10-CM | POA: Diagnosis not present

## 2021-09-18 DIAGNOSIS — I471 Supraventricular tachycardia: Secondary | ICD-10-CM | POA: Diagnosis not present

## 2021-09-18 NOTE — Patient Instructions (Signed)
Medication Instructions:  Your physician recommends that you continue on your current medications as directed. Please refer to the Current Medication list given to you today.  Labwork: none  Testing/Procedures: none  Follow-Up: Your physician recommends that you schedule a follow-up appointment in: 6 months  Any Other Special Instructions Will Be Listed Below (If Applicable). You have been referred to [facility]  If you need a refill on your cardiac medications before your next appointment, please call your pharmacy. 

## 2021-10-16 ENCOUNTER — Encounter: Payer: Self-pay | Admitting: Nurse Practitioner

## 2021-10-16 ENCOUNTER — Ambulatory Visit (INDEPENDENT_AMBULATORY_CARE_PROVIDER_SITE_OTHER): Payer: Medicare HMO | Admitting: Nurse Practitioner

## 2021-10-16 VITALS — BP 131/88 | HR 73 | Temp 97.2°F | Resp 20 | Ht 70.0 in | Wt 313.0 lb

## 2021-10-16 DIAGNOSIS — J329 Chronic sinusitis, unspecified: Secondary | ICD-10-CM

## 2021-10-16 MED ORDER — AMOXICILLIN-POT CLAVULANATE 875-125 MG PO TABS: 1.0000 | ORAL_TABLET | Freq: Two times a day (BID) | ORAL | 0 refills | Status: DC

## 2021-10-16 NOTE — Progress Notes (Signed)
Acute Office Visit  Subjective:    Patient ID: Kristine Mata, female    DOB: Jan 24, 1956, 65 y.o.   MRN: [id number]  Chief Complaint  Patient presents with   Sinusitis    Sinusitis This is a new problem. Episode onset: in the past 3-5 days. The problem is unchanged. There has been no fever. The pain is moderate. Associated symptoms include ear pain, headaches, sinus pressure and swollen glands. Pertinent negatives include no chills or coughing. Past treatments include nothing.    Past Medical History:  Diagnosis Date   Ectopic pregnancy    Heart murmur    Hyperlipidemia    Hypertension    Insomnia, unspecified    Osteopenia 03/12/2021   Right knee pain    SVT (supraventricular tachycardia) (HCC)     Past Surgical History:  Procedure Laterality Date   ABDOMINAL HYSTERECTOMY     BREAST CYST EXCISION Left    65 years old   cyst left breast      SALPINGECTOMY Left    TMJ ARTHROPLASTY      Family History  Problem Relation Age of Onset   Heart disease Mother        used nitroglycerin   Hypertension Mother    Thyroid disease Mother    Pancreatic cancer Mother    Hypertension Father    Diabetes Sister    Hypertension Sister    Hypertension Sister    Colon cancer Sister    Stroke Maternal Grandmother    Hypertension Brother    Thyroid disease Brother    Diabetes Brother    Hypertension Brother    COPD Brother    Heart disease Brother    Stroke Brother    COPD Brother    Lung disease Brother    Breast cancer Neg Hx     Social History   Socioeconomic History   Marital status: Divorced    Spouse name: Not on file   Number of children: 1   Years of education: Not on file   Highest education level: Not on file  Occupational History   Occupation: Retired  Tobacco Use   Smoking status: Never   Smokeless tobacco: Never  Vaping Use   Vaping Use: Never used  Substance and Sexual Activity   Alcohol use: Never   Drug use: Never   Sexual activity: Not  Currently  Other Topics Concern   Not on file  Social History Narrative   Lives alone. Has one son - lives in [place].   Social Determinants of Health   Financial Resource Strain: Low Risk    Difficulty of Paying Living Expenses: Not very hard  Food Insecurity: No Food Insecurity   Worried About Charity fundraiser in the Last Year: Never true   Arboriculturist in the Last Year: Never true  Transportation Needs: No Transportation Needs   Lack of Transportation (Medical): No   Lack of Transportation (Non-Medical): No  Physical Activity: Insufficiently Active   Days of Exercise per Week: 5 days   Minutes of Exercise per Session: 20 min  Stress: Stress Concern Present   Feeling of Stress : To some extent  Social Connections: Moderately Isolated   Frequency of Communication with Friends and Family: More than three times a week   Frequency of Social Gatherings with Friends and Family: Twice a week   Attends Religious Services: More than 4 times per year   Active Member of Clubs or Organizations: No   Attends  Club or Organization Meetings: Never   Marital Status: Divorced  Human resources officer Violence: Not At Risk   Fear of Current or Ex-Partner: No   Emotionally Abused: No   Physically Abused: No   Sexually Abused: No    Outpatient Medications Prior to Visit  Medication Sig Dispense Refill   fluticasone (FLONASE) 50 MCG/ACT nasal spray Place 2 sprays into both nostrils daily. 16 g 6   furosemide (LASIX) 40 MG tablet Take 1 tablet (40 mg total) by mouth daily. 90 tablet 3   lovastatin (MEVACOR) 40 MG tablet Take 1 tablet (40 mg total) by mouth at bedtime. 30 tablet 2   metoprolol (TOPROL-XL) 200 MG 24 hr tablet Take 1 tablet (200 mg total) by mouth daily. 90 tablet 3   potassium chloride (KLOR-CON) 10 MEQ tablet Take 1 tablet (10 mEq total) by mouth daily. 90 tablet 3   traZODone (DESYREL) 50 MG tablet Take 0.5-1 tablets (25-50 mg total) by mouth at bedtime. 30 tablet 2   No  facility-administered medications prior to visit.    Allergies  Allergen Reactions   Anesthesia S-I-40 [Propofol] Nausea And Vomiting   Codeine Nausea And Vomiting   Naproxen Palpitations    Review of Systems  Constitutional: Negative.  Negative for chills and fever.  HENT:  Positive for ear discharge, ear pain and sinus pressure.   Eyes: Negative.   Respiratory: Negative.  Negative for cough.   Cardiovascular: Negative.   Gastrointestinal: Negative.   Skin: Negative.  Negative for rash.  Neurological:  Positive for headaches.  All other systems reviewed and are negative.     Objective:    Physical Exam Vitals and nursing note reviewed.  Constitutional:      Appearance: Normal appearance. She is obese.  HENT:     Head: Normocephalic.     Right Ear: External ear normal. No decreased hearing noted. Tenderness present. No drainage. No foreign body.     Left Ear: External ear normal. No decreased hearing noted. Drainage and tenderness present. No foreign body.     Nose: Nose normal.     Mouth/Throat:     Lips: Pink.     Mouth: Mucous membranes are moist.     Tongue: No lesions.     Pharynx: Posterior oropharyngeal erythema present. No oropharyngeal exudate.  Eyes:     Conjunctiva/sclera: Conjunctivae normal.  Cardiovascular:     Rate and Rhythm: Normal rate and regular rhythm.     Pulses: Normal pulses.     Heart sounds: Normal heart sounds.  Pulmonary:     Effort: Pulmonary effort is normal.     Breath sounds: Normal breath sounds.  Abdominal:     General: Bowel sounds are normal.  Skin:    General: Skin is warm.     Findings: No erythema or rash.  Neurological:     Mental Status: She is alert and oriented to person, place, and time.  Psychiatric:        Behavior: Behavior normal.    BP 131/88   Pulse 73   Temp (!) 97.2 F (36.2 C) (Temporal)   Resp 20   Ht '5\' 10"'  (1.778 m)   Wt (!) 313 lb (142 kg)   SpO2 95%   BMI 44.91 kg/m  Wt Readings from Last 3  Encounters:  10/16/21 (!) 313 lb (142 kg)  09/18/21 (!) 312 lb 6.4 oz (141.7 kg)  09/03/21 (!) 309 lb 6.4 oz (140.3 kg)    Health Maintenance Due  Topic Date Due   COVID-19 Vaccine (1) Never done   COLONOSCOPY (Pts 45-20yr Insurance coverage will need to be confirmed)  04/20/2020    There are no preventive care reminders to display for this patient.   Lab Results  Component Value Date   TSH 0.627 03/10/2021   Lab Results  Component Value Date   WBC 6.9 03/10/2021   HGB 12.7 03/10/2021   HCT 39.5 03/10/2021   MCV 90 03/10/2021   PLT 293 03/10/2021   Lab Results  Component Value Date   NA 143 09/03/2021   K 4.6 09/03/2021   CO2 28 09/03/2021   GLUCOSE 104 (H) 09/03/2021   BUN 9 09/03/2021   CREATININE 0.61 09/03/2021   BILITOT 0.3 09/03/2021   ALKPHOS 153 (H) 09/03/2021   AST 20 09/03/2021   ALT 15 09/03/2021   PROT 7.1 09/03/2021   ALBUMIN 4.7 09/03/2021   CALCIUM 10.9 (H) 09/03/2021   ANIONGAP 12 03/17/2018   EGFR 99 09/03/2021   Lab Results  Component Value Date   CHOL 202 (H) 09/03/2021   Lab Results  Component Value Date   HDL 73 09/03/2021   Lab Results  Component Value Date   LDLCALC 115 (H) 09/03/2021   Lab Results  Component Value Date   TRIG 77 09/03/2021   Lab Results  Component Value Date   CHOLHDL 2.8 09/03/2021   No results found for: HGBA1C     Assessment & Plan:   Problem List Items Addressed This Visit       Respiratory   Upper respiratory tract infection - Primary    Take meds as prescribed - Use a cool mist humidifier  -Use saline nose sprays frequently -Force fluids -For fever or aches or pains- take Tylenol or ibuprofen. -Augmentin 875-125 mg tablet by mouth  -If symptoms do not improve, she may need to be COVID tested to rule this out Follow up with worsening unresolved symptoms  RX sent to pharmacy      Relevant Medications   amoxicillin-clavulanate (AUGMENTIN) 875-125 MG tablet     Meds ordered this  encounter  Medications   amoxicillin-clavulanate (AUGMENTIN) 875-125 MG tablet    Sig: Take 1 tablet by mouth 2 (two) times daily.    Dispense:  20 tablet    Refill:  0    Order Specific Question:   Supervising Provider    Answer:   SClaretta Fraise[[[id number]    OIvy Lynn NP

## 2021-10-16 NOTE — Assessment & Plan Note (Signed)
Take meds as prescribed - Use a cool mist humidifier  -Use saline nose sprays frequently -Force fluids -For fever or aches or pains- take Tylenol or ibuprofen. -Augmentin 875-125 mg tablet by mouth  -If symptoms do not improve, she may need to be COVID tested to rule this out Follow up with worsening unresolved symptoms  RX sent to pharmacy

## 2021-10-16 NOTE — Patient Instructions (Signed)

## 2021-11-02 ENCOUNTER — Telehealth: Payer: Self-pay | Admitting: Family Medicine

## 2021-11-02 ENCOUNTER — Other Ambulatory Visit: Payer: Self-pay | Admitting: Family Medicine

## 2021-11-02 MED ORDER — FLUCONAZOLE 150 MG PO TABS: 150.0000 mg | ORAL_TABLET | Freq: Once | ORAL | 0 refills | Status: AC

## 2021-11-02 NOTE — Telephone Encounter (Signed)
Please let the patient know that I sent their prescription to their pharmacy. Thanks, WS 

## 2021-11-02 NOTE — Telephone Encounter (Signed)
Pt aware.

## 2021-11-09 DIAGNOSIS — K219 Gastro-esophageal reflux disease without esophagitis: Secondary | ICD-10-CM | POA: Diagnosis not present

## 2021-11-09 DIAGNOSIS — I499 Cardiac arrhythmia, unspecified: Secondary | ICD-10-CM | POA: Diagnosis not present

## 2021-11-09 DIAGNOSIS — J309 Allergic rhinitis, unspecified: Secondary | ICD-10-CM | POA: Diagnosis not present

## 2021-11-09 DIAGNOSIS — R609 Edema, unspecified: Secondary | ICD-10-CM | POA: Diagnosis not present

## 2021-11-09 DIAGNOSIS — I10 Essential (primary) hypertension: Secondary | ICD-10-CM | POA: Diagnosis not present

## 2021-11-09 DIAGNOSIS — E785 Hyperlipidemia, unspecified: Secondary | ICD-10-CM | POA: Diagnosis not present

## 2021-11-09 DIAGNOSIS — G4700 Insomnia, unspecified: Secondary | ICD-10-CM | POA: Diagnosis not present

## 2021-11-09 DIAGNOSIS — M1990 Unspecified osteoarthritis, unspecified site: Secondary | ICD-10-CM | POA: Diagnosis not present

## 2021-11-09 DIAGNOSIS — G8929 Other chronic pain: Secondary | ICD-10-CM | POA: Diagnosis not present

## 2021-11-18 NOTE — Progress Notes (Signed)
Referring Provider: Loman Brooklyn, [name] Primary Care Physician:  Loman Brooklyn, [name] Primary Gastroenterologist:  Dr. Gala Romney  Chief Complaint  Patient presents with   Consult    TCS done in [place] per pt. Had polyps    HPI:   Kristine Mata is a 66 y.o. female presenting today at the request of Loman Brooklyn, [name] for consult colonoscopy.  Recommended office visit due to BMI.  Colonoscopy in 2016 at [facility]: 2 polyps less than 1 cm in size removed, TI biopsied.  Pathology revealed tubular adenoma, TI biopsy with reactive lymphoid hyperplasia without active or chronic enteritis, hyperplastic polyp.  Today, patient reports she is doing well overall.  She has chronic history of mild constipation, but overall having soft, formed bowel movements daily.  Occasionally, she does take a stool softener, but this is only about twice a month.  Denies BRBPR, melena, unintentional weight loss, heartburn/reflux symptoms, nausea, vomiting, or dysphagia.   Family history significant for 2 sisters with colon cancer, one in her 71s and another at age 16.  She reports regarding anesthesia for colonoscopy, patient is requesting to have as little sedation as possible.  States she received very little sedation for her procedure at [facility] and was able to watch the screen during her colonoscopy.  This is what she prefers.  She has chronic history of intermittent heart palpitations without change.  She follows with cardiology for PSVT, symptoms have been stable, currently on metoprolol.  She denies chest pain, shortness of breath.  She does have plans to be seen for a sleep study in the near future to evaluate for sleep apnea.  Reports her cardiologist has referred her for this because she told him she does not always sleep through the night.  Denies snoring.   Past Medical History:  Diagnosis Date   Ectopic pregnancy    Heart murmur    Hyperlipidemia    Hypertension    Insomnia, unspecified     Osteopenia 03/12/2021   Right knee pain    SVT (supraventricular tachycardia) (HCC)     Past Surgical History:  Procedure Laterality Date   ABDOMINAL HYSTERECTOMY     BREAST CYST EXCISION Left    66 years old   COLONOSCOPY  2016   [facility]; 1 tubular adenoma, 1 hyperplastic polyp, TI biopsy with reactive lymphoid hyperplasia without active or chronic enteritis.   cyst left breast      SALPINGECTOMY Left    TMJ ARTHROPLASTY      Current Outpatient Medications  Medication Sig Dispense Refill   fluticasone (FLONASE) 50 MCG/ACT nasal spray Place 2 sprays into both nostrils daily. 16 g 6   furosemide (LASIX) 40 MG tablet Take 1 tablet (40 mg total) by mouth daily. 90 tablet 3   lovastatin (MEVACOR) 40 MG tablet Take 1 tablet (40 mg total) by mouth at bedtime. 30 tablet 2   metoprolol (TOPROL-XL) 200 MG 24 hr tablet Take 1 tablet (200 mg total) by mouth daily. 90 tablet 3   potassium chloride (KLOR-CON) 10 MEQ tablet Take 1 tablet (10 mEq total) by mouth daily. 90 tablet 3   No current facility-administered medications for this visit.    Allergies as of 11/19/2021 - Review Complete 11/19/2021  Allergen Reaction Noted   Anesthesia s-i-40 [propofol] Nausea And Vomiting 01/28/2021   Codeine Nausea And Vomiting    Naproxen Palpitations 01/28/2021    Family History  Problem Relation Age of Onset   Heart disease Mother  used nitroglycerin  ° Hypertension Mother   ° Thyroid disease Mother   ° Pancreatic cancer Mother   ° Hypertension Father   ° Diabetes Sister   ° Hypertension Sister   ° Colon cancer Sister   °     59  ° Hypertension Sister   ° Colon cancer Sister   °     60s  ° Hypertension Brother   ° Thyroid disease Brother   ° Diabetes Brother   ° Hypertension Brother   ° COPD Brother   ° Heart disease Brother   ° Stroke Brother   ° COPD Brother   ° Lung disease Brother   ° Stroke Maternal Grandmother   ° Breast cancer Neg Hx   ° ° °Social History  ° °Socioeconomic History  ° Marital  status: Divorced  °  Spouse name: Not on file  ° Number of children: 1  ° Years of education: Not on file  ° Highest education level: Not on file  °Occupational History  ° Occupation: Retired  °Tobacco Use  ° Smoking status: Never  ° Smokeless tobacco: Never  °Vaping Use  ° Vaping Use: Never used  °Substance and Sexual Activity  ° Alcohol use: Never  ° Drug use: Never  ° Sexual activity: Not Currently  °Other Topics Concern  ° Not on file  °Social History Narrative  ° Lives alone. Has one son - lives in [place].  ° °Social Determinants of Health  ° °Financial Resource Strain: Low Risk   ° Difficulty of Paying Living Expenses: Not very hard  °Food Insecurity: No Food Insecurity  ° Worried About Running Out of Food in the Last Year: Never true  ° Ran Out of Food in the Last Year: Never true  °Transportation Needs: No Transportation Needs  ° Lack of Transportation (Medical): No  ° Lack of Transportation (Non-Medical): No  °Physical Activity: Insufficiently Active  ° Days of Exercise per Week: 5 days  ° Minutes of Exercise per Session: 20 min  °Stress: Stress Concern Present  ° Feeling of Stress : To some extent  °Social Connections: Moderately Isolated  ° Frequency of Communication with Friends and Family: More than three times a week  ° Frequency of Social Gatherings with Friends and Family: Twice a week  ° Attends Religious Services: More than 4 times per year  ° Active Member of Clubs or Organizations: No  ° Attends Club or Organization Meetings: Never  ° Marital Status: Divorced  °Intimate Partner Violence: Not At Risk  ° Fear of Current or Ex-Partner: No  ° Emotionally Abused: No  ° Physically Abused: No  ° Sexually Abused: No  ° ° °Review of Systems: °Gen: Denies any fever, chills, cold or flulike symptoms, presyncope, syncope. °CV: Denies chest pain.  Admits to occasional heart palpitations, chronic. °Resp: Denies shortness of breath or cough. °GI: See HPI °GU : Denies urinary burning, urinary frequency, urinary  hesitancy °MS: Denies joint pain. °Derm: Denies rash. °Psych: Denies depression, anxiety. °Heme: See HPI ° °Physical Exam: °BP (!) 148/81    Pulse 64    Temp (!) 97.1 °F (36.2 °C)    Ht 5' 10" (1.778 m)    Wt (!) 315 lb (142.9 kg)    BMI 45.20 kg/m²  °General:   Alert and oriented. Pleasant and cooperative. Well-nourished and well-developed.  °Head:  Normocephalic and atraumatic. °Eyes:  Without icterus, sclera clear and conjunctiva pink.  °Ears:  Normal auditory acuity. °Lungs:  Clear to auscultation bilaterally. No   wheezes, rales, or rhonchi. No distress.  Heart:  S1, S2 present without murmurs appreciated.  Abdomen:  +BS, soft, non-tender and non-distended. No HSM noted. No guarding or rebound. No masses appreciated.  Rectal:  Deferred  Msk:  Symmetrical without gross deformities. Normal posture. Extremities:  With edema in bilateral lower extremities, left greater than right.  Per patient, this is chronic and without change since being stung by a jellyfish years ago. Neurologic:  Alert and  oriented x4;  grossly normal neurologically. Skin:  Intact without significant lesions or rashes. Psych:  Normal mood and affect.    Assessment: 66 year old female with history of HTN, HLD, PSVT, adenomatous colon polyp, family history of colon cancer in 2 sisters (age 15 and 35s), presenting today to discuss scheduling surveillance colonoscopy.  Her last colonoscopy was in 2016 at [facility] revealing 1 tubular adenoma, 1 hyperplastic polyp, TI biopsy with reactive lymphoid hyperplasia without active or chronic enteritis.  She has no significant upper or lower GI symptoms.  No alarm symptoms.  Regarding anesthesia, patient is requesting minimal sedation and would like to be able to watch the screen as her colonoscopy is being performed.  States her colonoscopy was done this way at [facility] and she tolerated her procedure well.   Plan: Proceed with colonoscopy with conscious sedation with Dr. Gala Romney in the near future.  The risks, benefits, and alternatives have been discussed with the patient in detail. The patient states understanding and desires to proceed. ASA 3 *Patient is requesting minimal sedation and would like to be awake enough to watch the screen as her colonoscopy has been performed. Follow-up as needed.   Aliene Altes, PA-C [facility] 11/19/2021

## 2021-11-18 NOTE — H&P (View-Only) (Signed)
Referring Provider: Loman Brooklyn, [name] Primary Care Physician:  Loman Brooklyn, [name] Primary Gastroenterologist:  Dr. Gala Romney  Chief Complaint  Patient presents with   Consult    TCS done in [place] per pt. Had polyps    HPI:   Kristine Mata is a 66 y.o. female presenting today at the request of Loman Brooklyn, [name] for consult colonoscopy.  Recommended office visit due to BMI.  Colonoscopy in 2016 at [facility]: 2 polyps less than 1 cm in size removed, TI biopsied.  Pathology revealed tubular adenoma, TI biopsy with reactive lymphoid hyperplasia without active or chronic enteritis, hyperplastic polyp.  Today, patient reports she is doing well overall.  She has chronic history of mild constipation, but overall having soft, formed bowel movements daily.  Occasionally, she does take a stool softener, but this is only about twice a month.  Denies BRBPR, melena, unintentional weight loss, heartburn/reflux symptoms, nausea, vomiting, or dysphagia.   Family history significant for 2 sisters with colon cancer, one in her 66s and another at age 58.  She reports regarding anesthesia for colonoscopy, patient is requesting to have as little sedation as possible.  States she received very little sedation for her procedure at [facility] and was able to watch the screen during her colonoscopy.  This is what she prefers.  She has chronic history of intermittent heart palpitations without change.  She follows with cardiology for PSVT, symptoms have been stable, currently on metoprolol.  She denies chest pain, shortness of breath.  She does have plans to be seen for a sleep study in the near future to evaluate for sleep apnea.  Reports her cardiologist has referred her for this because she told him she does not always sleep through the night.  Denies snoring.   Past Medical History:  Diagnosis Date   Ectopic pregnancy    Heart murmur    Hyperlipidemia    Hypertension    Insomnia, unspecified     Osteopenia 03/12/2021   Right knee pain    SVT (supraventricular tachycardia) (HCC)     Past Surgical History:  Procedure Laterality Date   ABDOMINAL HYSTERECTOMY     BREAST CYST EXCISION Left    66 years old   COLONOSCOPY  2016   [facility]; 1 tubular adenoma, 1 hyperplastic polyp, TI biopsy with reactive lymphoid hyperplasia without active or chronic enteritis.   cyst left breast      SALPINGECTOMY Left    TMJ ARTHROPLASTY      Current Outpatient Medications  Medication Sig Dispense Refill   fluticasone (FLONASE) 50 MCG/ACT nasal spray Place 2 sprays into both nostrils daily. 16 g 6   furosemide (LASIX) 40 MG tablet Take 1 tablet (40 mg total) by mouth daily. 90 tablet 3   lovastatin (MEVACOR) 40 MG tablet Take 1 tablet (40 mg total) by mouth at bedtime. 30 tablet 2   metoprolol (TOPROL-XL) 200 MG 24 hr tablet Take 1 tablet (200 mg total) by mouth daily. 90 tablet 3   potassium chloride (KLOR-CON) 10 MEQ tablet Take 1 tablet (10 mEq total) by mouth daily. 90 tablet 3   No current facility-administered medications for this visit.    Allergies as of 11/19/2021 - Review Complete 11/19/2021  Allergen Reaction Noted   Anesthesia s-i-40 [propofol] Nausea And Vomiting 01/28/2021   Codeine Nausea And Vomiting    Naproxen Palpitations 01/28/2021    Family History  Problem Relation Age of Onset   Heart disease Mother  used nitroglycerin   Hypertension Mother    Thyroid disease Mother    Pancreatic cancer Mother    Hypertension Father    Diabetes Sister    Hypertension Sister    Colon cancer Sister        41   Hypertension Sister    Colon cancer Sister        63s   Hypertension Brother    Thyroid disease Brother    Diabetes Brother    Hypertension Brother    COPD Brother    Heart disease Brother    Stroke Brother    COPD Brother    Lung disease Brother    Stroke Maternal Grandmother    Breast cancer Neg Hx     Social History   Socioeconomic History   Marital  status: Divorced    Spouse name: Not on file   Number of children: 1   Years of education: Not on file   Highest education level: Not on file  Occupational History   Occupation: Retired  Tobacco Use   Smoking status: Never   Smokeless tobacco: Never  Vaping Use   Vaping Use: Never used  Substance and Sexual Activity   Alcohol use: Never   Drug use: Never   Sexual activity: Not Currently  Other Topics Concern   Not on file  Social History Narrative   Lives alone. Has one son - lives in [place].   Social Determinants of Health   Financial Resource Strain: Low Risk    Difficulty of Paying Living Expenses: Not very hard  Food Insecurity: No Food Insecurity   Worried About Charity fundraiser in the Last Year: Never true   Arboriculturist in the Last Year: Never true  Transportation Needs: No Transportation Needs   Lack of Transportation (Medical): No   Lack of Transportation (Non-Medical): No  Physical Activity: Insufficiently Active   Days of Exercise per Week: 5 days   Minutes of Exercise per Session: 20 min  Stress: Stress Concern Present   Feeling of Stress : To some extent  Social Connections: Moderately Isolated   Frequency of Communication with Friends and Family: More than three times a week   Frequency of Social Gatherings with Friends and Family: Twice a week   Attends Religious Services: More than 4 times per year   Active Member of [employer] or Organizations: No   Attends Music therapist: Never   Marital Status: Divorced  Human resources officer Violence: Not At Risk   Fear of Current or Ex-Partner: No   Emotionally Abused: No   Physically Abused: No   Sexually Abused: No    Review of Systems: Gen: Denies any fever, chills, cold or flulike symptoms, presyncope, syncope. CV: Denies chest pain.  Admits to occasional heart palpitations, chronic. Resp: Denies shortness of breath or cough. GI: See HPI GU : Denies urinary burning, urinary frequency, urinary  hesitancy MS: Denies joint pain. Derm: Denies rash. Psych: Denies depression, anxiety. Heme: See HPI  Physical Exam: BP (!) 148/81    Pulse 64    Temp (!) 97.1 F (36.2 C)    Ht 5\' 10"  (1.778 m)    Wt (!) 315 lb (142.9 kg)    BMI 45.20 kg/m  General:   Alert and oriented. Pleasant and cooperative. Well-nourished and well-developed.  Head:  Normocephalic and atraumatic. Eyes:  Without icterus, sclera clear and conjunctiva pink.  Ears:  Normal auditory acuity. Lungs:  Clear to auscultation bilaterally. No  wheezes, rales, or rhonchi. No distress.  Heart:  S1, S2 present without murmurs appreciated.  Abdomen:  +BS, soft, non-tender and non-distended. No HSM noted. No guarding or rebound. No masses appreciated.  Rectal:  Deferred  Msk:  Symmetrical without gross deformities. Normal posture. Extremities:  With edema in bilateral lower extremities, left greater than right.  Per patient, this is chronic and without change since being stung by a jellyfish years ago. Neurologic:  Alert and  oriented x4;  grossly normal neurologically. Skin:  Intact without significant lesions or rashes. Psych:  Normal mood and affect.    Assessment: 66 year old female with history of HTN, HLD, PSVT, adenomatous colon polyp, family history of colon cancer in 2 sisters (age 56 and 95s), presenting today to discuss scheduling surveillance colonoscopy.  Her last colonoscopy was in 2016 at [facility] revealing 1 tubular adenoma, 1 hyperplastic polyp, TI biopsy with reactive lymphoid hyperplasia without active or chronic enteritis.  She has no significant upper or lower GI symptoms.  No alarm symptoms.  Regarding anesthesia, patient is requesting minimal sedation and would like to be able to watch the screen as her colonoscopy is being performed.  States her colonoscopy was done this way at [facility] and she tolerated her procedure well.   Plan: Proceed with colonoscopy with conscious sedation with Dr. Gala Romney in the near future.  The risks, benefits, and alternatives have been discussed with the patient in detail. The patient states understanding and desires to proceed. ASA 3 *Patient is requesting minimal sedation and would like to be awake enough to watch the screen as her colonoscopy has been performed. Follow-up as needed.   Aliene Altes, PA-C [facility] 11/19/2021

## 2021-11-19 ENCOUNTER — Encounter: Payer: Self-pay | Admitting: Gastroenterology

## 2021-11-19 ENCOUNTER — Ambulatory Visit: Payer: Medicare HMO | Admitting: Gastroenterology

## 2021-11-19 ENCOUNTER — Other Ambulatory Visit: Payer: Self-pay

## 2021-11-19 VITALS — BP 148/81 | HR 64 | Temp 97.1°F | Ht 70.0 in | Wt 315.0 lb

## 2021-11-19 DIAGNOSIS — Z800 Family history of malignant neoplasm of digestive organs: Secondary | ICD-10-CM | POA: Diagnosis not present

## 2021-11-19 DIAGNOSIS — Z86010 Personal history of colonic polyps: Secondary | ICD-10-CM | POA: Diagnosis not present

## 2021-11-19 NOTE — Patient Instructions (Addendum)
We will arrange for you to have a colonoscopy with Dr. Jena Gauss in the near future.   We will see you back as needed. Do not hesitate to call if you have any new GI questions or concerns.    It was good to see you today!   Ermalinda Memos, PA-C [facility]astroenter[facility]

## 2021-11-20 ENCOUNTER — Institutional Professional Consult (permissible substitution): Payer: Medicare HMO | Admitting: Pulmonary Disease

## 2021-12-02 ENCOUNTER — Ambulatory Visit (HOSPITAL_COMMUNITY)
Admission: RE | Admit: 2021-12-02 | Discharge: 2021-12-02 | Disposition: A | Discharge status: Home / Self Care | Payer: Medicare HMO | Attending: Internal Medicine | Admitting: Internal Medicine

## 2021-12-02 ENCOUNTER — Other Ambulatory Visit: Payer: Self-pay

## 2021-12-02 ENCOUNTER — Encounter (HOSPITAL_COMMUNITY)
Admission: RE | Disposition: A | Discharge status: Home / Self Care | Payer: Self-pay | Source: Home / Self Care | Attending: Internal Medicine

## 2021-12-02 ENCOUNTER — Telehealth: Payer: Self-pay | Admitting: Internal Medicine

## 2021-12-02 ENCOUNTER — Encounter (HOSPITAL_COMMUNITY): Payer: Self-pay | Admitting: Internal Medicine

## 2021-12-02 DIAGNOSIS — K5730 Diverticulosis of large intestine without perforation or abscess without bleeding: Secondary | ICD-10-CM | POA: Insufficient documentation

## 2021-12-02 DIAGNOSIS — I10 Essential (primary) hypertension: Secondary | ICD-10-CM | POA: Insufficient documentation

## 2021-12-02 DIAGNOSIS — E785 Hyperlipidemia, unspecified: Secondary | ICD-10-CM | POA: Insufficient documentation

## 2021-12-02 DIAGNOSIS — Z800 Family history of malignant neoplasm of digestive organs: Secondary | ICD-10-CM | POA: Insufficient documentation

## 2021-12-02 DIAGNOSIS — Z79899 Other long term (current) drug therapy: Secondary | ICD-10-CM | POA: Diagnosis not present

## 2021-12-02 DIAGNOSIS — Z86010 Personal history of colonic polyps: Secondary | ICD-10-CM | POA: Insufficient documentation

## 2021-12-02 DIAGNOSIS — Z1211 Encounter for screening for malignant neoplasm of colon: Secondary | ICD-10-CM | POA: Diagnosis not present

## 2021-12-02 HISTORY — PX: COLONOSCOPY: SHX5424

## 2021-12-02 SURGERY — COLONOSCOPY
Anesthesia: Moderate Sedation

## 2021-12-02 MED ORDER — MEPERIDINE HCL 50 MG/ML IJ SOLN
INTRAMUSCULAR | Status: AC
  Filled 2021-12-02: qty 1

## 2021-12-02 MED ORDER — SODIUM CHLORIDE 0.9 % IV SOLN: INTRAVENOUS | Status: DC

## 2021-12-02 MED ORDER — MEPERIDINE HCL 100 MG/ML IJ SOLN
INTRAMUSCULAR | Status: DC | PRN
  Administered 2021-12-02: 25 mg via INTRAVENOUS

## 2021-12-02 MED ORDER — ONDANSETRON HCL 4 MG/2ML IJ SOLN
INTRAMUSCULAR | Status: DC | PRN
  Administered 2021-12-02: 4 mg via INTRAVENOUS

## 2021-12-02 MED ORDER — MIDAZOLAM HCL 5 MG/5ML IJ SOLN
INTRAMUSCULAR | Status: AC
  Filled 2021-12-02: qty 10

## 2021-12-02 MED ORDER — ONDANSETRON HCL 4 MG/2ML IJ SOLN
INTRAMUSCULAR | Status: AC
  Filled 2021-12-02: qty 2

## 2021-12-02 MED ORDER — MIDAZOLAM HCL 5 MG/5ML IJ SOLN
INTRAMUSCULAR | Status: DC | PRN
  Administered 2021-12-02: 1 mg via INTRAVENOUS
  Administered 2021-12-02: 2 mg via INTRAVENOUS

## 2021-12-02 NOTE — Telephone Encounter (Signed)
Communication noted.  

## 2021-12-02 NOTE — Op Note (Signed)
[facility] Patient Name: Kristine NiemannJanice Larrivee Procedure Date: 12/02/2021 6:55 AM MRN: [id number] Date of Birth: [birth year] Attending MD: Gennette Pacobert Michael Skylynne Schlechter , MD CSN: [id number] Age: 66 Admit Type: Outpatient Procedure:                Colonoscopy Indications:              High risk colon cancer surveillance: Personal                            history of colonic polyps Providers:                Gennette Pacobert Michael Westyn Driggers, MD, Angelica RanSusan Goss, Pandora LeiterNeville                            David, Technician Referring MD:              Medicines:                Midazolam 3 mg IV, Meperidine 25 mg IV Complications:            No immediate complications. Estimated Blood Loss:     Estimated blood loss: none. Procedure:                Pre-Anesthesia Assessment:                           - Prior to the procedure, a History and Physical                            was performed, and patient medications and                            allergies were reviewed. The patient's tolerance of                            previous anesthesia was also reviewed. The risks                            and benefits of the procedure and the sedation                            options and risks were discussed with the patient.                            All questions were answered, and informed consent                            was obtained. Prior Anticoagulants: The patient has                            taken no previous anticoagulant or antiplatelet                            agents. ASA Grade Assessment: III - A patient with  severe systemic disease. After reviewing the risks                            and benefits, the patient was deemed in                            satisfactory condition to undergo the procedure.                           After obtaining informed consent, the colonoscope                            was passed under direct vision. Throughout the                            procedure,  the patient's blood pressure, pulse, and                            oxygen saturations were monitored continuously. The                            [id number]) scope was introduced through the                            anus and advanced to the the cecum, identified by                            appendiceal orifice and ileocecal valve. The                            colonoscopy was performed without difficulty. The                            patient tolerated the procedure well. The quality                            of the bowel preparation was adequate. Scope In: 7:52:52 AM Scope Out: 8:03:42 AM Scope Withdrawal Time: 0 hours 7 minutes 31 seconds  Total Procedure Duration: 0 hours 10 minutes 50 seconds  Findings:      The perianal and digital rectal examinations were normal. Minimally       redundant colon. External abdominal pressure required to reach the cecum.      Scattered medium-mouthed diverticula were found in the sigmoid colon and       descending colon.      The exam was otherwise without abnormality on direct and retroflexion       views. Impression:               - Diverticulosis in the sigmoid colon and in the                            descending colon. Redundant colon.                           - The examination was otherwise  normal on direct                            and retroflexion views.                           - No specimens collected. Moderate Sedation:      Moderate (conscious) sedation was administered by the endoscopy nurse       and supervised by the endoscopist. The following parameters were       monitored: oxygen saturation, heart rate, blood pressure, respiratory       rate, EKG, adequacy of pulmonary ventilation, and response to care.       Total physician intraservice time was 13 minutes. Recommendation:           - Patient has a contact number available for                            emergencies. The signs and symptoms of potential                             delayed complications were discussed with the                            patient. Return to normal activities tomorrow.                            Written discharge instructions were provided to the                            patient.                           - Advance diet as tolerated.                           - Patient has a contact number available for                            emergencies. The signs and symptoms of potential                            delayed complications were discussed with the                            patient. Return to normal activities tomorrow.                            Written discharge instructions were provided to the                            patient.                           - Repeat colonoscopy in 5 years for surveillance.                           -  Return to GI clinic (date not yet determined). Procedure Code(s):        --- Professional ---                           [phone], Colonoscopy, flexible; diagnostic, including                            collection of specimen(s) by brushing or washing,                            when performed (separate procedure)                           G0500, Moderate sedation services provided by the                            same physician or other qualified health care                            professional performing a gastrointestinal                            endoscopic service that sedation supports,                            requiring the presence of an independent trained                            observer to assist in the monitoring of the                            patient's level of consciousness and physiological                            status; initial 15 minutes of intra-service time;                            patient age 63 years or older (additional time may                            be reported with 37169, as appropriate) Diagnosis Code(s):        --- Professional ---                            Z86.010, Personal history of colonic polyps                           K57.30, Diverticulosis of large intestine without                            perforation or abscess without bleeding CPT copyright 2019 American Medical Association. All rights reserved. The codes documented in this report are preliminary and upon coder review may  be revised to meet current compliance requirements. Gerrit Friends. Jena Gauss, MD Gennette Pac, MD 12/02/2021 8:17:08  AM This report has been signed electronically. Number of Addenda: 0

## 2021-12-02 NOTE — Telephone Encounter (Signed)
Patient called and asked if we gave oxygen during a procedure, she would not give me her name and said that she was going to look into this because she thinks she was given something she was not supposed to have.  I told her the nurse said that her oxygen levels were monitored during the procedure [phone]

## 2021-12-02 NOTE — Interval H&P Note (Signed)
History and Physical Interval Note:  12/02/2021 7:33 AM  Kristine Mata  has presented today for surgery, with the diagnosis of history of adenomatous colon polyps.  The various methods of treatment have been discussed with the patient and family. After consideration of risks, benefits and other options for treatment, the patient has consented to  Procedure(s) with comments: COLONOSCOPY (N/A) - 7:30am as a surgical intervention.  The patient's history has been reviewed, patient examined, no change in status, stable for surgery.  I have reviewed the patient's chart and labs.  Questions were answered to the patient's satisfaction.     Kristine Mata  No change.  Patient here for surveillance colonoscopy.  Discussed conscious sedation with her along with the general risk benefits limitations alternatives and imponderables.  Questions answered she is agreeable.

## 2021-12-02 NOTE — Discharge Instructions (Addendum)
Colonoscopy Discharge Instructions  Read the instructions outlined below and refer to this sheet in the next few weeks. These discharge instructions provide you with general information on caring for yourself after you leave the hospital. Your doctor may also give you specific instructions. While your treatment has been planned according to the most current medical practices available, unavoidable complications occasionally occur. If you have any problems or questions after discharge, call Dr. Jena Gauss at [phone]. ACTIVITY You may resume your regular activity, but move at a slower pace for the next 24 hours.  Take frequent rest periods for the next 24 hours.  Walking will help get rid of the air and reduce the bloated feeling in your belly (abdomen).  No driving for 24 hours (because of the medicine (anesthesia) used during the test).   Do not sign any important legal documents or operate any machinery for 24 hours (because of the anesthesia used during the test).  NUTRITION Drink plenty of fluids.  You may resume your normal diet as instructed by your doctor.  Begin with a light meal and progress to your normal diet. Heavy or fried foods are harder to digest and may make you feel sick to your stomach (nauseated).  Avoid alcoholic beverages for 24 hours or as instructed.  MEDICATIONS You may resume your normal medications unless your doctor tells you otherwise.  WHAT YOU CAN EXPECT TODAY Some feelings of bloating in the abdomen.  Passage of more gas than usual.  Spotting of blood in your stool or on the toilet paper.  IF YOU HAD POLYPS REMOVED DURING THE COLONOSCOPY: No aspirin products for 7 days or as instructed.  No alcohol for 7 days or as instructed.  Eat a soft diet for the next 24 hours.  FINDING OUT THE RESULTS OF YOUR TEST Not all test results are available during your visit. If your test results are not back during the visit, make an appointment with your caregiver to find out the  results. Do not assume everything is normal if you have not heard from your caregiver or the medical facility. It is important for you to follow up on all of your test results.  SEEK IMMEDIATE MEDICAL ATTENTION IF: You have more than a spotting of blood in your stool.  Your belly is swollen (abdominal distention).  You are nauseated or vomiting.  You have a temperature over 101.  You have abdominal pain or discomfort that is severe or gets worse throughout the day.    It was nice meeting you today!  No polyps found today.  Diverticulosis information provided  It is recommended you return for repeat colonoscopy in 5 years  At patient request, I called Henreitta Cea at [phone] and reviewed results.  PATIENT INSTRUCTIONS POST-ANESTHESIA  IMMEDIATELY FOLLOWING SURGERY:  Do not drive or operate machinery for the first twenty four hours after surgery.  Do not make any important decisions for twenty four hours after surgery or while taking narcotic pain medications or sedatives.  If you develop intractable nausea and vomiting or a severe headache please notify your doctor immediately.  FOLLOW-UP:  Please make an appointment with your surgeon as instructed. You do not need to follow up with anesthesia unless specifically instructed to do so.  WOUND CARE INSTRUCTIONS (if applicable):  Keep a dry clean dressing on the anesthesia/puncture wound site if there is drainage.  Once the wound has quit draining you may leave it open to air.  Generally you should leave the bandage intact  for twenty four hours unless there is drainage.  If the epidural site drains for more than 36-48 hours please call the anesthesia department.  QUESTIONS?:  Please feel free to call your physician or the hospital operator if you have any questions, and they will be happy to assist you.

## 2021-12-02 NOTE — Telephone Encounter (Signed)
Pt called wanting to know why she was given oxygen during her procedure. Pt states that she has never been given oxygen during her colonoscopy. Pt states that she has had a terrible headache since having her procedure today. Pt also states that she was given a "cocktail" and is wanting to know if maybe that could be causing it as well. Pt feels like she was given something that she may not have needed.

## 2021-12-04 ENCOUNTER — Encounter (HOSPITAL_COMMUNITY): Payer: Self-pay | Admitting: Internal Medicine

## 2021-12-09 ENCOUNTER — Other Ambulatory Visit: Payer: Self-pay | Admitting: Nurse Practitioner

## 2021-12-09 ENCOUNTER — Other Ambulatory Visit: Payer: Self-pay | Admitting: Family Medicine

## 2021-12-09 DIAGNOSIS — E782 Mixed hyperlipidemia: Secondary | ICD-10-CM

## 2021-12-14 ENCOUNTER — Other Ambulatory Visit: Payer: Medicare HMO | Admitting: Obstetrics & Gynecology

## 2021-12-20 ENCOUNTER — Other Ambulatory Visit: Payer: Self-pay | Admitting: Family Medicine

## 2021-12-20 DIAGNOSIS — E782 Mixed hyperlipidemia: Secondary | ICD-10-CM

## 2021-12-25 ENCOUNTER — Ambulatory Visit (INDEPENDENT_AMBULATORY_CARE_PROVIDER_SITE_OTHER): Payer: Medicare HMO | Admitting: Obstetrics & Gynecology

## 2021-12-25 ENCOUNTER — Other Ambulatory Visit: Payer: Self-pay

## 2021-12-25 ENCOUNTER — Encounter: Payer: Self-pay | Admitting: Obstetrics & Gynecology

## 2021-12-25 VITALS — BP 135/78 | HR 66 | Ht 70.0 in | Wt 317.0 lb

## 2021-12-25 DIAGNOSIS — Z6841 Body Mass Index (BMI) 40.0 and over, adult: Secondary | ICD-10-CM

## 2021-12-25 DIAGNOSIS — Z90710 Acquired absence of both cervix and uterus: Secondary | ICD-10-CM | POA: Diagnosis not present

## 2021-12-25 DIAGNOSIS — R102 Pelvic and perineal pain: Secondary | ICD-10-CM | POA: Diagnosis not present

## 2021-12-25 NOTE — Progress Notes (Signed)
WELL-WOMAN EXAMINATION Patient name: Kristine Mata MRN [id number]  Date of birth: Dec 10, 1955 Chief Complaint:   New Patient (Initial Visit), Gynecologic Exam, and Contraception  History of Present Illness:   Kristine Mata is a 66 y.o. [id number] PM, PH female being seen today for a routine gyn exam  On occasional will have some left sided pelvic pain- maybe once a week or so, but not all the time. Does not require medication.  Seems to be related to what she is eating.  Denies nausea/vomiting.  No other acute complaints Of note, states she recently had a colonoscopy and   2005- hysterectomy- due to AUB/uterine fibroids- laparoscopic She is not sure if she does or does not have a cervix.   No LMP recorded. Patient has had a hysterectomy.  Last pap not sure.  Last mammogram: 07/2021. Last colonoscopy: Jan 2023  Depression screen [facility] 2/9 12/25/2021 10/16/2021 09/03/2021 06/11/2021 10/27/2020  Decreased Interest 1 0 0 0 0  Down, Depressed, Hopeless 1 1 1 1  0  PHQ - 2 Score 2 1 1 1  0  Altered sleeping 1 1 1  - -  Tired, decreased energy 0 0 0 - -  Change in appetite 1 1 0 - -  Feeling bad or failure about yourself  0 0 0 - -  Trouble concentrating 0 0 0 - -  Moving slowly or fidgety/restless 0 0 0 - -  Suicidal thoughts 0 0 0 - -  PHQ-9 Score 4 3 2  - -  Difficult doing work/chores - Somewhat difficult Not difficult at all - -      Review of Systems:   Pertinent items are noted in HPI Denies any headaches, blurred vision, fatigue, shortness of breath, chest pain, abdominal pain, bowel movements, urination, or intercourse unless otherwise stated above.  Pertinent History Reviewed:  Reviewed past medical,surgical, social and family history.  Reviewed problem list, medications and allergies. Physical Assessment:   Vitals:   12/25/21 0923  BP: 135/78  Pulse: 66  Weight: (!) 317 lb (143.8 kg)  Height: 5\' 10"  (1.778 m)  Body mass index is 45.48 kg/m.        Physical  Examination:   General appearance - well appearing, and in no distress  Mental status - alert, oriented to person, place, and time  Psych:  She has a normal mood and affect  Skin - warm and dry, normal color, no suspicious lesions noted  Chest - effort normal, all lung fields clear to auscultation bilaterally  Heart - normal rate and regular rhythm  Neck:  midline trachea, no thyromegaly or nodules  Breasts - breasts appear normal, no suspicious masses, no skin or nipple changes or  axillary nodes  Abdomen - obese, soft, nontender, nondistended, no masses or organomegaly  Pelvic - VULVA: normal appearing vulva with no masses, tenderness or lesions  VAGINA: normal appearing vagina with normal color and discharge, no lesions, uterus and cervix surgically absent  ADNEXA: No adnexal masses or tenderness noted- exam limited due to body habitus    Extremities:  2+ edema left >right  Chaperone: & Plan:  1) Pelvic pain -no acute abnormalities noted on exam -based on history suspect pain is GI in nature  -Should pain worsen or change, may consider pelvic to r/o underlying ovarian etiology  2) Cervical cancer screening Reviewed ASCCP guidelines, pap not indicated as pt does not have a cervix and hysterectomy was completed for benign condition -  pt encouraged to continue with other preventive screening through PCP  -morbid obesity -pedal edema   Meds: No orders of the defined types were placed in this encounter.   Follow-up: Return in about 1 year (around 12/25/2022) for Annual.   Myna Hidalgo, DO Attending Obstetrician & Gynecologist, [facility], [facility]

## 2022-01-08 ENCOUNTER — Ambulatory Visit (INDEPENDENT_AMBULATORY_CARE_PROVIDER_SITE_OTHER): Payer: Medicare HMO | Admitting: Family

## 2022-01-08 ENCOUNTER — Encounter: Payer: Self-pay | Admitting: Family

## 2022-01-08 VITALS — BP 131/71 | HR 68 | Temp 97.5°F | Ht 69.5 in

## 2022-01-08 DIAGNOSIS — H6983 Other specified disorders of Eustachian tube, bilateral: Secondary | ICD-10-CM | POA: Diagnosis not present

## 2022-01-08 DIAGNOSIS — J069 Acute upper respiratory infection, unspecified: Secondary | ICD-10-CM | POA: Diagnosis not present

## 2022-01-08 MED ORDER — CETIRIZINE HCL 10 MG PO TABS: 10.0000 mg | ORAL_TABLET | Freq: Every day | ORAL | 11 refills | Status: DC

## 2022-01-08 NOTE — Progress Notes (Signed)
Subjective:    Patient ID: Kristine Mata, female    DOB: 1956/05/13, 66 y.o.   MRN: [id number]  Chief Complaint  Patient presents with   Ear Pain    Going down to neck 3-4 days    Dizziness   Headache   PT presents to the office today with bilateral ear pain and headache that started 3-4 days ago.   Dizziness Associated symptoms include headaches. Pertinent negatives include no coughing or sore throat.  Headache  Associated symptoms include dizziness, ear pain, hearing loss and rhinorrhea. Pertinent negatives include no coughing or sore throat.  Otalgia  There is pain in both ears. The current episode started in the past 7 days. The problem occurs every few minutes. The problem has been waxing and waning. There has been no fever. The pain is at a severity of 8/10. The pain is moderate. Associated symptoms include headaches, hearing loss and rhinorrhea. Pertinent negatives include no coughing, ear discharge or sore throat. She has tried acetaminophen (flonase) for the symptoms. The treatment provided mild relief.     Review of Systems  HENT:  Positive for ear pain, hearing loss and rhinorrhea. Negative for ear discharge and sore throat.   Respiratory:  Negative for cough.   Neurological:  Positive for dizziness and headaches.  All other systems reviewed and are negative.     Objective:   Physical Exam Vitals reviewed.  Constitutional:      General: She is not in acute distress.    Appearance: She is well-developed. She is obese.  HENT:     Head: Normocephalic and atraumatic.     Right Ear: External ear normal. A middle ear effusion is present. Tympanic membrane is not erythematous.     Left Ear: A middle ear effusion is present. Tympanic membrane is not erythematous.  Eyes:     Pupils: Pupils are equal, round, and reactive to light.  Neck:     Thyroid: No thyromegaly.  Cardiovascular:     Rate and Rhythm: Normal rate and regular rhythm.     Heart sounds: Normal heart  sounds. No murmur heard. Pulmonary:     Effort: Pulmonary effort is normal. No respiratory distress.     Breath sounds: Normal breath sounds. No wheezing.  Abdominal:     General: Bowel sounds are normal. There is no distension.     Palpations: Abdomen is soft.     Tenderness: There is no abdominal tenderness.  Musculoskeletal:        General: No tenderness. Normal range of motion.     Cervical back: Normal range of motion and neck supple.  Skin:    General: Skin is warm and dry.  Neurological:     Mental Status: She is alert and oriented to person, place, and time.     Cranial Nerves: No cranial nerve deficit.     Deep Tendon Reflexes: Reflexes are normal and symmetric.  Psychiatric:        Behavior: Behavior normal.        Thought Content: Thought content normal.        Judgment: Judgment normal.      BP 131/71    Pulse 68    Temp (!) 97.5 F (36.4 C) (Temporal)    Ht 5' 9.5" (1.765 m)    BMI 46.14 kg/m      Assessment & Plan:  Kristine Mata comes in today with chief complaint of Ear Pain (Going down to neck 3-4 days ),  Dizziness, and Headache   Diagnosis and orders addressed:  1. Viral URI - Take meds as prescribed - Use a cool mist humidifier  -Use saline nose sprays frequently -Force fluids -For any cough or congestion  Use plain Mucinex- regular strength or max strength is fine -For fever or aces or pains- take tylenol or ibuprofen. -Throat lozenges if help - cetirizine (ZYRTEC) 10 MG tablet; Take 1 tablet (10 mg total) by mouth daily.  Dispense: 30 tablet; Refill: 11  2. Dysfunction of both eustachian tubes Start zyrtec and flonase and mucinex  - cetirizine (ZYRTEC) 10 MG tablet; Take 1 tablet (10 mg total) by mouth daily.  Dispense: 30 tablet; Refill: [place], [name]

## 2022-01-08 NOTE — Patient Instructions (Signed)
Eustachian Tube Dysfunction °Eustachian tube dysfunction refers to a condition in which a blockage develops in the narrow passage that connects the middle ear to the back of the nose (eustachian tube). The eustachian tube regulates air pressure in the middle ear by letting air move between the ear and nose. It also helps to drain fluid from the middle ear space. °Eustachian tube dysfunction can affect one or both ears. When the eustachian tube does not function properly, air pressure, fluid, or both can build up in the middle ear. °What are the causes? °This condition occurs when the eustachian tube becomes blocked or cannot open normally. Common causes of this condition include: °Ear infections. °Colds and other infections that affect the nose, mouth, and throat (upper respiratory tract). °Allergies. °Irritation from cigarette smoke. °Irritation from stomach acid coming up into the esophagus (gastroesophageal reflux). The esophagus is the part of the body that moves food from the mouth to the stomach. °Sudden changes in air pressure, such as from descending in an airplane or scuba diving. °Abnormal growths in the nose or throat, such as: °Growths that line the nose (nasal polyps). °Abnormal growth of cells (tumors). °Enlarged tissue at the back of the throat (adenoids). °What increases the risk? °You are more likely to develop this condition if: °You smoke. °You are overweight. °You are a child who has: °Certain birth defects of the mouth, such as cleft palate. °Large tonsils or adenoids. °What are the signs or symptoms? °Common symptoms of this condition include: °A feeling of fullness in the ear. °Ear pain. °Clicking or popping noises in the ear. °Ringing in the ear (tinnitus). °Hearing loss. °Loss of balance. °Dizziness. °Symptoms may get worse when the air pressure around you changes, such as when you travel to an area of high elevation, fly on an airplane, or go scuba diving. °How is this diagnosed? °This  condition may be diagnosed based on: °Your symptoms. °A physical exam of your ears, nose, and throat. °Tests, such as those that measure: °The movement of your eardrum. °Your hearing (audiometry). °How is this treated? °Treatment depends on the cause and severity of your condition. °In mild cases, you may relieve your symptoms by moving air into your ears. This is called "popping the ears." °In more severe cases, or if you have symptoms of fluid in your ears, treatment may include: °Medicines to relieve congestion (decongestants). °Medicines that treat allergies (antihistamines). °Nasal sprays or ear drops that contain medicines that reduce swelling (steroids). °A procedure to drain the fluid in your eardrum. In this procedure, a small tube may be placed in the eardrum to: °Drain the fluid. °Restore the air in the middle ear space. °A procedure to insert a balloon device through the nose to inflate the opening of the eustachian tube (balloon dilation). °Follow these instructions at home: °Lifestyle °Do not do any of the following until your health care provider approves: °Travel to high altitudes. °Fly in airplanes. °Work in a pressurized cabin or room. °Scuba dive. °Do not use any products that contain nicotine or tobacco. These products include cigarettes, chewing tobacco, and vaping devices, such as e-cigarettes. If you need help quitting, ask your health care provider. °Keep your ears dry. Wear fitted earplugs during showering and bathing. Dry your ears completely after. °General instructions °Take over-the-counter and prescription medicines only as told by your health care provider. °Use techniques to help pop your ears as recommended by your health care provider. These may include: °Chewing gum. °Yawning. °Frequent, forceful swallowing. °Closing   your mouth, holding your nose closed, and gently blowing as if you are trying to blow air out of your nose. °Keep all follow-up visits. This is important. °Contact a  health care provider if: °Your symptoms do not go away after treatment. °Your symptoms come back after treatment. °You are unable to pop your ears. °You have: °A fever. °Pain in your ear. °Pain in your head or neck. °Fluid draining from your ear. °Your hearing suddenly changes. °You become very dizzy. °You lose your balance. °Get help right away if: °You have a sudden, severe increase in any of your symptoms. °Summary °Eustachian tube dysfunction refers to a condition in which a blockage develops in the eustachian tube. °It can be caused by ear infections, allergies, inhaled irritants, or abnormal growths in the nose or throat. °Symptoms may include ear pain or fullness, hearing loss, or ringing in the ears. °Mild cases are treated with techniques to unblock the ears, such as yawning or chewing gum. °More severe cases are treated with medicines or procedures. °This information is not intended to replace advice given to you by your health care provider. Make sure you discuss any questions you have with your health care provider. °Document Revised: 01/12/2021 Document Reviewed: 01/12/2021 °Elsevier Patient Education © 2022 [employer] ° °

## 2022-01-18 ENCOUNTER — Telehealth: Payer: Self-pay | Admitting: Family Medicine

## 2022-01-19 NOTE — Telephone Encounter (Signed)
I am assuming she is referring to being told after her colonoscopy she has diverticulosis. This does not need medication. She should increase fiber in her diet and make sure she is having regular bowel movements without constipation.  ?

## 2022-01-19 NOTE — Telephone Encounter (Signed)
Patient aware and verbalizes understanding. 

## 2022-01-22 ENCOUNTER — Institutional Professional Consult (permissible substitution): Payer: Medicare HMO | Admitting: Pulmonary Disease

## 2022-03-22 ENCOUNTER — Other Ambulatory Visit: Payer: Self-pay | Admitting: Family Medicine

## 2022-03-22 DIAGNOSIS — I471 Supraventricular tachycardia: Secondary | ICD-10-CM

## 2022-03-22 DIAGNOSIS — I10 Essential (primary) hypertension: Secondary | ICD-10-CM

## 2022-04-05 ENCOUNTER — Other Ambulatory Visit: Payer: Self-pay | Admitting: Family Medicine

## 2022-04-05 DIAGNOSIS — E782 Mixed hyperlipidemia: Secondary | ICD-10-CM

## 2022-04-05 DIAGNOSIS — I10 Essential (primary) hypertension: Secondary | ICD-10-CM

## 2022-04-14 ENCOUNTER — Encounter: Payer: Self-pay | Admitting: Family Medicine

## 2022-04-14 ENCOUNTER — Ambulatory Visit (INDEPENDENT_AMBULATORY_CARE_PROVIDER_SITE_OTHER): Payer: Medicare HMO | Admitting: Family Medicine

## 2022-04-14 ENCOUNTER — Telehealth: Payer: Self-pay | Admitting: Family Medicine

## 2022-04-14 VITALS — BP 131/78 | HR 69 | Temp 97.7°F | Resp 20 | Ht 69.5 in | Wt 317.0 lb

## 2022-04-14 DIAGNOSIS — R1032 Left lower quadrant pain: Secondary | ICD-10-CM

## 2022-04-14 DIAGNOSIS — I471 Supraventricular tachycardia: Secondary | ICD-10-CM | POA: Diagnosis not present

## 2022-04-14 DIAGNOSIS — E782 Mixed hyperlipidemia: Secondary | ICD-10-CM

## 2022-04-14 DIAGNOSIS — G479 Sleep disorder, unspecified: Secondary | ICD-10-CM | POA: Diagnosis not present

## 2022-04-14 DIAGNOSIS — M1711 Unilateral primary osteoarthritis, right knee: Secondary | ICD-10-CM

## 2022-04-14 DIAGNOSIS — I10 Essential (primary) hypertension: Secondary | ICD-10-CM | POA: Diagnosis not present

## 2022-04-14 MED ORDER — METOPROLOL SUCCINATE ER 200 MG PO TB24: 200.0000 mg | ORAL_TABLET | Freq: Every day | ORAL | 0 refills | Status: DC

## 2022-04-14 MED ORDER — LOVASTATIN 40 MG PO TABS: 40.0000 mg | ORAL_TABLET | Freq: Every day | ORAL | 0 refills | Status: DC

## 2022-04-14 MED ORDER — DICYCLOMINE HCL 10 MG PO CAPS: 10.0000 mg | ORAL_CAPSULE | Freq: Four times a day (QID) | ORAL | 0 refills | Status: DC | PRN

## 2022-04-14 MED ORDER — FUROSEMIDE 40 MG PO TABS: 40.0000 mg | ORAL_TABLET | Freq: Every day | ORAL | 0 refills | Status: DC

## 2022-04-14 MED ORDER — POTASSIUM CHLORIDE CRYS ER 10 MEQ PO TBCR: 10.0000 meq | EXTENDED_RELEASE_TABLET | Freq: Every day | ORAL | 0 refills | Status: DC

## 2022-04-14 MED ORDER — TRAZODONE HCL 50 MG PO TABS: 25.0000 mg | ORAL_TABLET | Freq: Every evening | ORAL | 2 refills | Status: DC | PRN

## 2022-04-14 NOTE — Telephone Encounter (Signed)
LMTCB

## 2022-04-14 NOTE — Progress Notes (Signed)
Assessment & Plan:  1. Primary osteoarthritis of right knee Handicap placard form provided.  Patient to follow-up with her orthopedic to discuss next steps.  2. Left lower quadrant abdominal pain Uncontrolled.  Starting Bentyl. - dicyclomine (BENTYL) 10 MG capsule; Take 1 capsule (10 mg total) by mouth 4 (four) times daily as needed for spasms.  Dispense: 60 capsule; Refill: 0  3. Difficulty sleeping Uncontrolled.  Starting trazodone. - traZODone (DESYREL) 50 MG tablet; Take 0.5-1 tablets (25-50 mg total) by mouth at bedtime as needed for sleep.  Dispense: 30 tablet; Refill: 2   Follow up plan: Return in about 6 weeks (around 05/26/2022) for follow-up of chronic medication conditions.  Deliah Boston, MSN, APRN, [name]-C [facility]  Subjective:   Patient ID: Kristine Mata, female    DOB: 1956-10-15, 67 y.o.   MRN: [id number]  HPI: Kristine Mata is a 66 y.o. female presenting on 04/14/2022 for Knee Pain (Right knee - "gives" causing her to fall /) and Abdominal Pain (Since colon in Jan 2023, loose stools and pain after eating )  Patient is requesting a handicap placard due to her right knee pain.  Her pain is causing her knee to give out, resulting in multiple falls.  She does have a follow-up appointment with her orthopedic on 04/27/2022.  Patient also reports abdominal pain after eating and loose stools since she had her colonoscopy completed in January. Abdominal pain is relieved with bowel movements. The loose stools improved when she started taking Metamucil.   Patient reports she is unable to sleep due to worrying about her sister with colon cancer.    ROS: Negative unless specifically indicated above in HPI.   Relevant past medical history reviewed and updated as indicated.   Allergies and medications reviewed and updated.   Current Outpatient Medications:    cetirizine (ZYRTEC) 10 MG tablet, Take 1 tablet (10 mg total) by mouth daily., Disp:  30 tablet, Rfl: 11   fluticasone (FLONASE) 50 MCG/ACT nasal spray, Use 2 spray(s) in each nostril once daily, Disp: 48 g, Rfl: 1   furosemide (LASIX) 40 MG tablet, Take 1 tablet (40 mg total) by mouth daily. (NEEDS TO BE SEEN BEFORE NEXT REFILL), Disp: 30 tablet, Rfl: 0   lovastatin (MEVACOR) 40 MG tablet, Take 1 tablet (40 mg total) by mouth at bedtime. (NEEDS TO BE SEEN BEFORE NEXT REFILL), Disp: 30 tablet, Rfl: 0   metoprolol (TOPROL-XL) 200 MG 24 hr tablet, Take 1 tablet (200 mg total) by mouth daily. (NEEDS TO BE SEEN BEFORE NEXT REFILL), Disp: 30 tablet, Rfl: 0   Multiple Vitamins-Minerals (MULTIVITAMIN WITH MINERALS) tablet, Take 1 tablet by mouth every other day., Disp: , Rfl:    potassium chloride (KLOR-CON) 10 MEQ tablet, Take 1 tablet (10 mEq total) by mouth daily., Disp: 90 tablet, Rfl: 3  Allergies  Allergen Reactions   Anesthesia S-I-40 [Propofol] Nausea And Vomiting   Codeine Nausea And Vomiting   Naproxen Palpitations    Objective:   BP 131/78   Pulse 69   Temp 97.7 F (36.5 C)   Resp 20   Ht 5' 9.5" (1.765 m)   Wt (!) 317 lb (143.8 kg)   SpO2 96%   BMI 46.14 kg/m    Physical Exam Vitals reviewed.  Constitutional:      General: She is not in acute distress.    Appearance: Normal appearance. She is not ill-appearing, toxic-appearing or diaphoretic.  HENT:     Head: Normocephalic  and atraumatic.  Eyes:     General: No scleral icterus.       Right eye: No discharge.        Left eye: No discharge.     Conjunctiva/sclera: Conjunctivae normal.  Cardiovascular:     Rate and Rhythm: Normal rate.  Pulmonary:     Effort: Pulmonary effort is normal. No respiratory distress.  Abdominal:     General: Bowel sounds are normal. There is no distension or abdominal bruit. There are no signs of injury.     Palpations: Abdomen is soft. There is no shifting dullness, fluid wave, hepatomegaly, splenomegaly, mass or pulsatile mass.     Tenderness: There is no abdominal  tenderness.  Musculoskeletal:        General: Normal range of motion.     Cervical back: Normal range of motion.  Skin:    General: Skin is warm and dry.     Capillary Refill: Capillary refill takes less than 2 seconds.  Neurological:     General: No focal deficit present.     Mental Status: She is alert and oriented to person, place, and time. Mental status is at baseline.  Psychiatric:        Mood and Affect: Mood normal.        Behavior: Behavior normal.        Thought Content: Thought content normal.        Judgment: Judgment normal.

## 2022-04-20 NOTE — Telephone Encounter (Signed)
Returned patient's call and answered questions

## 2022-04-23 ENCOUNTER — Other Ambulatory Visit: Payer: Self-pay | Admitting: *Deleted

## 2022-04-23 DIAGNOSIS — E782 Mixed hyperlipidemia: Secondary | ICD-10-CM

## 2022-04-23 MED ORDER — LOVASTATIN 40 MG PO TABS: 40.0000 mg | ORAL_TABLET | Freq: Every day | ORAL | 0 refills | Status: DC

## 2022-04-27 DIAGNOSIS — M170 Bilateral primary osteoarthritis of knee: Secondary | ICD-10-CM | POA: Diagnosis not present

## 2022-05-26 ENCOUNTER — Ambulatory Visit: Payer: Medicare HMO | Admitting: Family Medicine

## 2022-06-14 ENCOUNTER — Ambulatory Visit (INDEPENDENT_AMBULATORY_CARE_PROVIDER_SITE_OTHER): Payer: Medicare HMO

## 2022-06-14 VITALS — Wt 317.0 lb

## 2022-06-14 DIAGNOSIS — Z0000 Encounter for general adult medical examination without abnormal findings: Secondary | ICD-10-CM

## 2022-06-14 NOTE — Progress Notes (Signed)
Virtual Visit via Telephone Note  I connected with  Kristine ParadiseJanice D Rosengren on 06/14/22 at 10:15 AM EDT by telephone and verified that I am speaking with the correct person using two identifiers.  Location: Patient: HOME Provider: [facility] Persons participating in the virtual visit: patient/Nurse Health Advisor   I discussed the limitations, risks, security and privacy concerns of performing an evaluation and management service by telephone and the availability of in person appointments. The patient expressed understanding and agreed to proceed.  Interactive audio and video telecommunications were attempted between this nurse and patient, however failed, due to patient having technical difficulties OR patient did not have access to video capability.  We continued and completed visit with audio only.  Some vital signs may be absent or patient reported.   Hal HopeLorrie S Yunique Dearcos, LPN  Subjective:   Kristine Mata is a 66 y.o. female who presents for Medicare Annual (Subsequent) preventive examination.  Review of Systems     Cardiac Risk Factors include: advanced age (>[id number], 65>65 women);dyslipidemia;hypertension     Objective:    There were no vitals filed for this visit. There is no height or weight on file to calculate BMI.     06/14/2022   10:32 AM 12/02/2021    6:32 AM 06/11/2021   11:21 AM 03/17/2018   10:57 AM  Advanced Directives  Does Patient Have a Medical Advance Directive? No No Yes No  Type of Engineer, productionAdvance Directive   Healthcare Power of Attorney   Copy of Healthcare Power of Attorney in Chart?   No - copy requested   Would patient like information on creating a medical advance directive? No - Patient declined No - Patient declined      Current Medications (verified) Outpatient Encounter Medications as of 06/14/2022  Medication Sig   cetirizine (ZYRTEC) 10 MG tablet Take 1 tablet (10 mg total) by mouth daily.   fluticasone (FLONASE) 50 MCG/ACT nasal spray Use 2 spray(s) in each  nostril once daily   furosemide (LASIX) 40 MG tablet Take 1 tablet (40 mg total) by mouth daily.   lovastatin (MEVACOR) 40 MG tablet Take 1 tablet (40 mg total) by mouth at bedtime.   meloxicam (MOBIC) 15 MG tablet Take by mouth.   metoprolol (TOPROL-XL) 200 MG 24 hr tablet Take 1 tablet (200 mg total) by mouth daily.   Multiple Vitamins-Minerals (MULTIVITAMIN WITH MINERALS) tablet Take 1 tablet by mouth every other day.   potassium chloride (KLOR-CON M) 10 MEQ tablet Take 1 tablet (10 mEq total) by mouth daily.   traZODone (DESYREL) 50 MG tablet Take 0.5-1 tablets (25-50 mg total) by mouth at bedtime as needed for sleep.   dicyclomine (BENTYL) 10 MG capsule Take 1 capsule (10 mg total) by mouth 4 (four) times daily as needed for spasms. (Patient not taking: Reported on 06/14/2022)   [DISCONTINUED] potassium chloride (KLOR-CON) 10 MEQ tablet Take 10 mEq by mouth daily.   No facility-administered encounter medications on file as of 06/14/2022.    Allergies (verified) Anesthesia s-i-40 [propofol], Codeine, and Naproxen   History: Past Medical History:  Diagnosis Date   Ectopic pregnancy    Heart murmur    Hyperlipidemia    Hypertension    Insomnia, unspecified    Osteopenia 03/12/2021   Right knee pain    SVT (supraventricular tachycardia) (HCC)    Past Surgical History:  Procedure Laterality Date   ABDOMINAL HYSTERECTOMY     BREAST CYST EXCISION Left    66 years old  COLONOSCOPY  2016   [facility]; 1 tubular adenoma, 1 hyperplastic polyp, TI biopsy with reactive lymphoid hyperplasia without active or chronic enteritis.   COLONOSCOPY N/A 12/02/2021   Procedure: COLONOSCOPY;  Surgeon: Corbin Ade, MD;  Location: [facility];  Service: Endoscopy;  Laterality: N/A;  7:30am   cyst left breast      SALPINGECTOMY Left    TMJ ARTHROPLASTY     Family History  Problem Relation Age of Onset   Heart disease Mother        used nitroglycerin   Hypertension Mother    Thyroid disease  Mother    Pancreatic cancer Mother    Hypertension Father    Diabetes Sister    Hypertension Sister    Colon cancer Sister        51   Hypertension Sister    Colon cancer Sister        55s   Hypertension Brother    Thyroid disease Brother    Diabetes Brother    Hypertension Brother    COPD Brother    Heart disease Brother    Stroke Brother    COPD Brother    Lung disease Brother    Stroke Maternal Grandmother    Breast cancer Neg Hx    Social History   Socioeconomic History   Marital status: Divorced    Spouse name: Not on file   Number of children: 1   Years of education: Not on file   Highest education level: Not on file  Occupational History   Occupation: Retired  Tobacco Use   Smoking status: Never   Smokeless tobacco: Never  Vaping Use   Vaping Use: Never used  Substance and Sexual Activity   Alcohol use: Never   Drug use: Never   Sexual activity: Not Currently    Birth control/protection: Surgical  Other Topics Concern   Not on file  Social History Narrative   Lives alone. Has one son - lives in [place].   Social Determinants of Health   Financial Resource Strain: Low Risk  (06/14/2022)   Overall Financial Resource Strain (CARDIA)    Difficulty of Paying Living Expenses: Not hard at all  Food Insecurity: No Food Insecurity (06/14/2022)   Hunger Vital Sign    Worried About Running Out of Food in the Last Year: Never true    Ran Out of Food in the Last Year: Never true  Transportation Needs: No Transportation Needs (06/14/2022)   PRAPARE - Administrator, Civil Service (Medical): No    Lack of Transportation (Non-Medical): No  Physical Activity: Sufficiently Active (06/14/2022)   Exercise Vital Sign    Days of Exercise per Week: 5 days    Minutes of Exercise per Session: 30 min  Stress: No Stress Concern Present (06/14/2022)   [employer] of Occupational Health - Occupational Stress Questionnaire    Feeling of Stress : Only a little   Social Connections: Moderately Isolated (06/14/2022)   Social Connection and Isolation Panel [NHANES]    Frequency of Communication with Friends and Family: More than three times a week    Frequency of Social Gatherings with Friends and Family: Three times a week    Attends Religious Services: More than 4 times per year    Active Member of Clubs or Organizations: No    Attends Banker Meetings: Never    Marital Status: Divorced    Tobacco Counseling Counseling given: Not Answered   Clinical Intake:  Pre-visit preparation completed: Yes  Pain : No/denies pain     Nutritional Risks: None Diabetes: No  How often do you need to have someone help you when you read instructions, pamphlets, or other written materials from your doctor or pharmacy?: 1 - Never  Diabetic?YES Nutrition Risk Assessment:  Has the patient had any N/V/D within the last 2 months?  Yes  Does the patient have any non-healing wounds?  No  Has the patient had any unintentional weight loss or weight gain?  No   Diabetes:  Is the patient diabetic?  Yes  If diabetic, was a CBG obtained today?  No  Did the patient bring in their glucometer from home?  No  How often do you monitor your CBG's? NEVER.   Financial Strains and Diabetes Management:  Are you having any financial strains with the device, your supplies or your medication? No .  Does the patient want to be seen by Chronic Care Management for management of their diabetes?  No  Would the patient like to be referred to a Nutritionist or for Diabetic Management?  No   Diabetic Exams:  Diabetic Eye Exam: Completed NO. Overdue for diabetic eye exam. Pt has been advised about the importance in completing this exam.  Diabetic Foot Exam: Completed NO. Pt has been advised about the importance in completing this exam.   Interpreter Needed?: No  Information entered by :: Kennedy Bucker, LPN   Activities of Daily Living    06/14/2022   10:33  AM  In your present state of health, do you have any difficulty performing the following activities:  Hearing? 0  Vision? 0  Difficulty concentrating or making decisions? 0  Walking or climbing stairs? 1  Dressing or bathing? 0  Doing errands, shopping? 0  Preparing Food and eating ? N  Using the Toilet? N  In the past six months, have you accidently leaked urine? N  Do you have problems with loss of bowel control? N  Managing your Medications? N  Managing your Finances? N  Housekeeping or managing your Housekeeping? N    Patient Care Team: Gwenlyn Fudge, [name] as PCP - General (Family Medicine) Jena Gauss Gerrit Friends, MD as Consulting Physician (Gastroenterology)  Indicate any recent Medical Services you may have received from other than [facility] providers in the past year (date may be approximate).     Assessment:   This is a routine wellness examination for Kristine Mata.  Hearing/Vision screen Hearing Screening - Comments:: NO AIDS Vision Screening - Comments:: WEARS GLASSES- [facility]  Dietary issues and exercise activities discussed: Current Exercise Habits: Home exercise routine, Type of exercise: walking, Time (Minutes): 30, Frequency (Times/Week): 5, Weekly Exercise (Minutes/Week): 150, Intensity: Mild   Goals Addressed             This Visit's Progress    DIET - EAT MORE FRUITS AND VEGETABLES         Depression Screen    06/14/2022   10:29 AM 04/14/2022    9:45 AM 12/25/2021    9:25 AM 10/16/2021    9:37 AM 09/03/2021   10:36 AM 06/11/2021   11:09 AM 10/27/2020    9:27 AM  PHQ 2/9 Scores  PHQ - 2 Score 2 3 2 1 1 1  0  PHQ- 9 Score 3 8 4 3 2       Fall Risk    06/14/2022   10:32 AM 04/14/2022    9:44 AM 12/25/2021    9:26 AM 09/03/2021  10:36 AM 06/11/2021   11:20 AM  Fall Risk   Falls in the past year? 1 1 1 1 1   Comment     she was walking in the yard and her left knee gave out  Number falls in past yr: 1 1 0 0 0  Injury with Fall? 0 0 0 0 0  Risk for fall due to  : History of fall(s) Impaired balance/gait;Orthopedic patient No Fall Risks  History of fall(s);Orthopedic patient  Follow up Falls evaluation completed;Falls prevention discussed Falls evaluation completed  Falls prevention discussed Falls prevention discussed    FALL RISK PREVENTION PERTAINING TO THE HOME:  Any stairs in or around the home? No  If so, are there any without handrails? No  Home free of loose throw rugs in walkways, pet beds, electrical cords, etc? Yes  Adequate lighting in your home to reduce risk of falls? Yes   ASSISTIVE DEVICES UTILIZED TO PREVENT FALLS:  Life alert? No  Use of a cane, walker or w/c? Yes  Grab bars in the bathroom? No  Shower chair or bench in shower? No  Elevated toilet seat or a handicapped toilet? No     Cognitive Function:        06/14/2022   10:34 AM 06/11/2021   11:19 AM  6CIT Screen  What Year? 0 points 0 points  What month? 0 points 0 points  What time? 0 points 0 points  Count back from 20 0 points 0 points  Months in reverse 0 points 0 points  Repeat phrase 0 points 0 points  Total Score 0 points 0 points    Immunizations Immunization History  Administered Date(s) Administered   Influenza,inj,Quad PF,6+ Mos 09/06/2017   Influenza-Unspecified 10/14/2015, 08/06/2016, 08/15/2018   Tdap 11/15/2016    TDAP status: Up to date  Flu Vaccine status: Declined, Education has been provided regarding the importance of this vaccine but patient still declined. Advised may receive this vaccine at local pharmacy or Health Dept. Aware to provide a copy of the vaccination record if obtained from local pharmacy or Health Dept. Verbalized acceptance and understanding.  Pneumococcal vaccine status: Declined,  Education has been provided regarding the importance of this vaccine but patient still declined. Advised may receive this vaccine at local pharmacy or Health Dept. Aware to provide a copy of the vaccination record if obtained from local  pharmacy or Health Dept. Verbalized acceptance and understanding.   Covid-19 vaccine status: Declined, Education has been provided regarding the importance of this vaccine but patient still declined. Advised may receive this vaccine at local pharmacy or Health Dept.or vaccine clinic. Aware to provide a copy of the vaccination record if obtained from local pharmacy or Health Dept. Verbalized acceptance and understanding.  Qualifies for Shingles Vaccine? Yes   Zostavax completed No   Shingrix Completed?: No.    Education has been provided regarding the importance of this vaccine. Patient has been advised to call insurance company to determine out of pocket expense if they have not yet received this vaccine. Advised may also receive vaccine at local pharmacy or Health Dept. Verbalized acceptance and understanding.  Screening Tests Health Maintenance  Topic Date Due   COVID-19 Vaccine (1) Never done   Zoster Vaccines- Shingrix (1 of 2) Never done   Pneumonia Vaccine 68+ Years old (1 - PCV) Never done   INFLUENZA VACCINE  06/15/2022   DEXA SCAN  03/13/2023   MAMMOGRAM  07/28/2023   TETANUS/TDAP  11/15/2026   COLONOSCOPY (Pts  45-25yrs Insurance coverage will need to be confirmed)  12/02/2026   Hepatitis C Screening  Completed   HPV VACCINES  Aged Out    Health Maintenance  Health Maintenance Due  Topic Date Due   COVID-19 Vaccine (1) Never done   Zoster Vaccines- Shingrix (1 of 2) Never done   Pneumonia Vaccine 77+ Years old (1 - PCV) Never done    Colorectal cancer screening: Type of screening: Colonoscopy. Completed 12/02/21. Repeat every 5 years  Mammogram status: Completed 07/27/21. Repeat every year  Bone Density status: Completed 03/12/21. Results reflect: Bone density results: OSTEOPENIA. Repeat every 5 years.  Lung Cancer Screening: (Low Dose CT Chest recommended if Age 55-80 years, 30 pack-year currently smoking OR have quit w/in 15years.) does not qualify.   Additional  Screening:  Hepatitis C Screening: does qualify; Completed 08/27/15  Vision Screening: Recommended annual ophthalmology exams for early detection of glaucoma and other disorders of the eye. Is the patient up to date with their annual eye exam?  Yes  Who is the provider or what is the name of the office in which the patient attends annual eye exams? [facility] If pt is not established with a provider, would they like to be referred to a provider to establish care? No .   Dental Screening: Recommended annual dental exams for proper oral hygiene  Community Resource Referral / Chronic Care Management: CRR required this visit?  No   CCM required this visit?  No      Plan:     I have personally reviewed and noted the following in the patient's chart:   Medical and social history Use of alcohol, tobacco or illicit drugs  Current medications and supplements including opioid prescriptions.  Functional ability and status Nutritional status Physical activity Advanced directives List of other physicians Hospitalizations, surgeries, and ER visits in previous 12 months Vitals Screenings to include cognitive, depression, and falls Referrals and appointments  In addition, I have reviewed and discussed with patient certain preventive protocols, quality metrics, and best practice recommendations. A written personalized care plan for preventive services as well as general preventive health recommendations were provided to patient.     Hal Hope, LPN   05/17/5008   Nurse Notes: [facility]

## 2022-06-14 NOTE — Patient Instructions (Signed)
Kristine Mata , Thank you for taking time to come for your Medicare Wellness Visit. I appreciate your ongoing commitment to your health goals. Please review the following plan we discussed and let me know if I can assist you in the future.   Screening recommendations/referrals: Colonoscopy: 12/02/21 Mammogram: 07/27/21 Bone Density: 03/12/21 Recommended yearly ophthalmology/optometry visit for glaucoma screening and checkup Recommended yearly dental visit for hygiene and checkup  Vaccinations: Influenza vaccine: N/D Pneumococcal vaccine: N/D Tdap vaccine: 11/15/16 Shingles vaccine: N/D   Covid-19:N/D  Advanced directives: NO  Conditions/risks identified: NONE  Next appointment: Follow up in one year for your annual wellness visit    Preventive Care 65 Years and Older, Female Preventive care refers to lifestyle choices and visits with your health care provider that can promote health and wellness. What does preventive care include? A yearly physical exam. This is also called an annual well check. Dental exams once or twice a year. Routine eye exams. Ask your health care provider how often you should have your eyes checked. Personal lifestyle choices, including: Daily care of your teeth and gums. Regular physical activity. Eating a healthy diet. Avoiding tobacco and drug use. Limiting alcohol use. Practicing safe sex. Taking low-dose aspirin every day. Taking vitamin and mineral supplements as recommended by your health care provider. What happens during an annual well check? The services and screenings done by your health care provider during your annual well check will depend on your age, overall health, lifestyle risk factors, and family history of disease. Counseling  Your health care provider may ask you questions about your: Alcohol use. Tobacco use. Drug use. Emotional well-being. Home and relationship well-being. Sexual activity. Eating habits. History of  falls. Memory and ability to understand (cognition). Work and work Astronomer. Reproductive health. Screening  You may have the following tests or measurements: Height, weight, and BMI. Blood pressure. Lipid and cholesterol levels. These may be checked every 5 years, or more frequently if you are over 23 years old. Skin check. Lung cancer screening. You may have this screening every year starting at age 102 if you have a 30-pack-year history of smoking and currently smoke or have quit within the past 15 years. Fecal occult blood test (FOBT) of the stool. You may have this test every year starting at age 95. Flexible sigmoidoscopy or colonoscopy. You may have a sigmoidoscopy every 5 years or a colonoscopy every 10 years starting at age 76. Hepatitis C blood test. Hepatitis B blood test. Sexually transmitted disease (STD) testing. Diabetes screening. This is done by checking your blood sugar (glucose) after you have not eaten for a while (fasting). You may have this done every 1-3 years. Bone density scan. This is done to screen for osteoporosis. You may have this done starting at age 16. Mammogram. This may be done every 1-2 years. Talk to your health care provider about how often you should have regular mammograms. Talk with your health care provider about your test results, treatment options, and if necessary, the need for more tests. Vaccines  Your health care provider may recommend certain vaccines, such as: Influenza vaccine. This is recommended every year. Tetanus, diphtheria, and acellular pertussis (Tdap, Td) vaccine. You may need a Td booster every 10 years. Zoster vaccine. You may need this after age 70. Pneumococcal 13-valent conjugate (PCV13) vaccine. One dose is recommended after age 40. Pneumococcal polysaccharide (PPSV23) vaccine. One dose is recommended after age 50. Talk to your health care provider about which screenings and vaccines you  need and how often you need  them. This information is not intended to replace advice given to you by your health care provider. Make sure you discuss any questions you have with your health care provider. Document Released: 11/28/2015 Document Revised: 07/21/2016 Document Reviewed: 09/02/2015 Elsevier Interactive Patient Education  2017 [place] Prevention in the Home Falls can cause injuries. They can happen to people of all ages. There are many things you can do to make your home safe and to help prevent falls. What can I do on the outside of my home? Regularly fix the edges of walkways and driveways and fix any cracks. Remove anything that might make you trip as you walk through a door, such as a raised step or threshold. Trim any bushes or trees on the path to your home. Use bright outdoor lighting. Clear any walking paths of anything that might make someone trip, such as rocks or tools. Regularly check to see if handrails are loose or broken. Make sure that both sides of any steps have handrails. Any raised decks and porches should have guardrails on the edges. Have any leaves, snow, or ice cleared regularly. Use sand or salt on walking paths during winter. Clean up any spills in your garage right away. This includes oil or grease spills. What can I do in the bathroom? Use night lights. Install grab bars by the toilet and in the tub and shower. Do not use towel bars as grab bars. Use non-skid mats or decals in the tub or shower. If you need to sit down in the shower, use a plastic, non-slip stool. Keep the floor dry. Clean up any water that spills on the floor as soon as it happens. Remove soap buildup in the tub or shower regularly. Attach bath mats securely with double-sided non-slip rug tape. Do not have throw rugs and other things on the floor that can make you trip. What can I do in the bedroom? Use night lights. Make sure that you have a light by your bed that is easy to reach. Do not use  any sheets or blankets that are too big for your bed. They should not hang down onto the floor. Have a firm chair that has side arms. You can use this for support while you get dressed. Do not have throw rugs and other things on the floor that can make you trip. What can I do in the kitchen? Clean up any spills right away. Avoid walking on wet floors. Keep items that you use a lot in easy-to-reach places. If you need to reach something above you, use a strong step stool that has a grab bar. Keep electrical cords out of the way. Do not use floor polish or wax that makes floors slippery. If you must use wax, use non-skid floor wax. Do not have throw rugs and other things on the floor that can make you trip. What can I do with my stairs? Do not leave any items on the stairs. Make sure that there are handrails on both sides of the stairs and use them. Fix handrails that are broken or loose. Make sure that handrails are as long as the stairways. Check any carpeting to make sure that it is firmly attached to the stairs. Fix any carpet that is loose or worn. Avoid having throw rugs at the top or bottom of the stairs. If you do have throw rugs, attach them to the floor with carpet tape. Make sure that  you have a light switch at the top of the stairs and the bottom of the stairs. If you do not have them, ask someone to add them for you. What else can I do to help prevent falls? Wear shoes that: Do not have high heels. Have rubber bottoms. Are comfortable and fit you well. Are closed at the toe. Do not wear sandals. If you use a stepladder: Make sure that it is fully opened. Do not climb a closed stepladder. Make sure that both sides of the stepladder are locked into place. Ask someone to hold it for you, if possible. Clearly mark and make sure that you can see: Any grab bars or handrails. First and last steps. Where the edge of each step is. Use tools that help you move around (mobility aids)  if they are needed. These include: Canes. Walkers. Scooters. Crutches. Turn on the lights when you go into a dark area. Replace any light bulbs as soon as they burn out. Set up your furniture so you have a clear path. Avoid moving your furniture around. If any of your floors are uneven, fix them. If there are any pets around you, be aware of where they are. Review your medicines with your doctor. Some medicines can make you feel dizzy. This can increase your chance of falling. Ask your doctor what other things that you can do to help prevent falls. This information is not intended to replace advice given to you by your health care provider. Make sure you discuss any questions you have with your health care provider. Document Released: 08/28/2009 Document Revised: 04/08/2016 Document Reviewed: 12/06/2014 Elsevier Interactive Patient Education  2017 [employer].

## 2022-06-17 ENCOUNTER — Ambulatory Visit: Payer: Medicare HMO | Admitting: Family Medicine

## 2022-06-22 ENCOUNTER — Ambulatory Visit (INDEPENDENT_AMBULATORY_CARE_PROVIDER_SITE_OTHER): Payer: Medicare HMO | Admitting: Family Medicine

## 2022-06-22 ENCOUNTER — Encounter: Payer: Self-pay | Admitting: Family Medicine

## 2022-06-22 VITALS — BP 134/71 | HR 60 | Temp 97.6°F | Resp 20 | Ht 69.5 in | Wt 310.0 lb

## 2022-06-22 DIAGNOSIS — M1711 Unilateral primary osteoarthritis, right knee: Secondary | ICD-10-CM | POA: Diagnosis not present

## 2022-06-22 DIAGNOSIS — I471 Supraventricular tachycardia: Secondary | ICD-10-CM

## 2022-06-22 DIAGNOSIS — I10 Essential (primary) hypertension: Secondary | ICD-10-CM | POA: Diagnosis not present

## 2022-06-22 DIAGNOSIS — G8929 Other chronic pain: Secondary | ICD-10-CM

## 2022-06-22 DIAGNOSIS — R69 Illness, unspecified: Secondary | ICD-10-CM | POA: Diagnosis not present

## 2022-06-22 DIAGNOSIS — F4321 Adjustment disorder with depressed mood: Secondary | ICD-10-CM

## 2022-06-22 DIAGNOSIS — H9202 Otalgia, left ear: Secondary | ICD-10-CM

## 2022-06-22 DIAGNOSIS — R1319 Other dysphagia: Secondary | ICD-10-CM | POA: Diagnosis not present

## 2022-06-22 DIAGNOSIS — E782 Mixed hyperlipidemia: Secondary | ICD-10-CM | POA: Diagnosis not present

## 2022-06-22 DIAGNOSIS — M25561 Pain in right knee: Secondary | ICD-10-CM | POA: Diagnosis not present

## 2022-06-22 DIAGNOSIS — G479 Sleep disorder, unspecified: Secondary | ICD-10-CM | POA: Diagnosis not present

## 2022-06-22 MED ORDER — METOPROLOL SUCCINATE ER 200 MG PO TB24: 200.0000 mg | ORAL_TABLET | Freq: Every day | ORAL | 1 refills | Status: DC

## 2022-06-22 MED ORDER — FUROSEMIDE 40 MG PO TABS: 40.0000 mg | ORAL_TABLET | Freq: Every day | ORAL | 1 refills | Status: DC

## 2022-06-22 MED ORDER — LOVASTATIN 40 MG PO TABS: 40.0000 mg | ORAL_TABLET | Freq: Every day | ORAL | 1 refills | Status: DC

## 2022-06-22 NOTE — Progress Notes (Signed)
Assessment & Plan:  1. Essential hypertension Well controlled on current regimen.  - furosemide (LASIX) 40 MG tablet; Take 1 tablet (40 mg total) by mouth daily.  Dispense: 90 tablet; Refill: 1 - metoprolol (TOPROL-XL) 200 MG 24 hr tablet; Take 1 tablet (200 mg total) by mouth daily.  Dispense: 90 tablet; Refill: 1 - CBC with Differential/Platelet - CMP14+EGFR - Lipid panel - Thyroid Panel With TSH  2. PSVT (paroxysmal supraventricular tachycardia) (HCC) Well controlled on current regimen.  - metoprolol (TOPROL-XL) 200 MG 24 hr tablet; Take 1 tablet (200 mg total) by mouth daily.  Dispense: 90 tablet; Refill: 1 - CBC with Differential/Platelet - CMP14+EGFR  3. Mixed hyperlipidemia Labs to reassess. - lovastatin (MEVACOR) 40 MG tablet; Take 1 tablet (40 mg total) by mouth at bedtime.  Dispense: 100 tablet; Refill: 1 - CBC with Differential/Platelet - CMP14+EGFR - Lipid panel  4-5. Primary osteoarthritis of right knee/Chronic pain of right knee Encouraged to follow-up with ortho to discuss a knee replacement.  - CMP14+EGFR  6. Morbid obesity ([place]) Congratulated on 7 lb weight loss. Encouraged healthy eating and exercise. Discussed ortho may require her to get down to a certain weight before they would perform surgery. - CBC with Differential/Platelet - CMP14+EGFR - Lipid panel - Thyroid Panel With TSH  7. Grief reaction Well controlled on current regimen.  - CMP14+EGFR  8. Difficulty sleeping Well controlled on current regimen.  - CMP14+EGFR  9. Left ear pain No abnormalities seen in left ear. I feel like her ear is having pain in relation to the neck.  10. Esophageal dysphagia Sounds like this occurs when she is having ear and neck pain - ? Swelling. TSH to assess thyroid.  - Thyroid Panel With TSH   Return in about 6 months (around 12/23/2022) for annual physical.  Kristine Limes, MSN, APRN, [name]-C [facility]  Subjective:    Patient ID:  Kristine Mata, female    DOB: [birth year], 66 y.o.   MRN: [id number]  Patient Care Team: Kristine Brooklyn, [name] as PCP - General (Family Medicine) Kristine Romney Cristopher Estimable, MD as Consulting Physician (Gastroenterology)   Chief Complaint:  Chief Complaint  Patient presents with   Medical Management of Chronic Issues    6 mo follow up     HPI: Kristine Mata is a 66 y.o. female presenting on 06/22/2022 for Medical Management of Chronic Issues (6 mo follow up )  Hypertension: Patient here for follow-up of elevated blood pressure. She is exercising and is adherent to low salt diet.  She does check her blood pressure at home and reports readings 130s/70s. Cardiac symptoms lower extremity edema.  Cardiovascular risk factors: dyslipidemia, hypertension, and obesity (BMI >= 30 kg/m2). Use of agents associated with hypertension: none. History of target organ damage: none.   SVT: taking metoprolol daily.  Hyperlipidemia: she is on lovastatin and tolerating it. She has previously failed atorvastatin, rosuvastatin, and pravastatin.   The 10-year ASCVD risk score (Arnett DK, et al., 2019) is: 11%   Values used to calculate the score:     Age: 32 years     Sex: Female     Is Non-Hispanic African American: Yes     Diabetic: No     Tobacco smoker: No     Systolic Blood Pressure: 630 mmHg     Is BP treated: Yes     HDL Cholesterol: 73 mg/dL     Total Cholesterol: 202 mg/dL  Knee pain: She has  severe right osteoarthritis. She has tried tylenol for the pain but it is not very helpful. She currently has meloxicam provided by ortho. She has had steroid injections in the past that have helped.  Her last steroid injection was 2 months ago with Dr. Noemi Mata, but she states her knee continues to give out on her and cause her pain.  She was on Naproxen at one point but she did not tolerate it well. She uses CBD and voltaran gel on the knee and it helps somewhat. She states at night she can feel pain shooting up the  knee to her thigh.   Grief/Sleep: Her sister passed away last year and another sister is currently battle cancer. She states she is able to sleep with Trazodone as needed, sometimes she does not need it.     06/22/2022    2:00 PM 06/14/2022   10:29 AM 04/14/2022    9:45 AM  Depression screen PHQ 2/9  Decreased Interest 0 1 1  Down, Depressed, Hopeless _0 PHQ - 2 Score _1 Altered sleeping 1 0 2  Tired, decreased energy _2 Change in appetite 1 0 2  Feeling bad or failure about yourself  0 0 0  Trouble concentrating 0 0 0  Moving slowly or fidgety/restless 0 0 0  Suicidal thoughts 0 0 0  PHQ-9 Score _3 Difficult doing work/chores Somewhat difficult Not difficult at all Somewhat difficult      06/22/2022    2:00 PM 04/14/2022    9:46 AM 12/25/2021    9:25 AM 10/16/2021    9:37 AM  GAD 7 : Generalized Anxiety Score  Nervous, Anxious, on Edge 0 0 0 1  Control/stop worrying _4 0  Worry too much - different things _5 Trouble relaxing _6 Restless 0 0 0 0  Easily annoyed or irritable 0 0 0 0  Afraid - awful might happen _7 Total GAD 7 Score _8 Anxiety Difficulty Somewhat difficult Somewhat difficult  Somewhat difficult    New Complaints: She reports her left ear hurts on and off for months now.  It is accompanied by pain in her neck.  States when this happens it sounds like a drum beating in her ear.  It is worse at night.  She does have trouble swallowing food and pills when this is happening.   Social history:  Relevant past medical, surgical, family and social history reviewed and updated as indicated. Interim medical history since our last visit reviewed.  Allergies and medications reviewed and updated.  DATA REVIEWED: CHART IN EPIC  ROS: Negative unless specifically indicated above in HPI.    Current Outpatient Medications:    cetirizine (ZYRTEC) 10 MG tablet, Take 1 tablet (10 mg total) by mouth daily., Disp: 30 tablet, Rfl: 11    fluticasone (FLONASE) 50 MCG/ACT nasal spray, Use 2 spray(s) in each nostril once daily, Disp: 48 g, Rfl: 1   furosemide (LASIX) 40 MG tablet, Take 1 tablet (40 mg total) by mouth daily., Disp: 90 tablet, Rfl: 0   lovastatin (MEVACOR) 40 MG tablet, Take 1 tablet (40 mg total) by mouth at bedtime., Disp: 100 tablet, Rfl: 0   meloxicam (MOBIC) 15 MG tablet, Take 15 mg by mouth daily., Disp: , Rfl:    metoprolol (TOPROL-XL) 200 MG 24 hr tablet, Take 1 tablet (200 mg total)  by mouth daily., Disp: 90 tablet, Rfl: 0   Multiple Vitamins-Minerals (MULTIVITAMIN WITH MINERALS) tablet, Take 1 tablet by mouth every other day., Disp: , Rfl:    potassium chloride (KLOR-CON M) 10 MEQ tablet, Take 1 tablet (10 mEq total) by mouth daily., Disp: 90 tablet, Rfl: 0   traZODone (DESYREL) 50 MG tablet, Take 0.5-1 tablets (25-50 mg total) by mouth at bedtime as needed for sleep., Disp: 30 tablet, Rfl: 2   dicyclomine (BENTYL) 10 MG capsule, Take 1 capsule (10 mg total) by mouth 4 (four) times daily as needed for spasms. (Patient not taking: Reported on 06/14/2022), Disp: 60 capsule, Rfl: 0   Allergies  Allergen Reactions   Anesthesia S-I-40 [Propofol] Nausea And Vomiting   Codeine Nausea And Vomiting   Naproxen Palpitations   Past Medical History:  Diagnosis Date   Ectopic pregnancy    Heart murmur    Hyperlipidemia    Hypertension    Insomnia, unspecified    Osteopenia 03/12/2021   Right knee pain    SVT (supraventricular tachycardia) (HCC)     Past Surgical History:  Procedure Laterality Date   ABDOMINAL HYSTERECTOMY     BREAST CYST EXCISION Left    66 years old   COLONOSCOPY  2016   [facility]; 1 tubular adenoma, 1 hyperplastic polyp, TI biopsy with reactive lymphoid hyperplasia without active or chronic enteritis.   COLONOSCOPY N/A 12/02/2021   Procedure: COLONOSCOPY;  Surgeon: Daneil Dolin, MD;  Location: [facility];  Service: Endoscopy;  Laterality: N/A;  7:30am   cyst left breast       SALPINGECTOMY Left    TMJ ARTHROPLASTY      Social History   Socioeconomic History   Marital status: Divorced    Spouse name: Not on file   Number of children: 1   Years of education: Not on file   Highest education level: Not on file  Occupational History   Occupation: Retired  Tobacco Use   Smoking status: Never   Smokeless tobacco: Never  Vaping Use   Vaping Use: Never used  Substance and Sexual Activity   Alcohol use: Never   Drug use: Never   Sexual activity: Not Currently    Birth control/protection: Surgical  Other Topics Concern   Not on file  Social History Narrative   Lives alone. Has one son - lives in [place].   Social Determinants of Health   Financial Resource Strain: Low Risk  (06/14/2022)   Overall Financial Resource Strain (CARDIA)    Difficulty of Paying Living Expenses: Not hard at all  Food Insecurity: No Food Insecurity (06/14/2022)   Hunger Vital Sign    Worried About Running Out of Food in the Last Year: Never true    Ran Out of Food in the Last Year: Never true  Transportation Needs: No Transportation Needs (06/14/2022)   PRAPARE - Hydrologist (Medical): No    Lack of Transportation (Non-Medical): No  Physical Activity: Sufficiently Active (06/14/2022)   Exercise Vital Sign    Days of Exercise per Week: 5 days    Minutes of Exercise per Session: 30 min  Stress: No Stress Concern Present (06/14/2022)   [place]    Feeling of Stress : Only a little  Social Connections: Moderately Isolated (06/14/2022)   Social Connection and Isolation Panel [NHANES]    Frequency of Communication with Friends and Family: More than three times a week  Frequency of Social Gatherings with Friends and Family: Three times a week    Attends Religious Services: More than 4 times per year    Active Member of Clubs or Organizations: No    Attends Archivist Meetings:  Never    Marital Status: Divorced  Human resources officer Violence: Not At Risk (06/14/2022)   Humiliation, Afraid, Rape, and Kick questionnaire    Fear of Current or Ex-Partner: No    Emotionally Abused: No    Physically Abused: No    Sexually Abused: No        Objective:    BP 134/71   Pulse 60   Temp 97.6 F (36.4 C)   Resp 20   Ht 5' 9.5" (1.765 m)   Wt (!) 310 lb (140.6 kg)   SpO2 94%   BMI 45.12 kg/m   Wt Readings from Last 3 Encounters:  06/22/22 (!) 310 lb (140.6 kg)  06/14/22 (!) 317 lb (143.8 kg)  04/14/22 (!) 317 lb (143.8 kg)    Physical Exam Vitals reviewed.  Constitutional:      General: She is not in acute distress.    Appearance: Normal appearance. She is morbidly obese. She is not ill-appearing, toxic-appearing or diaphoretic.  HENT:     Head: Normocephalic and atraumatic.     Right Ear: Tympanic membrane, ear canal and external ear normal. There is no impacted cerumen.     Left Ear: Tympanic membrane, ear canal and external ear normal. There is no impacted cerumen.     Nose: Nose normal.     Mouth/Throat:     Mouth: Mucous membranes are moist.     Pharynx: Oropharynx is clear.  Eyes:     Extraocular Movements: Extraocular movements intact.     Conjunctiva/sclera: Conjunctivae normal.     Pupils: Pupils are equal, round, and reactive to light.  Cardiovascular:     Rate and Rhythm: Normal rate and regular rhythm.     Pulses: Normal pulses.     Heart sounds: Normal heart sounds.  Pulmonary:     Effort: Pulmonary effort is normal. No respiratory distress.     Breath sounds: Normal breath sounds.  Abdominal:     General: There is no distension.     Palpations: Abdomen is soft. There is no mass.  Musculoskeletal:     Cervical back: Normal range of motion.     Right knee: Decreased range of motion. Tenderness present.  Lymphadenopathy:     Cervical: No cervical adenopathy.  Skin:    General: Skin is warm and dry.  Neurological:     General: No  focal deficit present.     Mental Status: She is alert and oriented to person, place, and time.     Motor: No weakness.     Gait: Gait normal.  Psychiatric:        Mood and Affect: Mood normal.        Behavior: Behavior normal.        Thought Content: Thought content normal.        Judgment: Judgment normal.     Lab Results  Component Value Date   TSH 0.627 03/10/2021   Lab Results  Component Value Date   WBC 6.9 03/10/2021   HGB 12.7 03/10/2021   HCT 39.5 03/10/2021   MCV 90 03/10/2021   PLT 293 03/10/2021   Lab Results  Component Value Date   NA 143 09/03/2021   K 4.6 09/03/2021   CO2 28 09/03/2021  GLUCOSE 104 (H) 09/03/2021   BUN 9 09/03/2021   CREATININE 0.61 09/03/2021   BILITOT 0.3 09/03/2021   ALKPHOS 153 (H) 09/03/2021   AST 20 09/03/2021   ALT 15 09/03/2021   PROT 7.1 09/03/2021   ALBUMIN 4.7 09/03/2021   CALCIUM 10.9 (H) 09/03/2021   ANIONGAP 12 03/17/2018   EGFR 99 09/03/2021   Lab Results  Component Value Date   CHOL 202 (H) 09/03/2021   Lab Results  Component Value Date   HDL 73 09/03/2021   Lab Results  Component Value Date   LDLCALC 115 (H) 09/03/2021   Lab Results  Component Value Date   TRIG 77 09/03/2021   Lab Results  Component Value Date   CHOLHDL 2.8 09/03/2021   No results found for: "HGBA1C"

## 2022-06-23 LAB — LIPID PANEL
Chol/HDL Ratio: 2.3 ratio (ref 0.0–4.4)
Cholesterol, Total: 194 mg/dL (ref 100–199)
HDL: 84 mg/dL (ref >39)
LDL Chol Calc (NIH): 95 mg/dL (ref 0–99)
Triglycerides: 84 mg/dL (ref 0–149)
VLDL Cholesterol Cal: 15 mg/dL (ref 5–40)

## 2022-06-23 LAB — CBC WITH DIFFERENTIAL/PLATELET
Basophils Absolute: 0 10*3/uL (ref 0.0–0.2)
Basos: 1 %
EOS (ABSOLUTE): 0.1 10*3/uL (ref 0.0–0.4)
Eos: 2 %
Hematocrit: 40 % (ref 34.0–46.6)
Hemoglobin: 12.9 g/dL (ref 11.1–15.9)
Immature Grans (Abs): 0 10*3/uL (ref 0.0–0.1)
Immature Granulocytes: 0 %
Lymphocytes Absolute: 3 10*3/uL (ref 0.7–3.1)
Lymphs: 45 %
MCH: 28.9 pg (ref 26.6–33.0)
MCHC: 32.3 g/dL (ref 31.5–35.7)
MCV: 90 fL (ref 79–97)
Monocytes Absolute: 0.5 10*3/uL (ref 0.1–0.9)
Monocytes: 8 %
Neutrophils Absolute: 2.9 10*3/uL (ref 1.4–7.0)
Neutrophils: 44 %
Platelets: 304 10*3/uL (ref 150–450)
RBC: 4.47 x10E6/uL (ref 3.77–5.28)
RDW: 14 % (ref 11.7–15.4)
WBC: 6.6 10*3/uL (ref 3.4–10.8)

## 2022-06-23 LAB — CMP14+EGFR
ALT: 16 IU/L (ref 0–32)
AST: 24 IU/L (ref 0–40)
Albumin/Globulin Ratio: 1.5 (ref 1.2–2.2)
Albumin: 4.3 g/dL (ref 3.9–4.9)
Alkaline Phosphatase: 173 IU/L — ABNORMAL HIGH (ref 44–121)
BUN/Creatinine Ratio: 12 (ref 12–28)
BUN: 9 mg/dL (ref 8–27)
Bilirubin Total: 0.3 mg/dL (ref 0.0–1.2)
CO2: 26 mmol/L (ref 20–29)
Calcium: 11 mg/dL — ABNORMAL HIGH (ref 8.7–10.3)
Chloride: 102 mmol/L (ref 96–106)
Creatinine, Ser: 0.73 mg/dL (ref 0.57–1.00)
Globulin, Total: 2.8 g/dL (ref 1.5–4.5)
Glucose: 102 mg/dL — ABNORMAL HIGH (ref 70–99)
Potassium: 4.7 mmol/L (ref 3.5–5.2)
Sodium: 144 mmol/L (ref 134–144)
Total Protein: 7.1 g/dL (ref 6.0–8.5)
eGFR: 90 mL/min/{1.73_m2} (ref >59)

## 2022-06-23 LAB — THYROID PANEL WITH TSH
Free Thyroxine Index: 1.6 (ref 1.2–4.9)
T3 Uptake Ratio: 24 % (ref 24–39)
T4, Total: 6.5 ug/dL (ref 4.5–12.0)
TSH: 0.446 u[IU]/mL — ABNORMAL LOW (ref 0.450–4.500)

## 2022-06-24 ENCOUNTER — Other Ambulatory Visit: Payer: Self-pay | Admitting: Family Medicine

## 2022-06-24 DIAGNOSIS — Z1231 Encounter for screening mammogram for malignant neoplasm of breast: Secondary | ICD-10-CM

## 2022-06-29 ENCOUNTER — Other Ambulatory Visit: Payer: Self-pay | Admitting: Family Medicine

## 2022-06-29 DIAGNOSIS — R7989 Other specified abnormal findings of blood chemistry: Secondary | ICD-10-CM

## 2022-07-09 ENCOUNTER — Ambulatory Visit: Payer: Medicare HMO

## 2022-07-16 ENCOUNTER — Ambulatory Visit (HOSPITAL_COMMUNITY)
Admission: RE | Admit: 2022-07-16 | Discharge: 2022-07-16 | Disposition: A | Discharge status: Home / Self Care | Payer: Medicare HMO | Source: Ambulatory Visit | Attending: Family Medicine | Admitting: Family Medicine

## 2022-07-16 DIAGNOSIS — E042 Nontoxic multinodular goiter: Secondary | ICD-10-CM | POA: Diagnosis not present

## 2022-07-16 DIAGNOSIS — R1310 Dysphagia, unspecified: Secondary | ICD-10-CM | POA: Diagnosis not present

## 2022-07-16 DIAGNOSIS — R7989 Other specified abnormal findings of blood chemistry: Secondary | ICD-10-CM | POA: Insufficient documentation

## 2022-07-23 ENCOUNTER — Ambulatory Visit (INDEPENDENT_AMBULATORY_CARE_PROVIDER_SITE_OTHER): Payer: Medicare HMO | Admitting: Family

## 2022-07-23 ENCOUNTER — Encounter: Payer: Self-pay | Admitting: Family

## 2022-07-23 DIAGNOSIS — J069 Acute upper respiratory infection, unspecified: Secondary | ICD-10-CM

## 2022-07-23 MED ORDER — BENZONATATE 200 MG PO CAPS: 200.0000 mg | ORAL_CAPSULE | Freq: Three times a day (TID) | ORAL | 1 refills | Status: DC | PRN

## 2022-07-23 MED ORDER — LORATADINE 10 MG PO TABS: 10.0000 mg | ORAL_TABLET | Freq: Every day | ORAL | 1 refills | Status: DC

## 2022-07-23 NOTE — Progress Notes (Signed)
Virtual Visit  Note Due to COVID-19 pandemic this visit was conducted virtually. This visit type was conducted due to national recommendations for restrictions regarding the COVID-19 Pandemic (e.g. social distancing, sheltering in place) in an effort to limit this patient's exposure and mitigate transmission in our community. All issues noted in this document were discussed and addressed.  A physical exam was not performed with this format.  I connected with Kristine Mata on 07/23/22 at 10:18 AM by telephone and verified that I am speaking with the correct person using two identifiers. Kristine Mata is currently located at home and no one is currently with her during visit. The provider, Jannifer Rodney, [name] is located in their office at time of visit.  I discussed the limitations, risks, security and privacy concerns of performing an evaluation and management service by telephone and the availability of in person appointments. I also discussed with the patient that there may be a patient responsible charge related to this service. The patient expressed understanding and agreed to proceed.  Kristine Mata are scheduled for a virtual visit with your provider today.    Just as we do with appointments in the office, we must obtain your consent to participate.  Your consent will be active for this visit and any virtual visit you may have with one of our providers in the next 365 days.    If you have a MyChart account, I can also send a copy of this consent to you electronically.  All virtual visits are billed to your insurance company just like a traditional visit in the office.  As this is a virtual visit, video technology does not allow for your provider to perform a traditional examination.  This may limit your provider's ability to fully assess your condition.  If your provider identifies any concerns that need to be evaluated in person or the need to arrange testing such as labs, EKG, etc, we  will make arrangements to do so.    Although advances in technology are sophisticated, we cannot ensure that it will always work on either your end or our end.  If the connection with a video visit is poor, we may have to switch to a telephone visit.  With either a video or telephone visit, we are not always able to ensure that we have a secure connection.   I need to obtain your verbal consent now.   Are you willing to proceed with your visit today?   Kristine Mata has provided verbal consent on 07/23/2022 for a virtual visit (video or telephone).   Jannifer Rodney, [place] 07/23/2022  10:24 AM    History and Present Illness:  Pt calls the office today with sinus problems that started 3 days ago. Reports she took a home COVID test that was negative.  Sinusitis This is a new problem. The current episode started in the past 7 days. The problem has been gradually worsening since onset. There has been no fever. Her pain is at a severity of 8/10. The pain is mild. Associated symptoms include chills, congestion, coughing, ear pain (left), headaches, sinus pressure and a sore throat. Pertinent negatives include no shortness of breath or sneezing. (wheezing) Past treatments include oral decongestants and acetaminophen. The treatment provided mild relief.     Review of Systems  Constitutional:  Positive for chills.  HENT:  Positive for congestion, ear pain (left), sinus pressure and sore throat. Negative for sneezing.   Respiratory:  Positive for cough.  Negative for shortness of breath.   Neurological:  Positive for headaches.     Observations/Objective: No SOB or distress noted   Assessment and Plan: 1. Viral URI with cough - Take meds as prescribed - Use a cool mist humidifier  -Use saline nose sprays frequently -Force fluids -For any cough or congestion  Use plain Mucinex- regular strength or max strength is fine -For fever or aces or pains- take tylenol or ibuprofen. -Throat lozenges if  help -Follow up if symptoms do not improve or worsen. She will call Monday if not feeling better and I will send in antibiotic.  - loratadine (CLARITIN) 10 MG tablet; Take 1 tablet (10 mg total) by mouth daily.  Dispense: 90 tablet; Refill: 1 - benzonatate (TESSALON) 200 MG capsule; Take 1 capsule (200 mg total) by mouth 3 (three) times daily as needed.  Dispense: 30 capsule; Refill: 1    I discussed the assessment and treatment plan with the patient. The patient was provided an opportunity to ask questions and all were answered. The patient agreed with the plan and demonstrated an understanding of the instructions.   The patient was advised to call back or seek an in-person evaluation if the symptoms worsen or if the condition fails to improve as anticipated.  The above assessment and management plan was discussed with the patient. The patient verbalized understanding of and has agreed to the management plan. Patient is aware to call the clinic if symptoms persist or worsen. Patient is aware when to return to the clinic for a follow-up visit. Patient educated on when it is appropriate to go to the emergency department.   Time call ended:  10:31 AM   I provided 13 minutes of  non face-to-face time during this encounter.    Jannifer Rodney, [name]

## 2022-07-27 ENCOUNTER — Telehealth: Payer: Self-pay | Admitting: Family Medicine

## 2022-07-27 NOTE — Telephone Encounter (Signed)
Pt wants to let nurse that covid test is negative.

## 2022-07-27 NOTE — Telephone Encounter (Signed)
Called pt states she hada tele with you on Friday with a negative covid test- I advised pt to retest and to call us with her results.

## 2022-07-28 ENCOUNTER — Ambulatory Visit: Payer: Medicare HMO

## 2022-07-29 MED ORDER — DOXYCYCLINE HYCLATE 100 MG PO TABS: 100.0000 mg | ORAL_TABLET | Freq: Two times a day (BID) | ORAL | 0 refills | Status: DC

## 2022-07-29 NOTE — Addendum Note (Signed)
Addended by: Jannifer Rodney A on: 07/29/2022 01:30 PM   Modules accepted: Orders

## 2022-07-29 NOTE — Telephone Encounter (Signed)
Doxycyline Prescription sent to pharmacy   

## 2022-08-04 ENCOUNTER — Encounter: Payer: Self-pay | Admitting: Family Medicine

## 2022-08-04 ENCOUNTER — Other Ambulatory Visit: Payer: Self-pay

## 2022-08-04 ENCOUNTER — Encounter (HOSPITAL_COMMUNITY): Payer: Self-pay | Admitting: *Deleted

## 2022-08-04 ENCOUNTER — Emergency Department (HOSPITAL_COMMUNITY)
Admission: EM | Admit: 2022-08-04 | Discharge: 2022-08-05 | Disposition: A | Discharge status: Home / Self Care | Payer: Medicare HMO | Attending: Emergency Medicine | Admitting: Emergency Medicine

## 2022-08-04 ENCOUNTER — Ambulatory Visit (INDEPENDENT_AMBULATORY_CARE_PROVIDER_SITE_OTHER): Payer: Medicare HMO | Admitting: Family Medicine

## 2022-08-04 DIAGNOSIS — Z79899 Other long term (current) drug therapy: Secondary | ICD-10-CM | POA: Insufficient documentation

## 2022-08-04 DIAGNOSIS — R519 Headache, unspecified: Secondary | ICD-10-CM | POA: Diagnosis not present

## 2022-08-04 DIAGNOSIS — I10 Essential (primary) hypertension: Secondary | ICD-10-CM | POA: Insufficient documentation

## 2022-08-04 LAB — CBC WITH DIFFERENTIAL/PLATELET
Abs Immature Granulocytes: 0.03 10*3/uL (ref 0.00–0.07)
Basophils Absolute: 0 10*3/uL (ref 0.0–0.1)
Basophils Relative: 0 %
Eosinophils Absolute: 0.1 10*3/uL (ref 0.0–0.5)
Eosinophils Relative: 1 %
HCT: 40.8 % (ref 36.0–46.0)
Hemoglobin: 13.1 g/dL (ref 12.0–15.0)
Immature Granulocytes: 0 %
Lymphocytes Relative: 34 %
Lymphs Abs: 2.7 10*3/uL (ref 0.7–4.0)
MCH: 29.4 pg (ref 26.0–34.0)
MCHC: 32.1 g/dL (ref 30.0–36.0)
MCV: 91.5 fL (ref 80.0–100.0)
Monocytes Absolute: 0.6 10*3/uL (ref 0.1–1.0)
Monocytes Relative: 8 %
Neutro Abs: 4.3 10*3/uL (ref 1.7–7.7)
Neutrophils Relative %: 57 %
Platelets: 294 10*3/uL (ref 150–400)
RBC: 4.46 MIL/uL (ref 3.87–5.11)
RDW: 13.9 % (ref 11.5–15.5)
WBC: 7.8 10*3/uL (ref 4.0–10.5)
nRBC: 0 % (ref 0.0–0.2)

## 2022-08-04 LAB — BASIC METABOLIC PANEL
Anion gap: 8 (ref 5–15)
BUN: 11 mg/dL (ref 8–23)
CO2: 26 mmol/L (ref 22–32)
Calcium: 10.5 mg/dL — ABNORMAL HIGH (ref 8.9–10.3)
Chloride: 107 mmol/L (ref 98–111)
Creatinine, Ser: 0.57 mg/dL (ref 0.44–1.00)
GFR, Estimated: >60 mL/min (ref >60)
Glucose, Bld: 129 mg/dL — ABNORMAL HIGH (ref 70–99)
Potassium: 3.8 mmol/L (ref 3.5–5.1)
Sodium: 141 mmol/L (ref 135–145)

## 2022-08-04 LAB — HEPATIC FUNCTION PANEL
ALT: 16 U/L (ref 0–44)
AST: 22 U/L (ref 15–41)
Albumin: 3.9 g/dL (ref 3.5–5.0)
Alkaline Phosphatase: 137 U/L — ABNORMAL HIGH (ref 38–126)
Bilirubin, Direct: <0.1 mg/dL (ref 0.0–0.2)
Total Bilirubin: 0.8 mg/dL (ref 0.3–1.2)
Total Protein: 7.9 g/dL (ref 6.5–8.1)

## 2022-08-04 NOTE — ED Provider Notes (Signed)
[facility] EMERGENCY DEPARTMENT  Provider Note  CSN: [id number] Arrival date & time: 08/04/22 2038  History Chief Complaint  Patient presents with   Hypertension    JAZARIAH TEALL is a 66 y.o. female with history of HTN, reports she has had URI symptoms recently, started taking cetirizine (no decongestant) and noticed her BP was elevated to 170s at home earlier tonight. She had some headache and dizziness both of which have resolved. She called her doctor who told her to take an extra Lasix in the morning and call her back if still elevated. She is currently asymptomatic.    Home Medications Prior to Admission medications   Medication Sig Start Date End Date Taking? Authorizing Provider  benzonatate (TESSALON) 200 MG capsule Take 1 capsule (200 mg total) by mouth 3 (three) times daily as needed. 07/23/22   Sharion Balloon, [name]  doxycycline (VIBRA-TABS) 100 MG tablet Take 1 tablet (100 mg total) by mouth 2 (two) times daily. 07/29/22   Sharion Balloon, [name]  fluticasone (FLONASE) 50 MCG/ACT nasal spray Use 2 spray(s) in each nostril once daily 12/09/21   Ivy Lynn, NP  furosemide (LASIX) 40 MG tablet Take 1 tablet (40 mg total) by mouth daily. 06/22/22   Loman Brooklyn, [name]  loratadine (CLARITIN) 10 MG tablet Take 1 tablet (10 mg total) by mouth daily. 07/23/22   Sharion Balloon, [name]  lovastatin (MEVACOR) 40 MG tablet Take 1 tablet (40 mg total) by mouth at bedtime. 06/22/22   Loman Brooklyn, [name]  metoprolol (TOPROL-XL) 200 MG 24 hr tablet Take 1 tablet (200 mg total) by mouth daily. 06/22/22   Loman Brooklyn, [name]  Multiple Vitamins-Minerals (MULTIVITAMIN WITH MINERALS) tablet Take 1 tablet by mouth every other day.    [provider]  potassium chloride (KLOR-CON M) 10 MEQ tablet Take 1 tablet (10 mEq total) by mouth daily. 04/14/22   Loman Brooklyn, [name]  traZODone (DESYREL) 50 MG tablet Take 0.5-1 tablets (25-50 mg total) by mouth at bedtime as needed for sleep. 04/14/22    Loman Brooklyn, [name]     Allergies    Anesthesia s-i-40 [propofol], Codeine, and Naproxen   Review of Systems   Review of Systems Please see HPI for pertinent positives and negatives  Physical Exam BP (!) 146/81   Pulse 61   Temp 98.5 F (36.9 C) (Oral)   Resp 18   Ht 5\' 10"  (1.778 m)   SpO2 97%   BMI 44.48 kg/m   Physical Exam Vitals and nursing note reviewed.  Constitutional:      Appearance: Normal appearance.  HENT:     Head: Normocephalic and atraumatic.     Nose: Nose normal.     Mouth/Throat:     Mouth: Mucous membranes are moist.  Eyes:     Extraocular Movements: Extraocular movements intact.     Conjunctiva/sclera: Conjunctivae normal.  Cardiovascular:     Rate and Rhythm: Normal rate.  Pulmonary:     Effort: Pulmonary effort is normal.     Breath sounds: Normal breath sounds.  Abdominal:     General: Abdomen is flat.     Palpations: Abdomen is soft.     Tenderness: There is no abdominal tenderness.  Musculoskeletal:        General: No swelling. Normal range of motion.     Cervical back: Neck supple.  Skin:    General: Skin is warm and dry.  Neurological:  General: No focal deficit present.     Mental Status: She is alert.  Psychiatric:        Mood and Affect: Mood normal.     ED Results / Procedures / Treatments   EKG EKG Interpretation  Date/Time:  Wednesday August 04 2022 21:15:12 EDT Ventricular Rate:  62 PR Interval:  192 QRS Duration: 90 QT Interval:  422 QTC Calculation: 428 R Axis:   9 Text Interpretation: Normal sinus rhythm Moderate voltage criteria for LVH, may be normal variant ( R in aVL , Cornell product ) Borderline ECG When compared with ECG of 17-Mar-2018 10:56, No significant change since last tracing Confirmed by Susy Frizzle [phone]) on 08/04/2022 11:42:15 PM  Procedures Procedures  Medications Ordered in the ED Medications - No data to display  Initial Impression and Plan  Patient with elevated BP at  home has returned to normal range her without intervention. Labs done in triage show normal CBC, BMP, LFTs without signs of end organ damage. She was reassured her workup was normal and is ready to go home. Recommend close PCP follow up. Avoid any stimulant decongestants and RTED for any other concerns.   ED Course       MDM Rules/Calculators/A&P Medical Decision Making Problems Addressed: Primary hypertension: chronic illness or injury with exacerbation, progression, or side effects of treatment  Amount and/or Complexity of Data Reviewed Labs: ordered. Decision-making details documented in ED Course. ECG/medicine tests: ordered and independent interpretation performed. Decision-making details documented in ED Course.  Risk OTC drugs.    Final Clinical Impression(s) / ED Diagnoses Final diagnoses:  Primary hypertension    Rx / DC Orders ED Discharge Orders     None        Pollyann Savoy, MD 08/04/22 [phone]

## 2022-08-04 NOTE — Progress Notes (Signed)
Virtual Visit via telephone Note Due to COVID-19 pandemic this visit was conducted virtually. This visit type was conducted due to national recommendations for restrictions regarding the COVID-19 Pandemic (e.g. social distancing, sheltering in place) in an effort to limit this patient's exposure and mitigate transmission in our community. All issues noted in this document were discussed and addressed.  A physical exam was not performed with this format.   I connected with Kristine Mata on 08/04/2022 at 1605 by telephone and verified that I am speaking with the correct person using two identifiers. Kristine Mata is currently located at home and family is currently with them during visit. The provider, Kari Baars, [name] is located in their office at time of visit.  I discussed the limitations, risks, security and privacy concerns of performing an evaluation and management service by virtual visit and the availability of in person appointments. I also discussed with the patient that there may be a patient responsible charge related to this service. The patient expressed understanding and agreed to proceed.  Subjective:  Patient ID: Kristine Mata, female    DOB: [birth year], 66 y.o.   MRN: [id number]  Chief Complaint:  Hypertension   HPI: Kristine Mata is a 66 y.o. female presenting on 08/04/2022 for Hypertension   Pt reports her blood pressure has been in the 160-170/80-90 range over the last 2 days. She has had a slight headache but no other associated symptoms. Reports she is taking her medications as prescribed. No focal neurological deficits, leg swelling, chest pain, palpitations, dizziness, or syncope.   Hypertension This is a chronic problem. The problem has been waxing and waning since onset. Associated symptoms include headaches. Pertinent negatives include no anxiety, blurred vision, chest pain, malaise/fatigue, neck pain, orthopnea, palpitations, peripheral edema, PND,  shortness of breath or sweats. Past treatments include diuretics and beta blockers.     Relevant past medical, surgical, family, and social history reviewed and updated as indicated.  Allergies and medications reviewed and updated.   Past Medical History:  Diagnosis Date   Ectopic pregnancy    Heart murmur    Hyperlipidemia    Hypertension    Insomnia, unspecified    Osteopenia 03/12/2021   Right knee pain    SVT (supraventricular tachycardia) (HCC)     Past Surgical History:  Procedure Laterality Date   ABDOMINAL HYSTERECTOMY     BREAST CYST EXCISION Left    66 years old   COLONOSCOPY  2016   [facility]; 1 tubular adenoma, 1 hyperplastic polyp, TI biopsy with reactive lymphoid hyperplasia without active or chronic enteritis.   COLONOSCOPY N/A 12/02/2021   Procedure: COLONOSCOPY;  Surgeon: Corbin Ade, MD;  Location: [facility];  Service: Endoscopy;  Laterality: N/A;  7:30am   cyst left breast      SALPINGECTOMY Left    TMJ ARTHROPLASTY      Social History   Socioeconomic History   Marital status: Divorced    Spouse name: Not on file   Number of children: 1   Years of education: Not on file   Highest education level: Not on file  Occupational History   Occupation: Retired  Tobacco Use   Smoking status: Never   Smokeless tobacco: Never  Vaping Use   Vaping Use: Never used  Substance and Sexual Activity   Alcohol use: Never   Drug use: Never   Sexual activity: Not Currently    Birth control/protection: Surgical  Other Topics Concern   Not  on file  Social History Narrative   Lives alone. Has one son - lives in [place].   Social Determinants of Health   Financial Resource Strain: Low Risk  (06/14/2022)   Overall Financial Resource Strain (CARDIA)    Difficulty of Paying Living Expenses: Not hard at all  Food Insecurity: No Food Insecurity (06/14/2022)   Hunger Vital Sign    Worried About Running Out of Food in the Last Year: Never true    Ran Out of Food in  the Last Year: Never true  Transportation Needs: No Transportation Needs (06/14/2022)   PRAPARE - Administrator, Civil Service (Medical): No    Lack of Transportation (Non-Medical): No  Physical Activity: Sufficiently Active (06/14/2022)   Exercise Vital Sign    Days of Exercise per Week: 5 days    Minutes of Exercise per Session: 30 min  Stress: No Stress Concern Present (06/14/2022)   [employer] of Occupational Health - Occupational Stress Questionnaire    Feeling of Stress : Only a little  Social Connections: Moderately Isolated (06/14/2022)   Social Connection and Isolation Panel [NHANES]    Frequency of Communication with Friends and Family: More than three times a week    Frequency of Social Gatherings with Friends and Family: Three times a week    Attends Religious Services: More than 4 times per year    Active Member of Clubs or Organizations: No    Attends Banker Meetings: Never    Marital Status: Divorced  Catering manager Violence: Not At Risk (06/14/2022)   Humiliation, Afraid, Rape, and Kick questionnaire    Fear of Current or Ex-Partner: No    Emotionally Abused: No    Physically Abused: No    Sexually Abused: No    Outpatient Encounter Medications as of 08/04/2022  Medication Sig   benzonatate (TESSALON) 200 MG capsule Take 1 capsule (200 mg total) by mouth 3 (three) times daily as needed.   doxycycline (VIBRA-TABS) 100 MG tablet Take 1 tablet (100 mg total) by mouth 2 (two) times daily.   fluticasone (FLONASE) 50 MCG/ACT nasal spray Use 2 spray(s) in each nostril once daily   furosemide (LASIX) 40 MG tablet Take 1 tablet (40 mg total) by mouth daily.   loratadine (CLARITIN) 10 MG tablet Take 1 tablet (10 mg total) by mouth daily.   lovastatin (MEVACOR) 40 MG tablet Take 1 tablet (40 mg total) by mouth at bedtime.   metoprolol (TOPROL-XL) 200 MG 24 hr tablet Take 1 tablet (200 mg total) by mouth daily.   Multiple Vitamins-Minerals  (MULTIVITAMIN WITH MINERALS) tablet Take 1 tablet by mouth every other day.   potassium chloride (KLOR-CON M) 10 MEQ tablet Take 1 tablet (10 mEq total) by mouth daily.   traZODone (DESYREL) 50 MG tablet Take 0.5-1 tablets (25-50 mg total) by mouth at bedtime as needed for sleep.   No facility-administered encounter medications on file as of 08/04/2022.    Allergies  Allergen Reactions   Anesthesia S-I-40 [Propofol] Nausea And Vomiting   Codeine Nausea And Vomiting   Naproxen Palpitations    Review of Systems  Constitutional:  Negative for activity change, appetite change, chills, diaphoresis, fatigue, fever, malaise/fatigue and unexpected weight change.  Eyes:  Negative for blurred vision, photophobia and visual disturbance.  Respiratory:  Negative for cough and shortness of breath.   Cardiovascular:  Negative for chest pain, palpitations, orthopnea, leg swelling and PND.  Genitourinary:  Negative for decreased urine volume and difficulty  urinating.  Musculoskeletal:  Negative for neck pain.  Neurological:  Positive for headaches. Negative for dizziness, tremors, seizures, syncope, facial asymmetry, speech difficulty, weakness, light-headedness and numbness.  Psychiatric/Behavioral:  Negative for confusion.   All other systems reviewed and are negative.        Observations/Objective: No vital signs or physical exam, this was a virtual health encounter.  Pt alert and oriented, answers all questions appropriately, and able to speak in full sentences.    Assessment and Plan: Hanifah was seen today for hypertension.  Diagnoses and all orders for this visit:  Essential hypertension Elevated readings over last 2 days. Pt to increase lasix dosing and limit salt intake over the next two days. If BP remains elevated, she is to see me in office on Friday. Red flags discussed in detail.       Follow Up Instructions: Return if symptoms worsen or fail to improve.    I discussed the  assessment and treatment plan with the patient. The patient was provided an opportunity to ask questions and all were answered. The patient agreed with the plan and demonstrated an understanding of the instructions.   The patient was advised to call back or seek an in-person evaluation if the symptoms worsen or if the condition fails to improve as anticipated.  The above assessment and management plan was discussed with the patient. The patient verbalized understanding of and has agreed to the management plan. Patient is aware to call the clinic if they develop any new symptoms or if symptoms persist or worsen. Patient is aware when to return to the clinic for a follow-up visit. Patient educated on when it is appropriate to go to the emergency department.    I provided 12 minutes of time during this telephone encounter.   Monia Pouch, [name]-C [facility] [address] [place], [place] [address] [phone] 08/04/2022

## 2022-08-04 NOTE — ED Triage Notes (Signed)
Pt states bp elevated at home earlier and dizziness but denies at present. Had some abd pain earlier but none at present.

## 2022-08-06 ENCOUNTER — Encounter: Payer: Self-pay | Admitting: Nurse Practitioner

## 2022-08-06 ENCOUNTER — Ambulatory Visit (INDEPENDENT_AMBULATORY_CARE_PROVIDER_SITE_OTHER): Payer: Medicare HMO | Admitting: Nurse Practitioner

## 2022-08-06 VITALS — BP 142/79 | HR 64 | Temp 96.7°F | Resp 20 | Ht 70.0 in | Wt 307.0 lb

## 2022-08-06 DIAGNOSIS — I10 Essential (primary) hypertension: Secondary | ICD-10-CM

## 2022-08-06 MED ORDER — AMLODIPINE BESYLATE 2.5 MG PO TABS: 2.5000 mg | ORAL_TABLET | Freq: Every day | ORAL | 0 refills | Status: DC

## 2022-08-06 NOTE — Patient Instructions (Signed)

## 2022-08-06 NOTE — Progress Notes (Signed)
   Subjective:    Patient ID: Kristine Mata, female    DOB: Jul 10, 1956, 66 y.o.   MRN: [id number]   Chief Complaint: BP high at home and Headache   HPI Patient come sin tocay c/o elevated blood pressure and headache at home. Blood pressure readings at home have been around 169/101. She said she drank vinegar at home before coming ot office. The vinegar usually will bring her blood pressure down.She is currently on metoprolol 200mg  daily.  BP Readings from Last 3 Encounters:  08/06/22 (!) 142/79  08/04/22 138/70  06/22/22 134/71     Review of Systems  Constitutional:  Negative for diaphoresis.  Eyes:  Negative for pain.  Respiratory:  Negative for shortness of breath.   Cardiovascular:  Negative for chest pain, palpitations and leg swelling.  Gastrointestinal:  Negative for abdominal pain.  Endocrine: Negative for polydipsia.  Skin:  Negative for rash.  Neurological:  Negative for dizziness, weakness and headaches.  Hematological:  Does not bruise/bleed easily.  All other systems reviewed and are negative.      Objective:   Physical Exam Vitals reviewed.  Constitutional:      Appearance: She is well-developed. She is obese.  Cardiovascular:     Rate and Rhythm: Normal rate and regular rhythm.  Pulmonary:     Effort: Pulmonary effort is normal.     Breath sounds: Normal breath sounds.  Skin:    General: Skin is warm.  Neurological:     General: No focal deficit present.     Mental Status: She is alert and oriented to person, place, and time.  Psychiatric:        Mood and Affect: Mood normal.        Behavior: Behavior normal.     BP (!) 142/79   Pulse 64   Temp (!) 96.7 F (35.9 C) (Temporal)   Resp 20   Ht 5\' 10"  (1.778 m)   Wt (!) 307 lb (139.3 kg)   SpO2 97%   BMI 44.05 kg/m        Assessment & Plan:   Kristine Mata in today with chief complaint of BP high at home and Headache   1. Essential hypertension Dash diet Added nrovasc 2.5mg   daily Keep diary of blood pressure at home  Meds ordered this encounter  Medications   amLODipine (NORVASC) 2.5 MG tablet    Sig: Take 1 tablet (2.5 mg total) by mouth daily.    Dispense:  90 tablet    Refill:  0    Order Specific Question:   Supervising Provider    Answer:   Caryl Pina A [[id number]]       The above assessment and management plan was discussed with the patient. The patient verbalized understanding of and has agreed to the management plan. Patient is aware to call the clinic if symptoms persist or worsen. Patient is aware when to return to the clinic for a follow-up visit. Patient educated on when it is appropriate to go to the emergency department.   Mary-Margaret Hassell Done, [name]

## 2022-08-16 ENCOUNTER — Telehealth: Payer: Self-pay

## 2022-08-16 ENCOUNTER — Encounter: Payer: Self-pay | Admitting: Family Medicine

## 2022-08-16 ENCOUNTER — Ambulatory Visit (INDEPENDENT_AMBULATORY_CARE_PROVIDER_SITE_OTHER): Payer: Medicare HMO | Admitting: Family Medicine

## 2022-08-16 VITALS — BP 111/71 | HR 61 | Temp 97.3°F | Resp 20 | Ht 70.0 in | Wt 309.0 lb

## 2022-08-16 DIAGNOSIS — I10 Essential (primary) hypertension: Secondary | ICD-10-CM | POA: Diagnosis not present

## 2022-08-16 MED ORDER — LOSARTAN POTASSIUM 50 MG PO TABS: 50.0000 mg | ORAL_TABLET | Freq: Every day | ORAL | 2 refills | Status: DC

## 2022-08-16 MED ORDER — FLUTICASONE PROPIONATE 50 MCG/ACT NA SUSP: 2.0000 | Freq: Every day | NASAL | 1 refills | Status: DC

## 2022-08-16 NOTE — Telephone Encounter (Signed)
        Patient  visited [facility] on 9/20   Telephone encounter attempt :  1st  A HIPAA compliant voice message was left requesting a return call.  Instructed patient to call back    Mariangela Heldt Pop [facility] Care Guide, [place], Care Management  [phone] [address], [place], [place] [address] Phone: [phone] Email: [email or web address]       

## 2022-08-16 NOTE — Progress Notes (Signed)
Assessment & Plan:  1. Essential hypertension Blood pressure is well controlled on the current regimen, however amlodipine caused edema in ankles and feet.  Advised patient to stop taking amlodipine and start taking losartan 50 mg once daily.  Continue to monitor blood pressure, keep a log, and bring it to the next appointment. - losartan (COZAAR) 50 MG tablet; Take 1 tablet (50 mg total) by mouth daily.  Dispense: 30 tablet; Refill: 2   Follow up plan: Return in about 4 weeks (around 09/13/2022) for HTN with Je.  Deliah Boston, MSN, APRN, [name]-C [facility]  Subjective:   Patient ID: Kristine Mata, female    DOB: Jun 01, 1956, 66 y.o.   MRN: [id number]  HPI: Kristine Mata is a 66 y.o. female presenting on 08/16/2022 for Hypertension  Hypertension: Patient is here to follow-up on the addition of amlodipine 2.5 mg once daily due to uncontrolled blood pressure.  She reports she has been having swelling in her ankles and feet since starting amlodipine. Blood pressure readings at home have been great.   ROS: Negative unless specifically indicated above in HPI.   Relevant past medical history reviewed and updated as indicated.   Allergies and medications reviewed and updated.   Current Outpatient Medications:    amLODipine (NORVASC) 2.5 MG tablet, Take 1 tablet (2.5 mg total) by mouth daily., Disp: 90 tablet, Rfl: 0   fluticasone (FLONASE) 50 MCG/ACT nasal spray, Use 2 spray(s) in each nostril once daily, Disp: 48 g, Rfl: 1   furosemide (LASIX) 40 MG tablet, Take 1 tablet (40 mg total) by mouth daily., Disp: 90 tablet, Rfl: 1   loratadine (CLARITIN) 10 MG tablet, Take 1 tablet (10 mg total) by mouth daily., Disp: 90 tablet, Rfl: 1   lovastatin (MEVACOR) 40 MG tablet, Take 1 tablet (40 mg total) by mouth at bedtime., Disp: 100 tablet, Rfl: 1   metoprolol (TOPROL-XL) 200 MG 24 hr tablet, Take 1 tablet (200 mg total) by mouth daily., Disp: 90 tablet, Rfl: 1    Multiple Vitamins-Minerals (MULTIVITAMIN WITH MINERALS) tablet, Take 1 tablet by mouth every other day., Disp: , Rfl:    potassium chloride (KLOR-CON M) 10 MEQ tablet, Take 1 tablet (10 mEq total) by mouth daily., Disp: 90 tablet, Rfl: 0   traZODone (DESYREL) 50 MG tablet, Take 0.5-1 tablets (25-50 mg total) by mouth at bedtime as needed for sleep., Disp: 30 tablet, Rfl: 2  Allergies  Allergen Reactions   Anesthesia S-I-40 [Propofol] Nausea And Vomiting   Codeine Nausea And Vomiting   Naproxen Palpitations    Objective:   BP 111/71   Pulse 61   Temp (!) 97.3 F (36.3 C)   Resp 20   Ht 5\' 10"  (1.778 m)   Wt (!) 309 lb (140.2 kg)   SpO2 97%   BMI 44.34 kg/m    Physical Exam Vitals reviewed.  Constitutional:      General: She is not in acute distress.    Appearance: Normal appearance. She is morbidly obese. She is not ill-appearing, toxic-appearing or diaphoretic.  HENT:     Head: Normocephalic and atraumatic.  Eyes:     General: No scleral icterus.       Right eye: No discharge.        Left eye: No discharge.     Conjunctiva/sclera: Conjunctivae normal.  Cardiovascular:     Rate and Rhythm: Normal rate and regular rhythm.     Heart sounds: Normal heart sounds. No murmur  heard.    No friction rub. No gallop.  Pulmonary:     Effort: Pulmonary effort is normal. No respiratory distress.     Breath sounds: Normal breath sounds. No stridor. No wheezing, rhonchi or rales.  Musculoskeletal:        General: Normal range of motion.     Cervical back: Normal range of motion.     Right ankle: Swelling present.     Left ankle: Swelling present.     Right foot: Swelling present.     Left foot: Swelling present.  Skin:    General: Skin is warm and dry.     Capillary Refill: Capillary refill takes less than 2 seconds.  Neurological:     General: No focal deficit present.     Mental Status: She is alert and oriented to person, place, and time. Mental status is at baseline.   Psychiatric:        Mood and Affect: Mood normal.        Behavior: Behavior normal.        Thought Content: Thought content normal.        Judgment: Judgment normal.

## 2022-08-16 NOTE — Patient Instructions (Signed)
Stop amlodipine  Start losartan

## 2022-08-25 ENCOUNTER — Ambulatory Visit: Payer: Medicare HMO

## 2022-09-01 ENCOUNTER — Telehealth: Payer: Self-pay | Admitting: Family Medicine

## 2022-09-01 NOTE — Telephone Encounter (Signed)
Pt called to let PCP know that she was recently put on Losartan to try. Pt says she cant keep taking it because it gives her headaches, dizziness, and makes her left foot swell.   Please advise and call patient.

## 2022-09-01 NOTE — Telephone Encounter (Signed)
Appointment scheduled.

## 2022-09-02 ENCOUNTER — Ambulatory Visit (INDEPENDENT_AMBULATORY_CARE_PROVIDER_SITE_OTHER): Payer: Medicare HMO | Admitting: Family Medicine

## 2022-09-02 ENCOUNTER — Encounter: Payer: Self-pay | Admitting: Family Medicine

## 2022-09-02 VITALS — BP 132/72 | HR 69 | Temp 97.3°F | Ht 70.0 in | Wt 313.0 lb

## 2022-09-02 DIAGNOSIS — R7989 Other specified abnormal findings of blood chemistry: Secondary | ICD-10-CM | POA: Diagnosis not present

## 2022-09-02 DIAGNOSIS — I10 Essential (primary) hypertension: Secondary | ICD-10-CM | POA: Diagnosis not present

## 2022-09-02 MED ORDER — OLMESARTAN MEDOXOMIL 20 MG PO TABS: 20.0000 mg | ORAL_TABLET | Freq: Every day | ORAL | 4 refills | Status: DC

## 2022-09-02 NOTE — Patient Instructions (Signed)

## 2022-09-02 NOTE — Progress Notes (Signed)
Subjective:  Patient ID: Kristine Mata, female    DOB: [birth year], 66 y.o.   MRN: [id number]  Patient Care Team: Baruch Gouty, [name] as PCP - General (Family Medicine) Gala Romney Cristopher Estimable, MD as Consulting Physician (Gastroenterology)   Chief Complaint:  Establish Care [name] patient ) and Medication Problem (Patient states that with the losartan she has been having left ankle swelling, dizziness and headaches. )   HPI: Kristine Mata is a 66 y.o. female presenting on 09/02/2022 for Establish Care [name] patient ) and Medication Problem (Patient states that with the losartan she has been having left ankle swelling, dizziness and headaches. )   1. Essential hypertension Pt presents today to discuss blood pressure medications. She was started on amlodipine and was unable to tolerate due to leg swelling, this was switched to losartan. Pt states since starting losartan she has had increased leg swelling and dizziness. States she monitors her salt intake and wears compression stockings. No calk pain, shortness of breath, orthopnea, PND, palpitations, or chest pain. No headaches, weakness, or confusion. She was unable to tolerate lisinopril in the past due to cough.      Relevant past medical, surgical, family, and social history reviewed and updated as indicated.  Allergies and medications reviewed and updated. Data reviewed: Chart in Epic.   Past Medical History:  Diagnosis Date   Ectopic pregnancy    Heart murmur    Hyperlipidemia    Hypertension    Insomnia, unspecified    Osteopenia 03/12/2021   Right knee pain    SVT (supraventricular tachycardia)     Past Surgical History:  Procedure Laterality Date   ABDOMINAL HYSTERECTOMY     BREAST CYST EXCISION Left    66 years old   COLONOSCOPY  2016   [facility]; 1 tubular adenoma, 1 hyperplastic polyp, TI biopsy with reactive lymphoid hyperplasia without active or chronic enteritis.   COLONOSCOPY N/A 12/02/2021   Procedure:  COLONOSCOPY;  Surgeon: Daneil Dolin, MD;  Location: [facility];  Service: Endoscopy;  Laterality: N/A;  7:30am   cyst left breast      SALPINGECTOMY Left    TMJ ARTHROPLASTY      Social History   Socioeconomic History   Marital status: Divorced    Spouse name: Not on file   Number of children: 1   Years of education: Not on file   Highest education level: Not on file  Occupational History   Occupation: Retired  Tobacco Use   Smoking status: Never   Smokeless tobacco: Never  Vaping Use   Vaping Use: Never used  Substance and Sexual Activity   Alcohol use: Never   Drug use: Never   Sexual activity: Not Currently    Birth control/protection: Surgical  Other Topics Concern   Not on file  Social History Narrative   Lives alone. Has one son - lives in [place].   Social Determinants of Health   Financial Resource Strain: Low Risk  (06/14/2022)   Overall Financial Resource Strain (CARDIA)    Difficulty of Paying Living Expenses: Not hard at all  Food Insecurity: No Food Insecurity (06/14/2022)   Hunger Vital Sign    Worried About Running Out of Food in the Last Year: Never true    Ran Out of Food in the Last Year: Never true  Transportation Needs: No Transportation Needs (06/14/2022)   PRAPARE - Hydrologist (Medical): No    Lack of Transportation (  Non-Medical): No  Physical Activity: Sufficiently Active (06/14/2022)   Exercise Vital Sign    Days of Exercise per Week: 5 days    Minutes of Exercise per Session: 30 min  Stress: No Stress Concern Present (06/14/2022)   [place]    Feeling of Stress : Only a little  Social Connections: Moderately Isolated (06/14/2022)   Social Connection and Isolation Panel [NHANES]    Frequency of Communication with Friends and Family: More than three times a week    Frequency of Social Gatherings with Friends and Family: Three times a week     Attends Religious Services: More than 4 times per year    Active Member of Clubs or Organizations: No    Attends Archivist Meetings: Never    Marital Status: Divorced  Human resources officer Violence: Not At Risk (06/14/2022)   Humiliation, Afraid, Rape, and Kick questionnaire    Fear of Current or Ex-Partner: No    Emotionally Abused: No    Physically Abused: No    Sexually Abused: No    Outpatient Encounter Medications as of 09/02/2022  Medication Sig   fluticasone (FLONASE) 50 MCG/ACT nasal spray Place 2 sprays into both nostrils daily.   furosemide (LASIX) 40 MG tablet Take 1 tablet (40 mg total) by mouth daily.   loratadine (CLARITIN) 10 MG tablet Take 1 tablet (10 mg total) by mouth daily.   lovastatin (MEVACOR) 40 MG tablet Take 1 tablet (40 mg total) by mouth at bedtime.   metoprolol (TOPROL-XL) 200 MG 24 hr tablet Take 1 tablet (200 mg total) by mouth daily.   Multiple Vitamins-Minerals (MULTIVITAMIN WITH MINERALS) tablet Take 1 tablet by mouth every other day.   olmesartan (BENICAR) 20 MG tablet Take 1 tablet (20 mg total) by mouth daily.   traZODone (DESYREL) 50 MG tablet Take 0.5-1 tablets (25-50 mg total) by mouth at bedtime as needed for sleep.   [DISCONTINUED] losartan (COZAAR) 50 MG tablet Take 1 tablet (50 mg total) by mouth daily.   potassium chloride (KLOR-CON M) 10 MEQ tablet Take 1 tablet (10 mEq total) by mouth daily. (Patient not taking: Reported on 09/02/2022)   No facility-administered encounter medications on file as of 09/02/2022.    Allergies  Allergen Reactions   Anesthesia S-I-40 [Propofol] Nausea And Vomiting   Codeine Nausea And Vomiting   Amlodipine Other (See Comments)    Edema at 2.5 mg dosage.   Naproxen Palpitations    Review of Systems  Constitutional:  Negative for activity change, appetite change, chills, diaphoresis, fatigue, fever and unexpected weight change.  HENT: Negative.    Eyes: Negative.  Negative for photophobia and visual  disturbance.  Respiratory:  Negative for cough, chest tightness and shortness of breath.   Cardiovascular:  Positive for leg swelling. Negative for chest pain and palpitations.  Gastrointestinal:  Negative for blood in stool, constipation, diarrhea, nausea and vomiting.  Endocrine: Negative.   Genitourinary:  Negative for decreased urine volume, difficulty urinating, dysuria, frequency and urgency.  Musculoskeletal:  Negative for arthralgias and myalgias.  Skin: Negative.   Allergic/Immunologic: Negative.   Neurological:  Positive for dizziness. Negative for headaches.  Hematological: Negative.   Psychiatric/Behavioral:  Negative for confusion, hallucinations, sleep disturbance and suicidal ideas.   All other systems reviewed and are negative.       Objective:  BP 132/72   Pulse 69   Temp (!) 97.3 F (36.3 C) (Temporal)   Ht _0  (1.778  m)   Wt (!) 313 lb (142 kg)   SpO2 96%   BMI 44.91 kg/m    Wt Readings from Last 3 Encounters:  09/02/22 (!) 313 lb (142 kg)  08/16/22 (!) 309 lb (140.2 kg)  08/06/22 (!) 307 lb (139.3 kg)    Physical Exam Vitals and nursing note reviewed.  Constitutional:      General: She is not in acute distress.    Appearance: Normal appearance. She is well-developed and well-groomed. She is not ill-appearing, toxic-appearing or diaphoretic.  HENT:     Head: Normocephalic and atraumatic.     Jaw: There is normal jaw occlusion.     Right Ear: Hearing normal.     Left Ear: Hearing normal.     Nose: Nose normal.     Mouth/Throat:     Lips: Pink.     Mouth: Mucous membranes are moist.     Pharynx: Uvula midline.  Eyes:     General: Lids are normal.     Conjunctiva/sclera: Conjunctivae normal.     Pupils: Pupils are equal, round, and reactive to light.  Neck:     Thyroid: No thyroid mass, thyromegaly or thyroid tenderness.     Vascular: No carotid bruit or JVD.     Trachea: Trachea and phonation normal.  Cardiovascular:     Rate and Rhythm:  Normal rate and regular rhythm.     Chest Wall: PMI is not displaced.     Pulses: Normal pulses.     Heart sounds: Normal heart sounds. No murmur heard.    No friction rub. No gallop.  Pulmonary:     Effort: Pulmonary effort is normal. No respiratory distress.     Breath sounds: Normal breath sounds. No wheezing.  Abdominal:     General: There is no abdominal bruit.     Palpations: There is no hepatomegaly or splenomegaly.  Musculoskeletal:        General: Normal range of motion.     Cervical back: Normal range of motion and neck supple.     Right lower leg: 1+ Edema present.     Left lower leg: 1+ Edema present.  Lymphadenopathy:     Cervical: No cervical adenopathy.  Skin:    General: Skin is warm and dry.     Capillary Refill: Capillary refill takes less than 2 seconds.     Coloration: Skin is not cyanotic, jaundiced or pale.     Findings: No rash.  Neurological:     General: No focal deficit present.     Mental Status: She is alert and oriented to person, place, and time.     Sensory: Sensation is intact.     Motor: Motor function is intact.     Coordination: Coordination is intact.     Gait: Gait is intact.     Deep Tendon Reflexes: Reflexes are normal and symmetric.  Psychiatric:        Attention and Perception: Attention and perception normal.        Mood and Affect: Mood and affect normal.        Speech: Speech normal.        Behavior: Behavior normal. Behavior is cooperative.        Thought Content: Thought content normal.        Cognition and Memory: Cognition and memory normal.        Judgment: Judgment normal.     Results for orders placed or performed during the hospital encounter of 08/04/22  CBC with Differential  Result Value Ref Range   WBC 7.8 4.0 - 10.5 K/uL   RBC 4.46 3.87 - 5.11 MIL/uL   Hemoglobin 13.1 12.0 - 15.0 g/dL   HCT 40.8 36.0 - 46.0 %   MCV 91.5 80.0 - 100.0 fL   MCH 29.4 26.0 - 34.0 pg   MCHC 32.1 30.0 - 36.0 g/dL   RDW 13.9 11.5 -  15.5 %   Platelets 294 150 - 400 K/uL   nRBC 0.0 0.0 - 0.2 %   Neutrophils Relative % 57 %   Neutro Abs 4.3 1.7 - 7.7 K/uL   Lymphocytes Relative 34 %   Lymphs Abs 2.7 0.7 - 4.0 K/uL   Monocytes Relative 8 %   Monocytes Absolute 0.6 0.1 - 1.0 K/uL   Eosinophils Relative 1 %   Eosinophils Absolute 0.1 0.0 - 0.5 K/uL   Basophils Relative 0 %   Basophils Absolute 0.0 0.0 - 0.1 K/uL   Immature Granulocytes 0 %   Abs Immature Granulocytes 0.03 0.00 - 0.07 K/uL  Basic metabolic panel  Result Value Ref Range   Sodium 141 135 - 145 mmol/L   Potassium 3.8 3.5 - 5.1 mmol/L   Chloride 107 98 - 111 mmol/L   CO2 26 22 - 32 mmol/L   Glucose, Bld 129 (H) 70 - 99 mg/dL   BUN 11 8 - 23 mg/dL   Creatinine, Ser 0.57 0.44 - 1.00 mg/dL   Calcium 10.5 (H) 8.9 - 10.3 mg/dL   GFR, Estimated >60 >60 mL/min   Anion gap 8 5 - 15  Hepatic function panel  Result Value Ref Range   Total Protein 7.9 6.5 - 8.1 g/dL   Albumin 3.9 3.5 - 5.0 g/dL   AST 22 15 - 41 U/L   ALT 16 0 - 44 U/L   Alkaline Phosphatase 137 (H) 38 - 126 U/L   Total Bilirubin 0.8 0.3 - 1.2 mg/dL   Bilirubin, Direct <0.1 0.0 - 0.2 mg/dL   Indirect Bilirubin NOT CALCULATED 0.3 - 0.9 mg/dL       Pertinent labs & imaging results that were available during my care of the patient were reviewed by me and considered in my medical decision making.  Assessment & Plan:  Mykayla was seen today for establish care and medication problem.  Diagnoses and all orders for this visit:  Essential hypertension Unable to tolerate losartan due to leg swelling and dizziness. Will change to olmesartan to see if beneficial. DASH diet and exercise discussed in detail. Report adverse reactions to medications. Follow up in office in 6 weeks for reevaluation and repeat labs.  -     olmesartan (BENICAR) 20 MG tablet; Take 1 tablet (20 mg total) by mouth daily. -     BMP8+EGFR -     Thyroid Panel With TSH  Low TSH level Will repeat labs today, further  treatment pending results.   Continue all other maintenance medications.  Follow up plan: Return in about 6 weeks (around 10/14/2022), or if symptoms worsen or fail to improve, for HTN.   Continue healthy lifestyle choices, including diet (rich in fruits, vegetables, and lean proteins, and low in salt and simple carbohydrates) and exercise (at least 30 minutes of moderate physical activity daily).  Educational handout given for DASH diet, HTN  The above assessment and management plan was discussed with the patient. The patient verbalized understanding of and has agreed to the management plan. Patient is aware to call the clinic  if they develop any new symptoms or if symptoms persist or worsen. Patient is aware when to return to the clinic for a follow-up visit. Patient educated on when it is appropriate to go to the emergency department.   Monia Pouch, [name]-C [facility] [phone]

## 2022-09-03 LAB — THYROID PANEL WITH TSH
Free Thyroxine Index: 1.5 (ref 1.2–4.9)
T3 Uptake Ratio: 26 % (ref 24–39)
T4, Total: 5.9 ug/dL (ref 4.5–12.0)
TSH: 0.624 u[IU]/mL (ref 0.450–4.500)

## 2022-09-03 LAB — BMP8+EGFR
BUN/Creatinine Ratio: 20 (ref 12–28)
BUN: 13 mg/dL (ref 8–27)
CO2: 23 mmol/L (ref 20–29)
Calcium: 10.7 mg/dL — ABNORMAL HIGH (ref 8.7–10.3)
Chloride: 103 mmol/L (ref 96–106)
Creatinine, Ser: 0.66 mg/dL (ref 0.57–1.00)
Glucose: 107 mg/dL — ABNORMAL HIGH (ref 70–99)
Potassium: 4.9 mmol/L (ref 3.5–5.2)
Sodium: 142 mmol/L (ref 134–144)
eGFR: 96 mL/min/{1.73_m2} (ref >59)

## 2022-09-03 NOTE — Addendum Note (Signed)
Addended by: Baruch Gouty on: 09/03/2022 11:05 AM   Modules accepted: Orders

## 2022-09-07 NOTE — Telephone Encounter (Signed)
Pt says that she cannot take olmesartan (BENICAR) 20 MG tablet due to side effects --diarrhea dizzy and headaches. She said she will not take the rx today. Please call back

## 2022-09-07 NOTE — Telephone Encounter (Signed)
Pt has been informed and understood. She will call back if symptoms persist.

## 2022-09-09 ENCOUNTER — Ambulatory Visit
Admission: RE | Admit: 2022-09-09 | Discharge: 2022-09-09 | Disposition: A | Discharge status: Home / Self Care | Payer: Medicare HMO | Source: Ambulatory Visit | Attending: Family Medicine | Admitting: Family Medicine

## 2022-09-09 ENCOUNTER — Telehealth: Payer: Self-pay | Admitting: Family Medicine

## 2022-09-09 DIAGNOSIS — Z1231 Encounter for screening mammogram for malignant neoplasm of breast: Secondary | ICD-10-CM | POA: Diagnosis not present

## 2022-09-10 NOTE — Telephone Encounter (Signed)
Pt will need to be seen by Sharyn Lull since she has not seen the pt for a reason to place a referral for sleep study.  Left message informing pt of this and to call back for an appointment.

## 2022-09-13 ENCOUNTER — Ambulatory Visit: Payer: Medicare HMO | Admitting: Internal Medicine

## 2022-09-23 ENCOUNTER — Other Ambulatory Visit: Payer: Self-pay | Admitting: *Deleted

## 2022-09-23 DIAGNOSIS — I10 Essential (primary) hypertension: Secondary | ICD-10-CM

## 2022-09-23 MED ORDER — OLMESARTAN MEDOXOMIL 20 MG PO TABS: 20.0000 mg | ORAL_TABLET | Freq: Every day | ORAL | 0 refills | Status: DC

## 2022-09-29 ENCOUNTER — Ambulatory Visit: Payer: Medicare HMO | Admitting: Internal Medicine

## 2022-10-05 ENCOUNTER — Ambulatory Visit: Payer: Medicare HMO | Admitting: Internal Medicine

## 2022-10-14 ENCOUNTER — Encounter: Payer: Self-pay | Admitting: Family Medicine

## 2022-10-14 ENCOUNTER — Ambulatory Visit (INDEPENDENT_AMBULATORY_CARE_PROVIDER_SITE_OTHER): Payer: Medicare HMO | Admitting: Family Medicine

## 2022-10-14 VITALS — BP 125/84 | HR 65 | Temp 97.5°F | Ht 70.0 in | Wt 313.8 lb

## 2022-10-14 DIAGNOSIS — R7989 Other specified abnormal findings of blood chemistry: Secondary | ICD-10-CM | POA: Diagnosis not present

## 2022-10-14 DIAGNOSIS — I10 Essential (primary) hypertension: Secondary | ICD-10-CM

## 2022-10-14 DIAGNOSIS — E782 Mixed hyperlipidemia: Secondary | ICD-10-CM

## 2022-10-14 MED ORDER — FUROSEMIDE 80 MG PO TABS: 80.0000 mg | ORAL_TABLET | Freq: Every day | ORAL | 4 refills | Status: DC

## 2022-10-14 NOTE — Progress Notes (Signed)
Subjective:  Patient ID: Kristine Mata, female    DOB: [birth year], 66 y.o.   MRN: [id number]  Patient Care Team: Baruch Gouty, [name] as PCP - General (Family Medicine) Gala Romney Cristopher Estimable, MD as Consulting Physician (Gastroenterology)   Chief Complaint:  Hypertension (6 week follow up )   HPI: Kristine Mata is a 66 y.o. female presenting on 10/14/2022 for Hypertension (6 week follow up )   1. Essential hypertension Has been having significant side effects with olmesartan with dizziness, light headedness, and leg swelling. Cut the dose in half but still symptomatic. Does not wish to continue if able. No chest pain or shortness of breath reported.    2. Low TSH level 3. Serum calcium elevated Has not completed follow up labs with PTH and ionized calcium, will order these today.   4. Morbid obesity ([place]) Does not follow a healthy diet or exercise routine. Looking in to starting silver sneakers, going to check on this today.   5. Mixed hyperlipidemia On statin therapy and tolerating well. No myalgias. Does not follow a diet or exercise routine.       Relevant past medical, surgical, family, and social history reviewed and updated as indicated.  Allergies and medications reviewed and updated. Data reviewed: Chart in Epic.   Past Medical History:  Diagnosis Date   Ectopic pregnancy    Heart murmur    Hyperlipidemia    Hypertension    Insomnia, unspecified    Osteopenia 03/12/2021   Right knee pain    SVT (supraventricular tachycardia)     Past Surgical History:  Procedure Laterality Date   ABDOMINAL HYSTERECTOMY     BREAST CYST EXCISION Left    66 years old   COLONOSCOPY  2016   [facility]; 1 tubular adenoma, 1 hyperplastic polyp, TI biopsy with reactive lymphoid hyperplasia without active or chronic enteritis.   COLONOSCOPY N/A 12/02/2021   Procedure: COLONOSCOPY;  Surgeon: Daneil Dolin, MD;  Location: [facility];  Service: Endoscopy;  Laterality: N/A;  7:30am    cyst left breast      SALPINGECTOMY Left    TMJ ARTHROPLASTY      Social History   Socioeconomic History   Marital status: Divorced    Spouse name: Not on file   Number of children: 1   Years of education: Not on file   Highest education level: Not on file  Occupational History   Occupation: Retired  Tobacco Use   Smoking status: Never   Smokeless tobacco: Never  Vaping Use   Vaping Use: Never used  Substance and Sexual Activity   Alcohol use: Never   Drug use: Never   Sexual activity: Not Currently    Birth control/protection: Surgical  Other Topics Concern   Not on file  Social History Narrative   Lives alone. Has one son - lives in [place].   Social Determinants of Health   Financial Resource Strain: Low Risk  (06/14/2022)   Overall Financial Resource Strain (CARDIA)    Difficulty of Paying Living Expenses: Not hard at all  Food Insecurity: No Food Insecurity (06/14/2022)   Hunger Vital Sign    Worried About Running Out of Food in the Last Year: Never true    Ran Out of Food in the Last Year: Never true  Transportation Needs: No Transportation Needs (06/14/2022)   PRAPARE - Hydrologist (Medical): No    Lack of Transportation (Non-Medical): No  Physical  Activity: Sufficiently Active (06/14/2022)   Exercise Vital Sign    Days of Exercise per Week: 5 days    Minutes of Exercise per Session: 30 min  Stress: No Stress Concern Present (06/14/2022)   [place]    Feeling of Stress : Only a little  Social Connections: Moderately Isolated (06/14/2022)   Social Connection and Isolation Panel [NHANES]    Frequency of Communication with Friends and Family: More than three times a week    Frequency of Social Gatherings with Friends and Family: Three times a week    Attends Religious Services: More than 4 times per year    Active Member of Clubs or Organizations: No    Attends English as a second language teacher Meetings: Never    Marital Status: Divorced  Human resources officer Violence: Not At Risk (06/14/2022)   Humiliation, Afraid, Rape, and Kick questionnaire    Fear of Current or Ex-Partner: No    Emotionally Abused: No    Physically Abused: No    Sexually Abused: No    Outpatient Encounter Medications as of 10/14/2022  Medication Sig   fluticasone (FLONASE) 50 MCG/ACT nasal spray Place 2 sprays into both nostrils daily.   furosemide (LASIX) 80 MG tablet Take 1 tablet (80 mg total) by mouth daily.   loratadine (CLARITIN) 10 MG tablet Take 1 tablet (10 mg total) by mouth daily.   lovastatin (MEVACOR) 40 MG tablet Take 1 tablet (40 mg total) by mouth at bedtime.   metoprolol (TOPROL-XL) 200 MG 24 hr tablet Take 1 tablet (200 mg total) by mouth daily.   Multiple Vitamins-Minerals (MULTIVITAMIN WITH MINERALS) tablet Take 1 tablet by mouth every other day.   traZODone (DESYREL) 50 MG tablet Take 0.5-1 tablets (25-50 mg total) by mouth at bedtime as needed for sleep.   [DISCONTINUED] furosemide (LASIX) 40 MG tablet Take 1 tablet (40 mg total) by mouth daily.   [DISCONTINUED] olmesartan (BENICAR) 20 MG tablet Take 1 tablet (20 mg total) by mouth daily. (Patient taking differently: Take 10 mg by mouth daily.)   [DISCONTINUED] potassium chloride (KLOR-CON M) 10 MEQ tablet Take 1 tablet (10 mEq total) by mouth daily.   No facility-administered encounter medications on file as of 10/14/2022.    Allergies  Allergen Reactions   Anesthesia S-I-40 [Propofol] Nausea And Vomiting   Codeine Nausea And Vomiting   Amlodipine Other (See Comments)    Edema at 2.5 mg dosage.   Naproxen Palpitations    Review of Systems  Constitutional:  Negative for activity change, appetite change, chills, fatigue and fever.  HENT: Negative.    Eyes: Negative.   Respiratory:  Negative for cough, chest tightness and shortness of breath.   Cardiovascular:  Positive for leg swelling. Negative for chest pain and  palpitations.  Gastrointestinal:  Negative for abdominal pain, blood in stool, constipation, diarrhea, nausea and vomiting.  Endocrine: Negative.   Genitourinary:  Negative for decreased urine volume, difficulty urinating, dysuria, frequency and urgency.  Musculoskeletal:  Negative for arthralgias and myalgias.  Skin: Negative.   Allergic/Immunologic: Negative.   Neurological:  Positive for dizziness and light-headedness. Negative for tremors, seizures, syncope, facial asymmetry, speech difficulty, weakness, numbness and headaches.  Hematological: Negative.   Psychiatric/Behavioral:  Negative for confusion, hallucinations, sleep disturbance and suicidal ideas.   All other systems reviewed and are negative.       Objective:  BP 125/84   Pulse 65   Temp (!) 97.5 F (36.4 C) (Temporal)  Ht _0  (1.778 m)   Wt (!) 313 lb 12.8 oz (142.3 kg)   SpO2 97%   BMI 45.03 kg/m    Wt Readings from Last 3 Encounters:  10/14/22 (!) 313 lb 12.8 oz (142.3 kg)  09/02/22 (!) 313 lb (142 kg)  08/16/22 (!) 309 lb (140.2 kg)    Physical Exam Vitals and nursing note reviewed.  Constitutional:      General: She is not in acute distress.    Appearance: Normal appearance. She is well-developed and well-groomed. She is morbidly obese. She is not ill-appearing, toxic-appearing or diaphoretic.  HENT:     Head: Normocephalic and atraumatic.     Jaw: There is normal jaw occlusion.     Right Ear: Hearing normal.     Left Ear: Hearing normal.     Nose: Nose normal.     Mouth/Throat:     Lips: Pink.     Mouth: Mucous membranes are moist.     Pharynx: Oropharynx is clear. Uvula midline.  Eyes:     General: Lids are normal.     Extraocular Movements: Extraocular movements intact.     Conjunctiva/sclera: Conjunctivae normal.     Pupils: Pupils are equal, round, and reactive to light.  Neck:     Thyroid: No thyroid mass, thyromegaly or thyroid tenderness.     Vascular: No carotid bruit or JVD.      Trachea: Trachea and phonation normal.  Cardiovascular:     Rate and Rhythm: Normal rate and regular rhythm.     Chest Wall: PMI is not displaced.     Pulses: Normal pulses.     Heart sounds: Normal heart sounds. No murmur heard.    No friction rub. No gallop.  Pulmonary:     Effort: Pulmonary effort is normal. No respiratory distress.     Breath sounds: Normal breath sounds. No wheezing.  Abdominal:     General: Bowel sounds are normal. There is no distension or abdominal bruit.     Palpations: Abdomen is soft. There is no hepatomegaly or splenomegaly.     Tenderness: There is no abdominal tenderness. There is no right CVA tenderness or left CVA tenderness.     Hernia: No hernia is present.  Musculoskeletal:     Cervical back: Normal range of motion and neck supple.     Right upper leg: Normal.     Left upper leg: Normal.     Right knee: Swelling present. No deformity, effusion, erythema, ecchymosis, lacerations, bony tenderness or crepitus. Decreased range of motion. Tenderness present. No LCL laxity, MCL laxity, ACL laxity or PCL laxity. Normal alignment, normal meniscus and normal patellar mobility. Normal pulse.     Instability Tests: Anterior drawer test negative. Posterior drawer test negative. Anterior Lachman test negative. Medial McMurray test negative and lateral McMurray test negative.     Left knee: No swelling, deformity, effusion, erythema, ecchymosis, lacerations, bony tenderness or crepitus. Normal range of motion. Tenderness present. No LCL laxity, MCL laxity, ACL laxity or PCL laxity.Normal alignment, normal meniscus and normal patellar mobility. Normal pulse.     Instability Tests: Anterior drawer test negative. Posterior drawer test negative. Anterior Lachman test negative. Medial McMurray test negative and lateral McMurray test negative.     Right lower leg: 1+ Edema present.     Left lower leg: 1+ Edema present.     Right ankle: Swelling present. Normal range of  motion.     Right Achilles Tendon: Normal.  Left ankle: Swelling present. Normal range of motion.     Left Achilles Tendon: Normal.  Lymphadenopathy:     Cervical: No cervical adenopathy.  Skin:    General: Skin is warm and dry.     Capillary Refill: Capillary refill takes less than 2 seconds.     Coloration: Skin is not cyanotic, jaundiced or pale.     Findings: No rash.  Neurological:     General: No focal deficit present.     Mental Status: She is alert and oriented to person, place, and time.     Sensory: Sensation is intact.     Motor: Motor function is intact.     Coordination: Coordination is intact.     Gait: Gait is intact.     Deep Tendon Reflexes: Reflexes are normal and symmetric.  Psychiatric:        Attention and Perception: Attention and perception normal.        Mood and Affect: Mood and affect normal.        Speech: Speech normal.        Behavior: Behavior normal. Behavior is cooperative.        Thought Content: Thought content normal.        Cognition and Memory: Cognition and memory normal.        Judgment: Judgment normal.     Results for orders placed or performed in visit on 09/02/22  BMP8+EGFR  Result Value Ref Range   Glucose 107 (H) 70 - 99 mg/dL   BUN 13 8 - 27 mg/dL   Creatinine, Ser 0.66 0.57 - 1.00 mg/dL   eGFR 96 >59 mL/min/1.73   BUN/Creatinine Ratio 20 12 - 28   Sodium 142 134 - 144 mmol/L   Potassium 4.9 3.5 - 5.2 mmol/L   Chloride 103 96 - 106 mmol/L   CO2 23 20 - 29 mmol/L   Calcium 10.7 (H) 8.7 - 10.3 mg/dL  Thyroid Panel With TSH  Result Value Ref Range   TSH 0.624 0.450 - 4.500 uIU/mL   T4, Total 5.9 4.5 - 12.0 ug/dL   T3 Uptake Ratio 26 24 - 39 %   Free Thyroxine Index 1.5 1.2 - 4.9       Pertinent labs & imaging results that were available during my care of the patient were reviewed by me and considered in my medical decision making.  Assessment & Plan:  Xolani was seen today for hypertension.  Diagnoses and all  orders for this visit:  Essential hypertension Symptomatic with olmesartan, will discontinue and increase lasix dosing. Other labs pending. DASH diet and exercise encouraged. Follow up in 6 weeks for reevaluation and repeat labs.  -     furosemide (LASIX) 80 MG tablet; Take 1 tablet (80 mg total) by mouth daily. -     CBC with Differential/Platelet -     CMP14+EGFR -     Thyroid Panel With TSH -     Lipid panel  Low TSH level Serum calcium elevated Will repeat labs in addition to PTH and ionized calcium. Further treatment pending results.  -     CMP14+EGFR -     Parathyroid hormone, intact (no Ca) -     Calcium, ionized  Morbid obesity (HCC) Diet and exercise encouraged. Education and handout provided. Labs pending. Follow up in 6 weeks for reevaluation.  -     CBC with Differential/Platelet -     CMP14+EGFR -     Thyroid Panel With TSH -  Lipid panel  Mixed hyperlipidemia Diet encouraged - increase intake of fresh fruits and vegetables, increase intake of lean proteins. Bake, broil, or grill foods. Avoid fried, greasy, and fatty foods. Avoid fast foods. Increase intake of fiber-rich whole grains. Exercise encouraged - at least 150 minutes per week and advance as tolerated. Goal BMI < 25. Continue medications as prescribed. Follow up in 3-6 months as discussed.  -     CMP14+EGFR -     Lipid panel     Continue all other maintenance medications.  Follow up plan: Return in about 6 weeks (around 11/25/2022), or if symptoms worsen or fail to improve, for HTN, BMP.   Continue healthy lifestyle choices, including diet (rich in fruits, vegetables, and lean proteins, and low in salt and simple carbohydrates) and exercise (at least 30 minutes of moderate physical activity daily).  Educational handout given for calorie counting for weight management  The above assessment and management plan was discussed with the patient. The patient verbalized understanding of and has agreed to the  management plan. Patient is aware to call the clinic if they develop any new symptoms or if symptoms persist or worsen. Patient is aware when to return to the clinic for a follow-up visit. Patient educated on when it is appropriate to go to the emergency department.   Monia Pouch, [name]-C [facility] [phone]

## 2022-10-15 LAB — PARATHYROID HORMONE, INTACT (NO CA): PTH: 131 pg/mL — ABNORMAL HIGH (ref 15–65)

## 2022-10-15 LAB — CMP14+EGFR
ALT: 13 IU/L (ref 0–32)
AST: 20 IU/L (ref 0–40)
Albumin/Globulin Ratio: 1.5 (ref 1.2–2.2)
Albumin: 4.2 g/dL (ref 3.9–4.9)
Alkaline Phosphatase: 171 IU/L — ABNORMAL HIGH (ref 44–121)
BUN/Creatinine Ratio: 14 (ref 12–28)
BUN: 9 mg/dL (ref 8–27)
Bilirubin Total: 0.3 mg/dL (ref 0.0–1.2)
CO2: 26 mmol/L (ref 20–29)
Calcium: 10.4 mg/dL — ABNORMAL HIGH (ref 8.7–10.3)
Chloride: 102 mmol/L (ref 96–106)
Creatinine, Ser: 0.65 mg/dL (ref 0.57–1.00)
Globulin, Total: 2.8 g/dL (ref 1.5–4.5)
Glucose: 110 mg/dL — ABNORMAL HIGH (ref 70–99)
Potassium: 4.2 mmol/L (ref 3.5–5.2)
Sodium: 141 mmol/L (ref 134–144)
Total Protein: 7 g/dL (ref 6.0–8.5)
eGFR: 96 mL/min/{1.73_m2} (ref >59)

## 2022-10-15 LAB — CBC WITH DIFFERENTIAL/PLATELET
Basophils Absolute: 0 10*3/uL (ref 0.0–0.2)
Basos: 1 %
EOS (ABSOLUTE): 0.1 10*3/uL (ref 0.0–0.4)
Eos: 1 %
Hematocrit: 38.1 % (ref 34.0–46.6)
Hemoglobin: 12.6 g/dL (ref 11.1–15.9)
Immature Grans (Abs): 0 10*3/uL (ref 0.0–0.1)
Immature Granulocytes: 0 %
Lymphocytes Absolute: 2.2 10*3/uL (ref 0.7–3.1)
Lymphs: 44 %
MCH: 29.4 pg (ref 26.6–33.0)
MCHC: 33.1 g/dL (ref 31.5–35.7)
MCV: 89 fL (ref 79–97)
Monocytes Absolute: 0.4 10*3/uL (ref 0.1–0.9)
Monocytes: 9 %
Neutrophils Absolute: 2.2 10*3/uL (ref 1.4–7.0)
Neutrophils: 45 %
Platelets: 283 10*3/uL (ref 150–450)
RBC: 4.28 x10E6/uL (ref 3.77–5.28)
RDW: 13 % (ref 11.7–15.4)
WBC: 4.9 10*3/uL (ref 3.4–10.8)

## 2022-10-15 LAB — CALCIUM, IONIZED: Calcium, Ion: 5.6 mg/dL (ref 4.5–5.6)

## 2022-10-15 LAB — LIPID PANEL
Chol/HDL Ratio: 3.1 ratio (ref 0.0–4.4)
Cholesterol, Total: 191 mg/dL (ref 100–199)
HDL: 62 mg/dL (ref >39)
LDL Chol Calc (NIH): 110 mg/dL — ABNORMAL HIGH (ref 0–99)
Triglycerides: 108 mg/dL (ref 0–149)
VLDL Cholesterol Cal: 19 mg/dL (ref 5–40)

## 2022-10-15 LAB — THYROID PANEL WITH TSH
Free Thyroxine Index: 2.1 (ref 1.2–4.9)
T3 Uptake Ratio: 28 % (ref 24–39)
T4, Total: 7.4 ug/dL (ref 4.5–12.0)
TSH: 0.633 u[IU]/mL (ref 0.450–4.500)

## 2022-10-15 NOTE — Addendum Note (Signed)
Addended by: Sonny Masters on: 10/15/2022 01:33 PM   Modules accepted: Orders

## 2022-10-18 ENCOUNTER — Telehealth: Payer: Self-pay | Admitting: Family Medicine

## 2022-10-18 NOTE — Telephone Encounter (Signed)
Pt changed her mind. She is ok with seeing Dr Fransico Him in [place]. Need to resend referral per Dr Dennie Bible office.

## 2022-10-18 NOTE — Telephone Encounter (Signed)
Pt called stating that she was told by PCP that she was going to refer her to see an Endo specialist in [place] or [place] and not [place] or [place]. Pt says she got a call for scheduled from a [place] office. Pt only wants [place] or [place].

## 2022-10-26 ENCOUNTER — Encounter (INDEPENDENT_AMBULATORY_CARE_PROVIDER_SITE_OTHER): Payer: Medicare HMO | Admitting: Internal Medicine

## 2022-10-26 NOTE — Progress Notes (Signed)
Erroneous encounter. Please disregard.

## 2022-11-09 ENCOUNTER — Telehealth (INDEPENDENT_AMBULATORY_CARE_PROVIDER_SITE_OTHER): Payer: Medicare HMO | Admitting: Family

## 2022-11-09 ENCOUNTER — Encounter: Payer: Self-pay | Admitting: Family

## 2022-11-09 DIAGNOSIS — R6889 Other general symptoms and signs: Secondary | ICD-10-CM

## 2022-11-09 MED ORDER — OSELTAMIVIR PHOSPHATE 75 MG PO CAPS: 75.0000 mg | ORAL_CAPSULE | Freq: Two times a day (BID) | ORAL | 0 refills | Status: DC

## 2022-11-09 NOTE — Progress Notes (Signed)
Virtual Visit Consent   [name], you are scheduled for a virtual visit with a [facility] provider today. Just as with appointments in the office, your consent must be obtained to participate. Your consent will be active for this visit and any virtual visit you may have with one of our providers in the next 365 days. If you have a MyChart account, a copy of this consent can be sent to you electronically.  As this is a virtual visit, video technology does not allow for your provider to perform a traditional examination. This may limit your provider's ability to fully assess your condition. If your provider identifies any concerns that need to be evaluated in person or the need to arrange testing (such as labs, EKG, etc.), we will make arrangements to do so. Although advances in technology are sophisticated, we cannot ensure that it will always work on either your end or our end. If the connection with a video visit is poor, the visit may have to be switched to a telephone visit. With either a video or telephone visit, we are not always able to ensure that we have a secure connection.  By engaging in this virtual visit, you consent to the provision of healthcare and authorize for your insurance to be billed (if applicable) for the services provided during this visit. Depending on your insurance coverage, you may receive a charge related to this service.  I need to obtain your verbal consent now. Are you willing to proceed with your visit today? Kristine Mata has provided verbal consent on 11/09/2022 for a virtual visit (video or telephone). Jannifer Rodney, [name]  Date: 11/09/2022 3:49 PM  Virtual Visit via Video Note   I, Jannifer Rodney, connected with  Kristine Mata  ([id number], January 09, 1956) on 11/09/22 at  3:05 PM EST by a video-enabled telemedicine application and verified that I am speaking with the correct person using two identifiers.  Location: Patient: Virtual Visit Location  Patient: Home Provider: Virtual Visit Location Provider: Office/Clinic   I discussed the limitations of evaluation and management by telemedicine and the availability of in person appointments. The patient expressed understanding and agreed to proceed.    History of Present Illness: Kristine Mata is a 66 y.o. who identifies as a female who was assigned female at birth, and is being seen today for congestion and sore throat two days ago. Did a home COVID test that was negative.   HPI: URI  This is a new problem. The current episode started in the past 7 days. The problem has been waxing and waning. There has been no fever. Associated symptoms include congestion, coughing, ear pain, headaches, rhinorrhea, sinus pain, a sore throat and wheezing. Pertinent negatives include no joint pain, nausea or sneezing. She has tried acetaminophen for the symptoms.    Problems:  Patient Active Problem List   Diagnosis Date Noted   ERRONEOUS ENCOUNTER--DISREGARD 10/26/2022   History of adenomatous polyp of colon 11/19/2021   Primary osteoarthritis of right knee 09/07/2021   Osteopenia 03/12/2021   Chronic pain of right knee 03/10/2021   Mixed hyperlipidemia 07/27/2020   Morbid obesity (HCC) 07/21/2020   PSVT (paroxysmal supraventricular tachycardia) 03/16/2018   Essential hypertension     Allergies:  Allergies  Allergen Reactions   Anesthesia S-I-40 [Propofol] Nausea And Vomiting   Codeine Nausea And Vomiting   Amlodipine Other (See Comments)    Edema at 2.5 mg dosage.   Naproxen Palpitations   Medications:  Current  Outpatient Medications:    oseltamivir (TAMIFLU) 75 MG capsule, Take 1 capsule (75 mg total) by mouth 2 (two) times daily., Disp: 10 capsule, Rfl: 0   fluticasone (FLONASE) 50 MCG/ACT nasal spray, Place 2 sprays into both nostrils daily., Disp: 48 g, Rfl: 1   furosemide (LASIX) 80 MG tablet, Take 1 tablet (80 mg total) by mouth daily., Disp: 30 tablet, Rfl: 4   loratadine  (CLARITIN) 10 MG tablet, Take 1 tablet (10 mg total) by mouth daily., Disp: 90 tablet, Rfl: 1   lovastatin (MEVACOR) 40 MG tablet, Take 1 tablet (40 mg total) by mouth at bedtime., Disp: 100 tablet, Rfl: 1   metoprolol (TOPROL-XL) 200 MG 24 hr tablet, Take 1 tablet (200 mg total) by mouth daily., Disp: 90 tablet, Rfl: 1   Multiple Vitamins-Minerals (MULTIVITAMIN WITH MINERALS) tablet, Take 1 tablet by mouth every other day., Disp: , Rfl:    traZODone (DESYREL) 50 MG tablet, Take 0.5-1 tablets (25-50 mg total) by mouth at bedtime as needed for sleep., Disp: 30 tablet, Rfl: 2  Observations/Objective: Patient is well-developed, well-nourished in no acute distress.  Resting comfortably  at home.  Head is normocephalic, atraumatic.  No labored breathing.  Speech is clear and coherent with logical content.  Patient is alert and oriented at baseline.  Nasal congestion   Assessment and Plan: 1. Flu-like symptoms - oseltamivir (TAMIFLU) 75 MG capsule; Take 1 capsule (75 mg total) by mouth 2 (two) times daily.  Dispense: 10 capsule; Refill: 0  Rest Force fluids Alternate tylenol and motrin  Mucinex as needed Start Tamiflu Follow up if symptoms worsen or do not improve    Follow Up Instructions: I discussed the assessment and treatment plan with the patient. The patient was provided an opportunity to ask questions and all were answered. The patient agreed with the plan and demonstrated an understanding of the instructions.  A copy of instructions were sent to the patient via MyChart unless otherwise noted below.     The patient was advised to call back or seek an in-person evaluation if the symptoms worsen or if the condition fails to improve as anticipated.  Time:  I spent 8 minutes with the patient via telehealth technology discussing the above problems/concerns.    Evelina Dun, [name]

## 2022-11-12 ENCOUNTER — Telehealth: Payer: Self-pay | Admitting: *Deleted

## 2022-11-12 ENCOUNTER — Other Ambulatory Visit: Payer: Self-pay | Admitting: Family Medicine

## 2022-11-12 DIAGNOSIS — R062 Wheezing: Secondary | ICD-10-CM

## 2022-11-12 MED ORDER — PREDNISONE 20 MG PO TABS: 40.0000 mg | ORAL_TABLET | Freq: Every day | ORAL | 0 refills | Status: AC

## 2022-11-12 NOTE — Telephone Encounter (Signed)
Pt aware prednisone sent in to pharmacy. 

## 2022-11-12 NOTE — Telephone Encounter (Signed)
Patient states she is experiencing rash, cough, and wheezing when taking the Tamiflu. Reports she did not have cough or wheezing prior to taking the Tamiflu.  Advised patient to discontinue the Tamiflu and updated allergy list.   Patient would like something called in for the cough and wheezing to [facility].

## 2022-12-14 ENCOUNTER — Encounter: Payer: Self-pay | Admitting: "Endocrinology

## 2022-12-14 ENCOUNTER — Ambulatory Visit: Payer: Medicare HMO | Admitting: "Endocrinology

## 2022-12-14 DIAGNOSIS — I10 Essential (primary) hypertension: Secondary | ICD-10-CM | POA: Diagnosis not present

## 2022-12-14 DIAGNOSIS — E782 Mixed hyperlipidemia: Secondary | ICD-10-CM

## 2022-12-14 DIAGNOSIS — E042 Nontoxic multinodular goiter: Secondary | ICD-10-CM | POA: Insufficient documentation

## 2022-12-14 DIAGNOSIS — M85859 Other specified disorders of bone density and structure, unspecified thigh: Secondary | ICD-10-CM | POA: Diagnosis not present

## 2022-12-14 NOTE — Progress Notes (Unsigned)
Endocrinology Consult Note       12/14/2022, 1:13 PM  Kristine Mata is a 67 y.o.-year-old female, referred by her  Baruch Gouty, [name]  , for evaluation for hypercalcemia/hyperparathyroidism.   Past Medical History:  Diagnosis Date   Ectopic pregnancy    Heart murmur    Hyperlipidemia    Hypertension    Insomnia, unspecified    Osteopenia 03/12/2021   Right knee pain    SVT (supraventricular tachycardia)     Past Surgical History:  Procedure Laterality Date   ABDOMINAL HYSTERECTOMY     BREAST CYST EXCISION Left    67 years old   COLONOSCOPY  2016   [facility]; 1 tubular adenoma, 1 hyperplastic polyp, TI biopsy with reactive lymphoid hyperplasia without active or chronic enteritis.   COLONOSCOPY N/A 12/02/2021   Procedure: COLONOSCOPY;  Surgeon: Daneil Dolin, MD;  [facility];  Service: Endoscopy;  Laterality: N/A;  7:30am   cyst left breast      SALPINGECTOMY Left    TMJ ARTHROPLASTY      Social History   Tobacco Use   Smoking status: Never   Smokeless tobacco: Never  Vaping Use   Vaping Use: Never used  Substance Use Topics   Alcohol use: Never   Drug use: Never    Family History  Problem Relation Age of Onset   Diabetes Mother    Heart disease Mother        used nitroglycerin   Hypertension Mother    Thyroid disease Mother    Pancreatic cancer Mother    Hypertension Father    Diabetes Sister    Hypertension Sister    Colon cancer Sister        34   Hypertension Sister    Colon cancer Sister        83s   Hypertension Brother    Thyroid disease Brother    Diabetes Brother    Hypertension Brother    COPD Brother    Heart disease Brother    Stroke Brother    COPD Brother    Lung disease Brother    Stroke Maternal Grandmother    Breast cancer Neg Hx     Outpatient Encounter Medications as of 12/14/2022  Medication Sig   fluticasone (FLONASE) 50 MCG/ACT nasal spray Place 2  sprays into both nostrils daily.   furosemide (LASIX) 80 MG tablet Take 1 tablet (80 mg total) by mouth daily.   loratadine (CLARITIN) 10 MG tablet Take 1 tablet (10 mg total) by mouth daily.   lovastatin (MEVACOR) 40 MG tablet Take 1 tablet (40 mg total) by mouth at bedtime.   metoprolol (TOPROL-XL) 200 MG 24 hr tablet Take 1 tablet (200 mg total) by mouth daily.   Multiple Vitamins-Minerals (MULTIVITAMIN WITH MINERALS) tablet Take 1 tablet by mouth every other day.   traZODone (DESYREL) 50 MG tablet Take 0.5-1 tablets (25-50 mg total) by mouth at bedtime as needed for sleep.   [DISCONTINUED] oseltamivir (TAMIFLU) 75 MG capsule Take 1 capsule (75 mg total) by mouth 2 (two) times daily.   No facility-administered encounter medications on file as of 12/14/2022.  Allergies  Allergen Reactions   Anesthesia S-I-40 [Propofol] Nausea And Vomiting   Codeine Nausea And Vomiting   Olmesartan     Edema in feet   Amlodipine Other (See Comments)    Edema at 2.5 mg dosage.   Naproxen Palpitations   Tamiflu [Oseltamivir] Rash and Cough    Rash, Cough, and Wheezing     HPI  Kristine Mata was diagnosed with hypercalcemia in late August 2023.  Patient has no previously known history of parathyroid, pituitary, adrenal dysfunctions; no family history of such dysfunctions. -Review of herreferral package of most recent labs reveals calcium of 10.6 with the corresponding  PTH of 131 .  She did have a higher calcium of 11.0 in August 2023.    No prior history of fragility fractures or falls. No history of  kidney stones.  No history of CKD. Last BUN/Cr: 9/0.65 Her bone density recently was showing osteopenia.  she is not on HCTZ or other thiazide therapy.  No history of  vitamin D deficiency.  she is not on calcium supplements,  she eats dairy and green, leafy, vegetables on average amounts.  she does not have a family history of hypercalcemia, pituitary tumors, thyroid cancer, or osteoporosis.   Her other medical history includes multinodular goiter with euthyroid state.  She has hypertension and hyperlipidemia on treatment. She denies dysphagia, shortness of breath, nor voice change.  She denies hypertensions, tremors, nor heat intolerance. Her other concern is progressive weight gain, unable to lose weight.   ROS:  Constitutional: + Fluctuating body weight,  no fatigue, no subjective hyperthermia, no subjective hypothermia Eyes: no blurry vision, no xerophthalmia ENT: no sore throat, no nodules palpated in throat, no dysphagia/odynophagia, no hoarseness Cardiovascular: no Chest Pain, no Shortness of Breath, no palpitations, no leg swelling Respiratory: no cough, no shortness of breath  Gastrointestinal: no Nausea/Vomiting/Diarhhea Musculoskeletal: no muscle/joint aches Skin: no rashes Neurological: no tremors, no numbness, no tingling, no dizziness Psychiatric: no depression, no anxiety  PE: BP 128/78   Pulse 68   Ht 5\' 10"  (1.778 m)   Wt (!) 316 lb 6.4 oz (143.5 kg)   BMI 45.40 kg/m , Body mass index is 45.4 kg/m. Wt Readings from Last 3 Encounters:  12/14/22 (!) 316 lb 6.4 oz (143.5 kg)  10/14/22 (!) 313 lb 12.8 oz (142.3 kg)  09/02/22 (!) 313 lb (142 kg)    Constitutional: + BMI of 45.4, not in acute distress, normal state of mind Eyes: PERRLA, EOMI, no exophthalmos ENT: moist mucous membranes, no gross thyromegaly, no gross cervical lymphadenopathy Cardiovascular: normal precordial activity, Regular Rate and Rhythm, no Murmur/Rubs/Gallops Respiratory:  adequate breathing efforts, no gross chest deformity, Clear to auscultation bilaterally Gastrointestinal: abdomen soft, Non -tender, No distension, Bowel Sounds present Musculoskeletal: no gross deformities, strength intact in all four extremities Skin: moist, warm, no rashes Neurological: no tremor with outstretched hands, Deep tendon reflexes normal in bilateral lower extremities.     CMP ( most  recent) CMP     Component Value Date/Time   NA 141 10/14/2022 1146   K 4.2 10/14/2022 1146   CL 102 10/14/2022 1146   CO2 26 10/14/2022 1146   GLUCOSE 110 (H) 10/14/2022 1146   GLUCOSE 129 (H) 08/04/2022 2143   BUN 9 10/14/2022 1146   CREATININE 0.65 10/14/2022 1146   CALCIUM 10.4 (H) 10/14/2022 1146   PROT 7.0 10/14/2022 1146   ALBUMIN 4.2 10/14/2022 1146   AST 20 10/14/2022 1146   ALT 13 10/14/2022  1146   ALKPHOS 171 (H) 10/14/2022 1146   BILITOT 0.3 10/14/2022 1146   GFRNONAA >60 08/04/2022 2143   GFRAA 106 07/15/2020 1419     Diabetic Labs (most recent): No results found for: "HGBA1C", "MICROALBUR"   Lipid Panel ( most recent) Lipid Panel     Component Value Date/Time   CHOL 191 10/14/2022 1146   TRIG 108 10/14/2022 1146   HDL 62 10/14/2022 1146   CHOLHDL 3.1 10/14/2022 1146   LDLCALC 110 (H) 10/14/2022 1146   LABVLDL 19 10/14/2022 1146      Lab Results  Component Value Date   TSH 0.633 10/14/2022   TSH 0.624 09/02/2022   TSH 0.446 (L) 06/22/2022   TSH 0.627 03/10/2021      Assessment: 1. Hypercalcemia / Hyperparathyroidism 2.  Multinodular goiter 3.  Hypertension 4.  Hyperlipidemia   Plan: Patient has had several instances of elevated calcium, with the highest level being at 11 mg/dL. A corresponding intact PTH level was also high, at 108.  -Her recent vitamin D status is not documented.  It is likely that she has early primary hyperparathyroidism with hypercalcemia, however the data so far is not sufficient to make this diagnosis. - No apparent complications from hypercalcemia/hyperparathyroidism: no history of  nephrolithiasis,  osteoporosis,fragility fractures. No abdominal pain, no major mood disorders, no bone pain.  - I discussed with the patient about the physiology of calcium and parathyroid hormone, and possible  effects of  increased PTH/ Calcium , including kidney stones, cardiac dysrhythmias, osteoporosis, abdominal pain, etc.   - The  work up so far is not sufficient to reach a conclusion for definitive therapy.  she  needs more studies to confirm and classify the parathyroid dysfunction she may have. I will proceed to obtain  repeat intact PTH/calcium, serum magnesium, PTH RP, serum phosphorus.    It is also essential to obtain 24-hour urine calcium/creatinine to rule out the rare but important cause of mild elevation in calcium and PTH- [place] ( Familial Hypocalciuric Hypercalcemia), which may not require any active intervention.  Her bone density from April 2022 was showing osteopenia.  She will be considered for repeat bone density in April 2024.  - I will request for her next DEXA scan to include the distal  33% of  radius for evaluation of cortical bone, which is predominantly affected by hyperparathyroidism.   Her mild goiter with multiple small nodules will not need immediate intervention.  She will need thyroid function test and repeat thyroid ultrasound in a year. She has well-controlled blood pressure on metoprolol 200 mg p.o. daily.  She is also on furosemide as needed, advised to continue. For hyperlipidemia, she is on lovastatin 40 mg p.o. nightly.  Her most recent LDL was 110, advised to continue.  She will return in 2 weeks with her lab results.  If she is confirmed to have primary hyperparathyroidism, she will be considered for surgical intervention.  In light of her metabolic dysfunction indicated by morbid obesity, hyperlipidemia, hypertension, she is a good candidate for lifestyle medicine.  She will be approached for a complete package next visit.  She is advised to maintain close follow-up with her PCP.  - Time spent with the patient: 62  minutes, of which >50% was spent in obtaining information about her symptoms, reviewing her previous labs, evaluations, and treatments, counseling her about her hypercalcemia, multinodular goiter, hypertension, hyperlipidemia, and developing a plan to confirm the diagnosis  and long term treatment as necessary.  Please  refer to " Patient Self Inventory" in the Media  tab for reviewed elements of pertinent patient history.  Creig Hines participated in the discussions, expressed understanding, and voiced agreement with the above plans.  All questions were answered to her satisfaction. she is encouraged to contact clinic should she have any questions or concerns prior to her return visit.  - Return in about 2 weeks (around 12/28/2022) for Labs Today- Non-Fasting [place], [place], [facility] [address] [place], [place] [address] Phone: [phone]  Fax: [phone]    This note was partially dictated with voice recognition software. Similar sounding words can be transcribed inadequately or may not  be corrected upon review.  12/14/2022, 1:13 PM

## 2022-12-14 NOTE — Patient Instructions (Signed)

## 2022-12-19 LAB — PTH, INTACT AND CALCIUM
Calcium: 10.6 mg/dL — ABNORMAL HIGH (ref 8.7–10.3)
PTH: 108 pg/mL — ABNORMAL HIGH (ref 15–65)

## 2022-12-19 LAB — PHOSPHORUS: Phosphorus: 3 mg/dL (ref 3.0–4.3)

## 2022-12-19 LAB — PTH-RELATED PEPTIDE: PTH-related peptide: <2 pmol/L

## 2022-12-19 LAB — MAGNESIUM: Magnesium: 2.4 mg/dL — ABNORMAL HIGH (ref 1.6–2.3)

## 2022-12-21 LAB — CALCIUM, URINE, 24 HOUR
Calcium, 24H Urine: 333 mg/24 hr — ABNORMAL HIGH (ref 0–320)
Calcium, Urine: 11.9 mg/dL

## 2022-12-21 LAB — CREATININE, URINE, 24 HOUR
Creatinine, 24H Ur: 1182 mg/24 hr (ref 800–1800)
Creatinine, Urine: 42.2 mg/dL

## 2022-12-23 ENCOUNTER — Ambulatory Visit (INDEPENDENT_AMBULATORY_CARE_PROVIDER_SITE_OTHER): Payer: Medicare HMO | Admitting: Family Medicine

## 2022-12-23 ENCOUNTER — Encounter: Payer: Self-pay | Admitting: Family Medicine

## 2022-12-23 VITALS — BP 130/73 | HR 68 | Temp 97.5°F | Ht 70.0 in | Wt 307.0 lb

## 2022-12-23 DIAGNOSIS — Z0000 Encounter for general adult medical examination without abnormal findings: Secondary | ICD-10-CM | POA: Diagnosis not present

## 2022-12-23 DIAGNOSIS — Z136 Encounter for screening for cardiovascular disorders: Secondary | ICD-10-CM | POA: Diagnosis not present

## 2022-12-23 DIAGNOSIS — Z1231 Encounter for screening mammogram for malignant neoplasm of breast: Secondary | ICD-10-CM | POA: Diagnosis not present

## 2022-12-23 NOTE — Progress Notes (Signed)
Complete physical exam  Patient: Kristine Mata   DOB: 06-26-1956   67 y.o. Female  MRN: [id number]  Subjective:    Chief Complaint  Patient presents with   Annual Exam    LORIANNE MALBROUGH is a 67 y.o. female who presents today for a complete physical exam. She reports consuming a general diet. The patient does not participate in regular exercise at present. She generally feels fairly well. She reports sleeping well. She does have additional problems to discuss today. She reports her chronic knee pain seems to be worsening. She is going to schedule a follow up with orthopedics.    Most recent fall risk assessment:    12/23/2022   10:41 AM  Fall Risk   Falls in the past year? 1  Number falls in past yr: 0  Injury with Fall? 0  Follow up Falls prevention discussed     Most recent depression screenings:    12/23/2022   10:41 AM 10/14/2022   11:39 AM  PHQ 2/9 Scores  PHQ - 2 Score 1 1  PHQ- 9 Score 3 3    Vision:Within last year and Dental: No current dental problems and Receives regular dental care  Patient Active Problem List   Diagnosis Date Noted   Multinodular goiter 12/14/2022   Hypercalcemia 12/14/2022   History of adenomatous polyp of colon 11/19/2021   Primary osteoarthritis of right knee 09/07/2021   Osteopenia 03/12/2021   Chronic pain of right knee 03/10/2021   Mixed hyperlipidemia 07/27/2020   Morbid obesity ([place]) 07/21/2020   PSVT (paroxysmal supraventricular tachycardia) 03/16/2018   Essential hypertension, benign    Past Medical History:  Diagnosis Date   Ectopic pregnancy    Heart murmur    Hyperlipidemia    Hypertension    Insomnia, unspecified    Osteopenia 03/12/2021   Right knee pain    SVT (supraventricular tachycardia)    Past Surgical History:  Procedure Laterality Date   ABDOMINAL HYSTERECTOMY     BREAST CYST EXCISION Left    67 years old   COLONOSCOPY  2016   [facility]; 1 tubular adenoma, 1 hyperplastic polyp, TI biopsy with  reactive lymphoid hyperplasia without active or chronic enteritis.   COLONOSCOPY N/A 12/02/2021   Procedure: COLONOSCOPY;  Surgeon: Daneil Dolin, MD;  [facility];  Service: Endoscopy;  Laterality: N/A;  7:30am   cyst left breast      SALPINGECTOMY Left    TMJ ARTHROPLASTY     Social History   Tobacco Use   Smoking status: Never   Smokeless tobacco: Never  Vaping Use   Vaping Use: Never used  Substance Use Topics   Alcohol use: Never   Drug use: Never   Social History   Socioeconomic History   Marital status: Divorced    Spouse name: Not on file   Number of children: 1   Years of education: Not on file   Highest education level: Not on file  Occupational History   Occupation: Retired  Tobacco Use   Smoking status: Never   Smokeless tobacco: Never  Vaping Use   Vaping Use: Never used  Substance and Sexual Activity   Alcohol use: Never   Drug use: Never   Sexual activity: Not Currently    Birth control/protection: Surgical  Other Topics Concern   Not on file  Social History Narrative   Lives alone. Has one son - lives in [place]  Resource Strain: Low Risk  (06/14/2022)   Overall Financial Resource Strain (CARDIA)    Difficulty of Paying Living Expenses: Not hard at all  Food Insecurity: No Food Insecurity (06/14/2022)   Hunger Vital Sign    Worried About Running Out of Food in the Last Year: Never true    Ran Out of Food in the Last Year: Never true  Transportation Needs: No Transportation Needs (06/14/2022)   PRAPARE - Hydrologist (Medical): No    Lack of Transportation (Non-Medical): No  Physical Activity: Sufficiently Active (06/14/2022)   Exercise Vital Sign    Days of Exercise per Week: 5 days    Minutes of Exercise per Session: 30 min  Stress: No Stress Concern Present (06/14/2022)   [place]    Feeling  of Stress : Only a little  Social Connections: Moderately Isolated (06/14/2022)   Social Connection and Isolation Panel [NHANES]    Frequency of Communication with Friends and Family: More than three times a week    Frequency of Social Gatherings with Friends and Family: Three times a week    Attends Religious Services: More than 4 times per year    Active Member of Clubs or Organizations: No    Attends Archivist Meetings: Never    Marital Status: Divorced  Human resources officer Violence: Not At Risk (06/14/2022)   Humiliation, Afraid, Rape, and Kick questionnaire    Fear of Current or Ex-Partner: No    Emotionally Abused: No    Physically Abused: No    Sexually Abused: No   Family Status  Relation Name Status   Mother  Deceased   Father  Deceased   Sister  Alive   Sister  Alive   Sister  Deceased   Brother  Alive   Brother  Alive   Brother  Alive   Brother  38   Brother  Deceased   Brother  Deceased   MGM  Deceased   MGF  Deceased   PGM  Deceased   PGF  Deceased   Neg Hx  (Not Specified)   Family History  Problem Relation Age of Onset   Diabetes Mother    Heart disease Mother        used nitroglycerin   Hypertension Mother    Thyroid disease Mother    Pancreatic cancer Mother    Hypertension Father    Diabetes Sister    Hypertension Sister    Colon cancer Sister        60   Hypertension Sister    Colon cancer Sister        27s   Hypertension Brother    Thyroid disease Brother    Diabetes Brother    Hypertension Brother    COPD Brother    Heart disease Brother    Stroke Brother    COPD Brother    Lung disease Brother    Stroke Maternal Grandmother    Breast cancer Neg Hx    Allergies  Allergen Reactions   Anesthesia S-I-40 [Propofol] Nausea And Vomiting   Codeine Nausea And Vomiting   Olmesartan     Edema in feet   Amlodipine Other (See Comments)    Edema at 2.5 mg dosage.   Naproxen Palpitations   Tamiflu [Oseltamivir] Rash and Cough     Rash, Cough, and Wheezing      Patient Care Team: Baruch Gouty, [name] as PCP - General (  Family Medicine) Jena Gauss, Gerrit Friends, MD as Consulting Physician (Gastroenterology)   Outpatient Medications Prior to Visit  Medication Sig   fluticasone (FLONASE) 50 MCG/ACT nasal spray Place 2 sprays into both nostrils daily.   furosemide (LASIX) 80 MG tablet Take 1 tablet (80 mg total) by mouth daily. (Patient taking differently: Take 40 mg by mouth daily.)   loratadine (CLARITIN) 10 MG tablet Take 1 tablet (10 mg total) by mouth daily.   lovastatin (MEVACOR) 40 MG tablet Take 1 tablet (40 mg total) by mouth at bedtime.   metoprolol (TOPROL-XL) 200 MG 24 hr tablet Take 1 tablet (200 mg total) by mouth daily.   Multiple Vitamins-Minerals (MULTIVITAMIN WITH MINERALS) tablet Take 1 tablet by mouth every other day.   traZODone (DESYREL) 50 MG tablet Take 0.5-1 tablets (25-50 mg total) by mouth at bedtime as needed for sleep.   No facility-administered medications prior to visit.    Review of Systems  Constitutional:  Negative for chills, diaphoresis, fever, malaise/fatigue and weight loss.  HENT:  Negative for congestion, ear discharge, ear pain, hearing loss, nosebleeds, sinus pain, sore throat and tinnitus.   Eyes: Negative.   Respiratory: Negative.  Negative for stridor.   Cardiovascular: Negative.   Gastrointestinal: Negative.   Genitourinary: Negative.   Musculoskeletal:  Positive for joint pain and myalgias. Negative for back pain, falls and neck pain.  Skin:  Negative for itching and rash.  Neurological: Negative.   Endo/Heme/Allergies: Negative.   Psychiatric/Behavioral: Negative.            Objective:     BP 130/73   Pulse 68   Temp (!) 97.5 F (36.4 C) (Temporal)   Ht 5\' 10"  (1.778 m)   Wt (!) 307 lb (139.3 kg)   SpO2 98%   BMI 44.05 kg/m  BP Readings from Last 3 Encounters:  12/23/22 130/73  12/14/22 128/78  10/14/22 125/84   Wt Readings from Last 3 Encounters:   12/23/22 (!) 307 lb (139.3 kg)  12/14/22 (!) 316 lb 6.4 oz (143.5 kg)  10/14/22 (!) 313 lb 12.8 oz (142.3 kg)      Physical Exam Vitals and nursing note reviewed.  Constitutional:      General: She is not in acute distress.    Appearance: Normal appearance. She is well-developed and well-groomed. She is morbidly obese. She is not ill-appearing, toxic-appearing or diaphoretic.  HENT:     Head: Normocephalic and atraumatic.     Jaw: There is normal jaw occlusion.     Right Ear: Hearing, tympanic membrane, ear canal and external ear normal.     Left Ear: Hearing, tympanic membrane, ear canal and external ear normal.     Nose: Nose normal.     Mouth/Throat:     Lips: Pink.     Mouth: Mucous membranes are moist.     Pharynx: Oropharynx is clear. Uvula midline.  Eyes:     General: Lids are normal.     Extraocular Movements: Extraocular movements intact.     Conjunctiva/sclera: Conjunctivae normal.     Pupils: Pupils are equal, round, and reactive to light.  Neck:     Thyroid: No thyroid mass, thyromegaly or thyroid tenderness.     Vascular: No carotid bruit or JVD.     Trachea: Trachea and phonation normal.  Cardiovascular:     Rate and Rhythm: Normal rate and regular rhythm.     Chest Wall: PMI is not displaced.     Pulses: Normal pulses.  Heart sounds: Normal heart sounds. No murmur heard.    No friction rub. No gallop.  Pulmonary:     Effort: Pulmonary effort is normal. No respiratory distress.     Breath sounds: Normal breath sounds. No wheezing.  Abdominal:     General: Bowel sounds are normal. There is no distension or abdominal bruit.     Palpations: Abdomen is soft. There is no hepatomegaly or splenomegaly.     Tenderness: There is no abdominal tenderness. There is no right CVA tenderness or left CVA tenderness.     Hernia: No hernia is present.  Musculoskeletal:     Cervical back: Normal range of motion and neck supple.     Right upper leg: Normal.     Left  upper leg: Normal.     Right knee: Swelling present. Decreased range of motion. Tenderness present.     Left knee: Normal.     Right lower leg: Swelling present. No edema.     Left lower leg: Swelling present. No edema.  Lymphadenopathy:     Cervical: No cervical adenopathy.  Skin:    General: Skin is warm and dry.     Capillary Refill: Capillary refill takes less than 2 seconds.     Coloration: Skin is not cyanotic, jaundiced or pale.     Findings: No rash.  Neurological:     General: No focal deficit present.     Mental Status: She is alert and oriented to person, place, and time.     Sensory: Sensation is intact.     Motor: Motor function is intact.     Coordination: Coordination is intact.     Gait: Gait is intact.     Deep Tendon Reflexes: Reflexes are normal and symmetric.  Psychiatric:        Attention and Perception: Attention and perception normal.        Mood and Affect: Mood and affect normal.        Speech: Speech normal.        Behavior: Behavior normal. Behavior is cooperative.        Thought Content: Thought content normal.        Cognition and Memory: Cognition and memory normal.        Judgment: Judgment normal.      No results found for any visits on 12/23/22. Last CBC Lab Results  Component Value Date   WBC 4.9 10/14/2022   HGB 12.6 10/14/2022   HCT 38.1 10/14/2022   MCV 89 10/14/2022   MCH 29.4 10/14/2022   RDW 13.0 10/14/2022   PLT 283 10/14/2022   Last metabolic panel Lab Results  Component Value Date   GLUCOSE 110 (H) 10/14/2022   NA 141 10/14/2022   K 4.2 10/14/2022   CL 102 10/14/2022   CO2 26 10/14/2022   BUN 9 10/14/2022   CREATININE 0.65 10/14/2022   EGFR 96 10/14/2022   CALCIUM 10.6 (H) 12/14/2022   PHOS 3.0 12/14/2022   PROT 7.0 10/14/2022   ALBUMIN 4.2 10/14/2022   LABGLOB 2.8 10/14/2022   AGRATIO 1.5 10/14/2022   BILITOT 0.3 10/14/2022   ALKPHOS 171 (H) 10/14/2022   AST 20 10/14/2022   ALT 13 10/14/2022   ANIONGAP 8  08/04/2022   Last lipids Lab Results  Component Value Date   CHOL 191 10/14/2022   HDL 62 10/14/2022   LDLCALC 110 (H) 10/14/2022   TRIG 108 10/14/2022   CHOLHDL 3.1 10/14/2022   Last thyroid functions Lab Results  Component Value Date   TSH 0.633 10/14/2022   T4TOTAL 7.4 10/14/2022    Assessment & Plan:    Routine Health Maintenance and Physical Exam  Immunization History  Administered Date(s) Administered   Influenza,inj,Quad PF,6+ Mos 09/06/2017   Influenza-Unspecified 10/14/2015, 08/06/2016, 08/15/2018   Tdap 11/15/2016    Health Maintenance  Topic Date Due   COVID-19 Vaccine (1) 01/08/2023 (Originally 01/13/1961)   INFLUENZA VACCINE  02/13/2023 (Originally 06/15/2022)   Zoster Vaccines- Shingrix (1 of 2) 03/23/2023 (Originally 01/12/1974)   Pneumonia Vaccine 41+ Years old (1 of 1 - PCV) 06/23/2023 (Originally 01/13/2020)   DEXA SCAN  03/13/2023   Medicare Annual Wellness (AWV)  06/15/2023   MAMMOGRAM  09/09/2024   DTaP/Tdap/Td (2 - Td or Tdap) 11/15/2026   COLONOSCOPY (Pts 45-72yrs Insurance coverage will need to be confirmed)  12/02/2026   Hepatitis C Screening  Completed   HPV VACCINES  Aged Out    Discussed health benefits of physical activity, and encouraged her to engage in regular exercise appropriate for her age and condition.  Problem List Items Addressed This Visit   None Visit Diagnoses     Annual physical exam    -  Primary   Relevant Orders   CBC with Differential/Platelet   CMP14+EGFR   Lipid panel   Thyroid Panel With TSH   MM 3D SCREEN BREAST BILATERAL   Encounter for screening mammogram for malignant neoplasm of breast       Relevant Orders   MM 3D SCREEN BREAST BILATERAL      Return in about 6 months (around 06/23/2023), or if symptoms worsen or fail to improve, for HTN, BMI.     Monia Pouch, [name]

## 2022-12-24 LAB — THYROID PANEL WITH TSH
Free Thyroxine Index: 1.8 (ref 1.2–4.9)
T3 Uptake Ratio: 26 % (ref 24–39)
T4, Total: 7 ug/dL (ref 4.5–12.0)
TSH: 0.578 u[IU]/mL (ref 0.450–4.500)

## 2022-12-24 LAB — CBC WITH DIFFERENTIAL/PLATELET
Basophils Absolute: 0 10*3/uL (ref 0.0–0.2)
Basos: 1 %
EOS (ABSOLUTE): 0.1 10*3/uL (ref 0.0–0.4)
Eos: 2 %
Hematocrit: 39.7 % (ref 34.0–46.6)
Hemoglobin: 12.6 g/dL (ref 11.1–15.9)
Immature Grans (Abs): 0 10*3/uL (ref 0.0–0.1)
Immature Granulocytes: 0 %
Lymphocytes Absolute: 2.5 10*3/uL (ref 0.7–3.1)
Lymphs: 41 %
MCH: 28 pg (ref 26.6–33.0)
MCHC: 31.7 g/dL (ref 31.5–35.7)
MCV: 88 fL (ref 79–97)
Monocytes Absolute: 0.5 10*3/uL (ref 0.1–0.9)
Monocytes: 8 %
Neutrophils Absolute: 3 10*3/uL (ref 1.4–7.0)
Neutrophils: 48 %
Platelets: 332 10*3/uL (ref 150–450)
RBC: 4.5 x10E6/uL (ref 3.77–5.28)
RDW: 12.8 % (ref 11.7–15.4)
WBC: 6.2 10*3/uL (ref 3.4–10.8)

## 2022-12-24 LAB — LIPID PANEL
Chol/HDL Ratio: 2.6 ratio (ref 0.0–4.4)
Cholesterol, Total: 184 mg/dL (ref 100–199)
HDL: 72 mg/dL (ref >39)
LDL Chol Calc (NIH): 94 mg/dL (ref 0–99)
Triglycerides: 99 mg/dL (ref 0–149)
VLDL Cholesterol Cal: 18 mg/dL (ref 5–40)

## 2022-12-24 LAB — CMP14+EGFR
ALT: 15 IU/L (ref 0–32)
AST: 21 IU/L (ref 0–40)
Albumin/Globulin Ratio: 1.7 (ref 1.2–2.2)
Albumin: 4.4 g/dL (ref 3.9–4.9)
Alkaline Phosphatase: 167 IU/L — ABNORMAL HIGH (ref 44–121)
BUN/Creatinine Ratio: 19 (ref 12–28)
BUN: 12 mg/dL (ref 8–27)
Bilirubin Total: 0.3 mg/dL (ref 0.0–1.2)
CO2: 24 mmol/L (ref 20–29)
Calcium: 10.7 mg/dL — ABNORMAL HIGH (ref 8.7–10.3)
Chloride: 104 mmol/L (ref 96–106)
Creatinine, Ser: 0.62 mg/dL (ref 0.57–1.00)
Globulin, Total: 2.6 g/dL (ref 1.5–4.5)
Glucose: 110 mg/dL — ABNORMAL HIGH (ref 70–99)
Potassium: 4.3 mmol/L (ref 3.5–5.2)
Sodium: 142 mmol/L (ref 134–144)
Total Protein: 7 g/dL (ref 6.0–8.5)
eGFR: 98 mL/min/{1.73_m2} (ref >59)

## 2022-12-29 ENCOUNTER — Encounter: Payer: Self-pay | Admitting: "Endocrinology

## 2022-12-29 ENCOUNTER — Ambulatory Visit: Payer: Medicare HMO | Admitting: "Endocrinology

## 2022-12-29 DIAGNOSIS — I10 Essential (primary) hypertension: Secondary | ICD-10-CM

## 2022-12-29 DIAGNOSIS — E210 Primary hyperparathyroidism: Secondary | ICD-10-CM | POA: Insufficient documentation

## 2022-12-29 DIAGNOSIS — E042 Nontoxic multinodular goiter: Secondary | ICD-10-CM | POA: Diagnosis not present

## 2022-12-29 DIAGNOSIS — M85859 Other specified disorders of bone density and structure, unspecified thigh: Secondary | ICD-10-CM

## 2022-12-29 DIAGNOSIS — E782 Mixed hyperlipidemia: Secondary | ICD-10-CM | POA: Diagnosis not present

## 2022-12-29 NOTE — Progress Notes (Signed)
Endocrinology follow-up note     12/29/2022, 11:23 AM  Kristine Mata is a 67 y.o.-year-old female, referred by her  Baruch Gouty, [name]  , for evaluation for hypercalcemia/hyperparathyroidism.   Past Medical History:  Diagnosis Date   Ectopic pregnancy    Heart murmur    Hyperlipidemia    Hypertension    Insomnia, unspecified    Osteopenia 03/12/2021   Right knee pain    SVT (supraventricular tachycardia)     Past Surgical History:  Procedure Laterality Date   ABDOMINAL HYSTERECTOMY     BREAST CYST EXCISION Left    67 years old   COLONOSCOPY  2016   [facility]; 1 tubular adenoma, 1 hyperplastic polyp, TI biopsy with reactive lymphoid hyperplasia without active or chronic enteritis.   COLONOSCOPY N/A 12/02/2021   Procedure: COLONOSCOPY;  Surgeon: Daneil Dolin, MD;  Location: [facility];  Service: Endoscopy;  Laterality: N/A;  7:30am   cyst left breast      SALPINGECTOMY Left    TMJ ARTHROPLASTY      Social History   Tobacco Use   Smoking status: Never   Smokeless tobacco: Never  Vaping Use   Vaping Use: Never used  Substance Use Topics   Alcohol use: Never   Drug use: Never    Family History  Problem Relation Age of Onset   Diabetes Mother    Heart disease Mother        used nitroglycerin   Hypertension Mother    Thyroid disease Mother    Pancreatic cancer Mother    Hypertension Father    Diabetes Sister    Hypertension Sister    Colon cancer Sister        45   Hypertension Sister    Colon cancer Sister        63s   Hypertension Brother    Thyroid disease Brother    Diabetes Brother    Hypertension Brother    COPD Brother    Heart disease Brother    Stroke Brother    COPD Brother    Lung disease Brother    Stroke Maternal Grandmother    Breast cancer Neg Hx     Outpatient Encounter Medications as of 12/29/2022  Medication Sig   fluticasone (FLONASE) 50 MCG/ACT nasal spray Place 2  sprays into both nostrils daily.   furosemide (LASIX) 80 MG tablet Take 1 tablet (80 mg total) by mouth daily. (Patient taking differently: Take 40 mg by mouth daily.)   loratadine (CLARITIN) 10 MG tablet Take 1 tablet (10 mg total) by mouth daily.   lovastatin (MEVACOR) 40 MG tablet Take 1 tablet (40 mg total) by mouth at bedtime.   metoprolol (TOPROL-XL) 200 MG 24 hr tablet Take 1 tablet (200 mg total) by mouth daily.   Multiple Vitamins-Minerals (MULTIVITAMIN WITH MINERALS) tablet Take 1 tablet by mouth every other day.   traZODone (DESYREL) 50 MG tablet Take 0.5-1 tablets (25-50 mg total) by mouth at bedtime as needed for sleep.   No facility-administered encounter medications on file as of 12/29/2022.    Allergies  Allergen Reactions   Anesthesia S-I-40 [Propofol] Nausea  And Vomiting   Codeine Nausea And Vomiting   Olmesartan     Edema in feet   Amlodipine Other (See Comments)    Edema at 2.5 mg dosage.   Naproxen Palpitations   Tamiflu [Oseltamivir] Rash and Cough    Rash, Cough, and Wheezing     HPI  Kristine Mata was diagnosed with hypercalcemia in late August 2023.  She is returning with new set of labs including 24-hour urine calcium measurement.  She denies prior history of pituitary, adrenal dysfunction.  No family history of such dysfunctions.   -Review of herreferral package of most recent labs reveals calcium of 10.6 with the corresponding  PTH of 131 .  She did have a higher calcium of 11.0 in August 2023.  Her most recent set of labs show calcium of 10.7, PTH of 108.  Her 24-hour urine calcium is also elevated at 333 mg / 24 hours. No prior history of fragility fractures or falls, she does have some history of osteopenia. No history of  kidney stones.  No history of CKD. Last BUN/Cr: 9/0.65   she is not on HCTZ or other thiazide therapy.  No history of  vitamin D deficiency.  she is not on calcium supplements,  she eats dairy and green, leafy, vegetables on  average amounts.  she does not have a family history of hypercalcemia, pituitary tumors, thyroid cancer, or osteoporosis.  Her other medical history includes multinodular goiter with euthyroid state.  She has hypertension and hyperlipidemia on treatment. She denies dysphagia, shortness of breath, nor voice change.  She denies hypertensions, tremors, nor heat intolerance. Her other concern is progressive weight gain, unable to lose weight.   ROS:  Constitutional: + Fluctuating body weight,  no fatigue, no subjective hyperthermia, no subjective hypothermia Eyes: no blurry vision, no xerophthalmia ENT: no sore throat, no nodules palpated in throat, no dysphagia/odynophagia, no hoarseness   PE: BP 126/82   Pulse 68   Ht 5' 10"$  (1.778 m)   BMI 44.05 kg/m , Body mass index is 44.05 kg/m. Wt Readings from Last 3 Encounters:  12/23/22 (!) 307 lb (139.3 kg)  12/14/22 (!) 316 lb 6.4 oz (143.5 kg)  10/14/22 (!) 313 lb 12.8 oz (142.3 kg)    Constitutional: + BMI of 45.4, not in acute distress, normal state of mind Eyes: PERRLA, EOMI, no exophthalmos ENT: moist mucous membranes, no gross thyromegaly, no gross cervical lymphadenopathy    CMP ( most recent) CMP     Component Value Date/Time   NA 142 12/23/2022 1110   K 4.3 12/23/2022 1110   CL 104 12/23/2022 1110   CO2 24 12/23/2022 1110   GLUCOSE 110 (H) 12/23/2022 1110   GLUCOSE 129 (H) 08/04/2022 2143   BUN 12 12/23/2022 1110   CREATININE 0.62 12/23/2022 1110   CALCIUM 10.7 (H) 12/23/2022 1110   PROT 7.0 12/23/2022 1110   ALBUMIN 4.4 12/23/2022 1110   AST 21 12/23/2022 1110   ALT 15 12/23/2022 1110   ALKPHOS 167 (H) 12/23/2022 1110   BILITOT 0.3 12/23/2022 1110   GFRNONAA >60 08/04/2022 2143   GFRAA 106 07/15/2020 1419     Diabetic Labs (most recent): No results found for: "HGBA1C", "MICROALBUR"   Lipid Panel ( most recent) Lipid Panel     Component Value Date/Time   CHOL 184 12/23/2022 1110   TRIG 99 12/23/2022  1110   HDL 72 12/23/2022 1110   CHOLHDL 2.6 12/23/2022 1110   LDLCALC 94 12/23/2022 1110  LABVLDL 18 12/23/2022 1110      Lab Results  Component Value Date   TSH 0.578 12/23/2022   TSH 0.633 10/14/2022   TSH 0.624 09/02/2022   TSH 0.446 (L) 06/22/2022   TSH 0.627 03/10/2021      Assessment: 1. Hypercalcemia / Hyperparathyroidism 2.  Multinodular goiter 3.  Hypertension 4.  Hyperlipidemia   Plan: Patient has had several instances of elevated calcium, with the highest level being at 11 mg/dL. A corresponding intact PTH level was also high, at 108.  -Her 24-hour urine calcium is elevated at 333 mg per 24 hours. This is consistent with primary hyperparathyroidism with hypercalcemia, definitive treatment is indicated.  She has osteopenia as a complication. - No apparent complications from hypercalcemia/hyperparathyroidism: no history of  nephrolithiasis,  fragility fractures. No abdominal pain, no major mood disorders, no bone pain.  - I discussed with the patient about the physiology of calcium and parathyroid hormone, and possible  effects of  increased PTH/ Calcium , including kidney stones, cardiac dysrhythmias, osteoporosis, abdominal pain, etc.    Her bone density from April 2022 was showing osteopenia.  She will be considered for repeat bone density in April 2024. I discussed and arranged for referral for her to see Dr. Armandina Gemma for possible parathyroidectomy. Her mild goiter with multiple small nodules will not need immediate intervention.  Thyroid function tests are consistent with euthyroid state.  She will need a follow-up thyroid ultrasound September 2024.  She has well-controlled blood pressure on metoprolol 200 mg p.o. daily.  She is also on furosemide as needed, advised to continue. For hyperlipidemia, she is on lovastatin 40 mg p.o. nightly.  Her most recent LDL was 110, advised to continue. In light of her metabolic dysfunction indicated by morbid obesity,  hyperlipidemia, hypertension, she is a good candidate for lifestyle medicine.  She will be approached for a complete package next visit.  She is advised to maintain close follow-up with her PCP.   I spent  26 minutes in the care of the patient today including review of labs from Thyroid Function, CMP, 24-hour urine studies, bone density, and other relevant labs ; imaging/biopsy records (current and previous including abstractions from other facilities); face-to-face time discussing  her lab results and symptoms, medications doses, her options of short and long term treatment based on the latest standards of care / guidelines;   and documenting the encounter.  Creig Hines  participated in the discussions, expressed understanding, and voiced agreement with the above plans.  All questions were answered to her satisfaction. she is encouraged to contact clinic should she have any questions or concerns prior to her return visit.   - Return in about 8 weeks (around 02/23/2023) for F/U with Labs after Surgery.   Glade Lloyd, MD [facility] [address] [place], [place] [address] Phone: [phone]  Fax: [phone]    This note was partially dictated with voice recognition software. Similar sounding words can be transcribed inadequately or may not  be corrected upon review.  12/29/2022, 11:23 AM

## 2023-01-13 ENCOUNTER — Encounter: Payer: Self-pay | Admitting: Radiology

## 2023-01-18 ENCOUNTER — Other Ambulatory Visit: Payer: Self-pay | Admitting: Family Medicine

## 2023-01-18 DIAGNOSIS — I10 Essential (primary) hypertension: Secondary | ICD-10-CM

## 2023-01-18 DIAGNOSIS — I471 Supraventricular tachycardia, unspecified: Secondary | ICD-10-CM

## 2023-02-02 DIAGNOSIS — E213 Hyperparathyroidism, unspecified: Secondary | ICD-10-CM | POA: Diagnosis not present

## 2023-02-04 ENCOUNTER — Other Ambulatory Visit: Payer: Self-pay | Admitting: *Deleted

## 2023-02-04 DIAGNOSIS — E782 Mixed hyperlipidemia: Secondary | ICD-10-CM

## 2023-02-04 MED ORDER — LOVASTATIN 40 MG PO TABS: 40.0000 mg | ORAL_TABLET | Freq: Every day | ORAL | 0 refills | Status: DC

## 2023-02-08 ENCOUNTER — Encounter: Payer: Self-pay | Admitting: Surgery

## 2023-02-14 ENCOUNTER — Other Ambulatory Visit (HOSPITAL_COMMUNITY): Payer: Self-pay | Admitting: Surgery

## 2023-02-14 DIAGNOSIS — D351 Benign neoplasm of parathyroid gland: Secondary | ICD-10-CM

## 2023-02-14 DIAGNOSIS — E213 Hyperparathyroidism, unspecified: Secondary | ICD-10-CM

## 2023-02-18 IMAGING — MG MM DIGITAL SCREENING BILAT W/ TOMO AND CAD
8 series · 8 of 24 positions shown · non-contrast
Comparison: Previous exam(s).

CLINICAL DATA: Screening.

EXAM:
DIGITAL SCREENING BILATERAL MAMMOGRAM WITH TOMOSYNTHESIS AND CAD
TECHNIQUE: Bilateral screening digital craniocaudal and mediolateral oblique
mammograms were obtained. Bilateral screening digital breast
tomosynthesis was performed. The images were evaluated with
computer-aided detection.

[R MLO synth-2D]
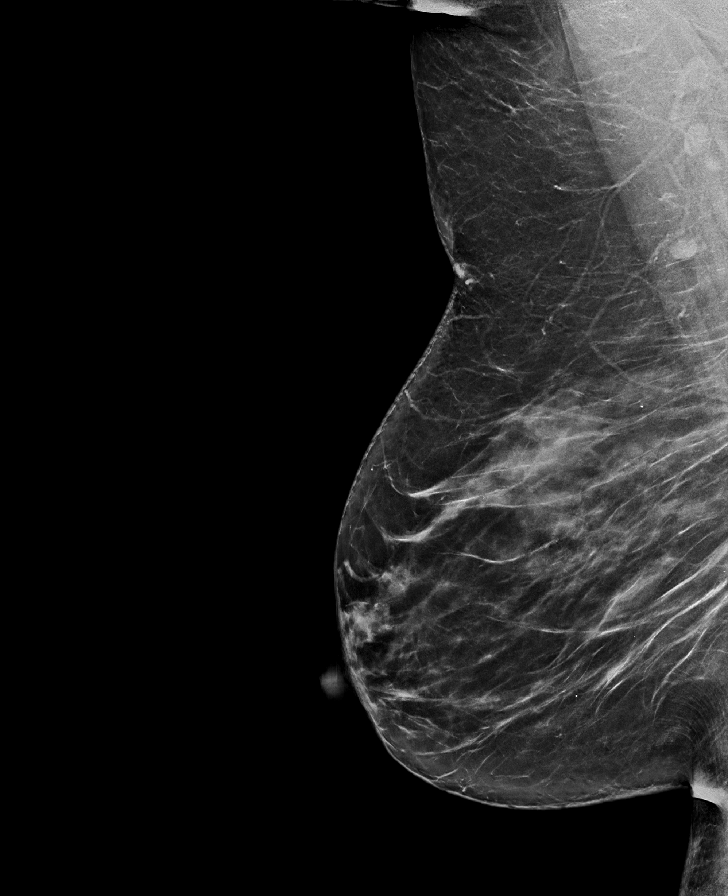

[R CC synth-2D]
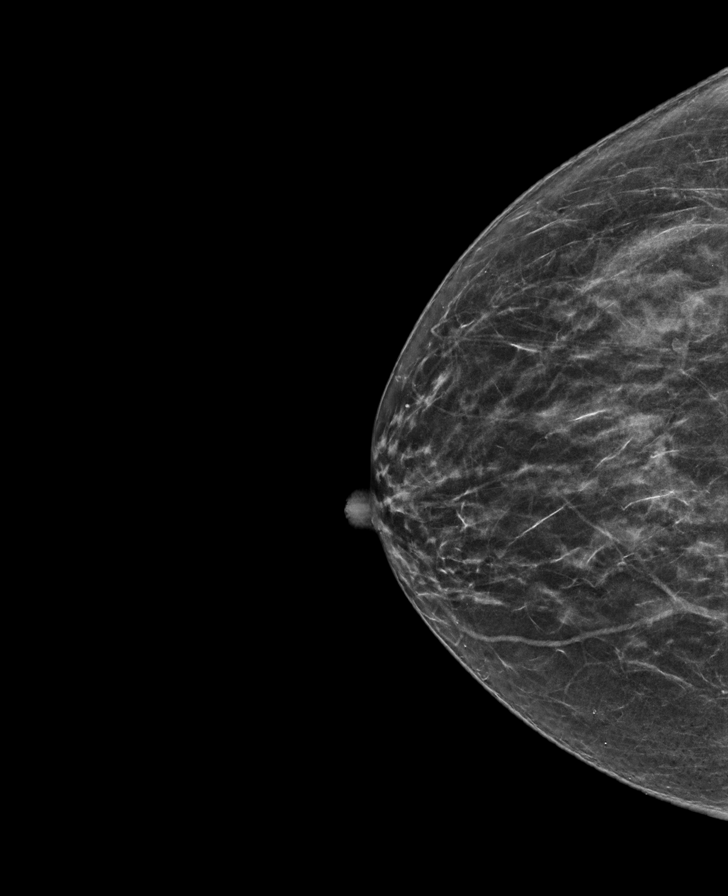

[L MLO synth-2D]
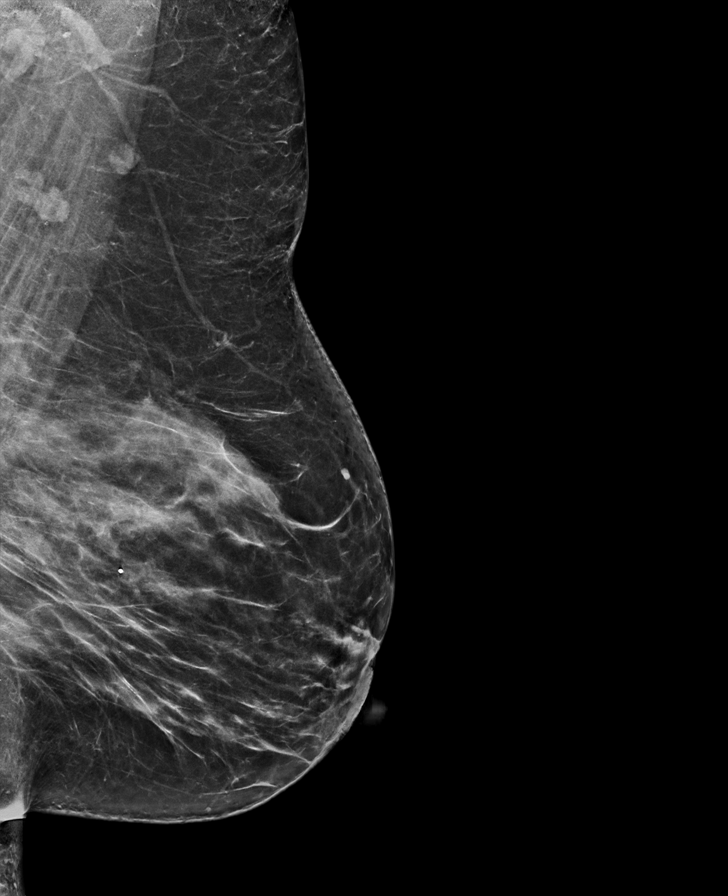

[L CC synth-2D]
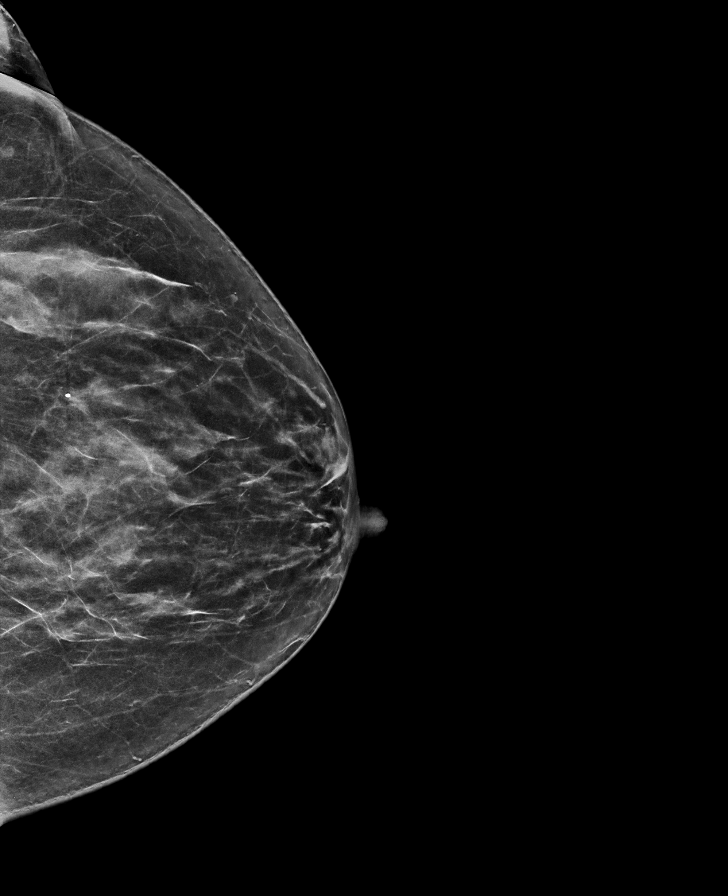

[L MLO tomo · tomo slice 39/78.0]
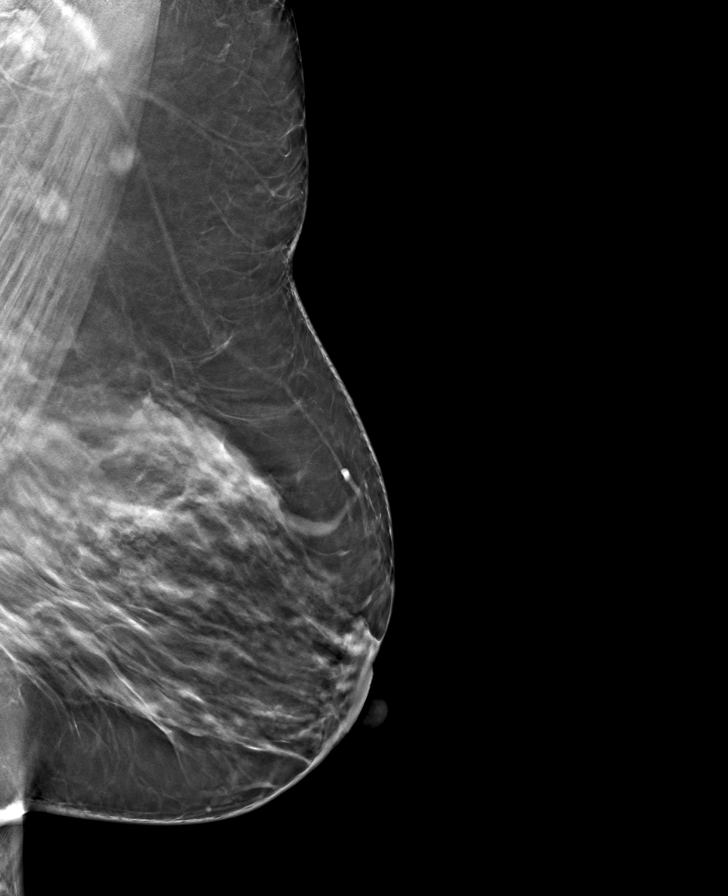

[R MLO tomo · tomo slice 39/78.0]
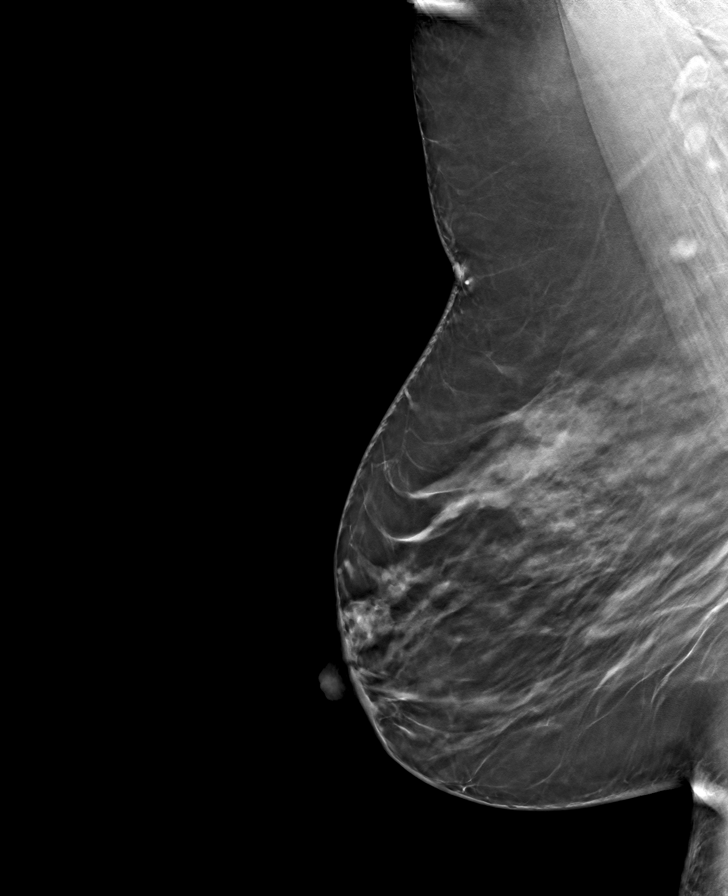

[R CC tomo · tomo slice 31/62.0]
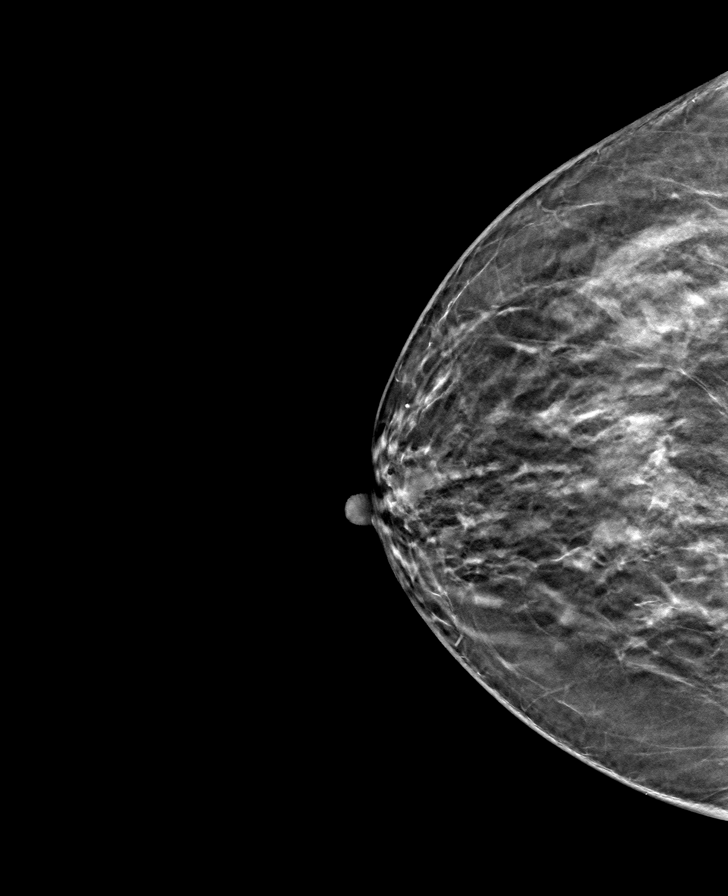

[L CC tomo · tomo slice 35/69.0]
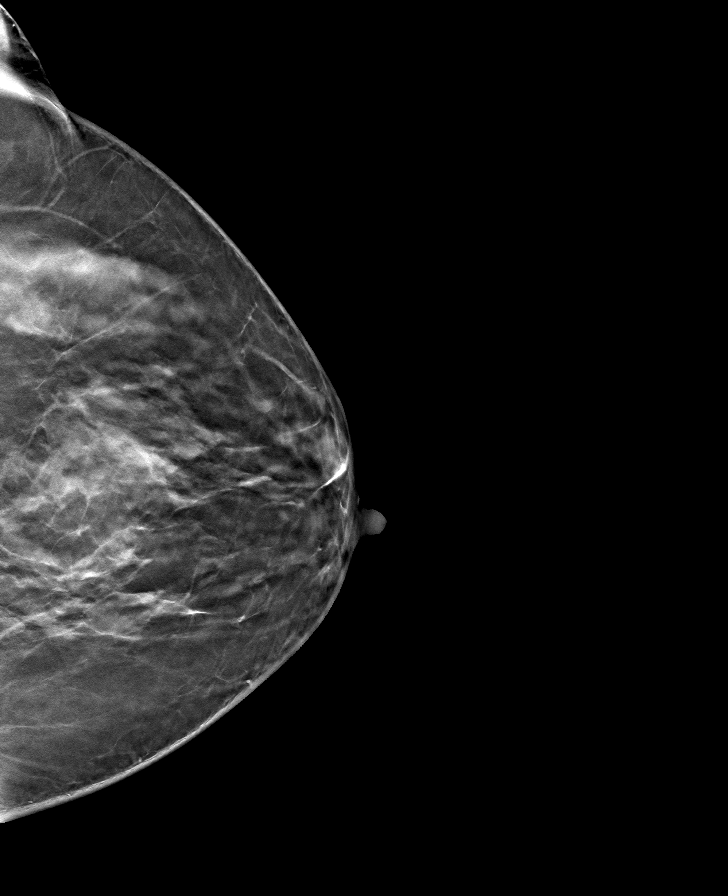

[8 of 24 positions shown; findings below may reference images not displayed]

ACR Breast Density Category c: The breast tissue is heterogeneously
dense, which may obscure small masses.
FINDINGS: There are no findings suspicious for malignancy.
IMPRESSION: No mammographic evidence of malignancy. A result letter of this
screening mammogram will be mailed directly to the patient.

RECOMMENDATION:
Screening mammogram in one year. (Code:[id number]-W-BC3)

BI-RADS CATEGORY  1: Negative.

## 2023-02-21 ENCOUNTER — Encounter (HOSPITAL_COMMUNITY)
Admission: RE | Admit: 2023-02-21 | Discharge: 2023-02-21 | Disposition: A | Discharge status: Home / Self Care | Payer: Medicare HMO | Source: Ambulatory Visit | Attending: Surgery | Admitting: Surgery

## 2023-02-21 DIAGNOSIS — D351 Benign neoplasm of parathyroid gland: Secondary | ICD-10-CM

## 2023-02-21 DIAGNOSIS — E213 Hyperparathyroidism, unspecified: Secondary | ICD-10-CM

## 2023-02-21 DIAGNOSIS — E210 Primary hyperparathyroidism: Secondary | ICD-10-CM | POA: Diagnosis not present

## 2023-02-21 MED ORDER — TECHNETIUM TC 99M SESTAMIBI GENERIC - CARDIOLITE
25.0000 | Freq: Once | INTRAVENOUS | Status: AC | PRN
  Administered 2023-02-21: 25.3 via INTRAVENOUS

## 2023-02-22 ENCOUNTER — Telehealth: Payer: Self-pay | Admitting: "Endocrinology

## 2023-02-22 NOTE — Telephone Encounter (Signed)
Pt is still in process of seeing Dr. Gerrit Friends, who has scheduled more tests.  Do we need to rescehdule this appointment after he is done with pt?

## 2023-02-23 ENCOUNTER — Other Ambulatory Visit: Payer: Self-pay | Admitting: Family Medicine

## 2023-02-23 ENCOUNTER — Telehealth: Payer: Self-pay | Admitting: Family Medicine

## 2023-02-23 ENCOUNTER — Ambulatory Visit: Payer: Self-pay | Admitting: Surgery

## 2023-02-23 DIAGNOSIS — E210 Primary hyperparathyroidism: Secondary | ICD-10-CM

## 2023-02-23 NOTE — Progress Notes (Signed)
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  This may have also been seen on the USN study in Sept 2023.  Will plan to proceed with parathyroidectomy as discussed in the office. Will enter orders and have schedulers contact patient to schedule procedure as an outpatient.  Darnell Level, MD [facility] practice Office: [phone]

## 2023-02-23 NOTE — Telephone Encounter (Signed)
Pt would like a second opinion from endo to see Satira Mccallum in [facility] phone [phone]. Please call back

## 2023-02-24 NOTE — Telephone Encounter (Signed)
Patient aware and verbalizes understanding. 

## 2023-03-01 ENCOUNTER — Telehealth: Payer: Self-pay | Admitting: Family Medicine

## 2023-03-01 NOTE — Progress Notes (Signed)
Telephone call to patient to discuss results of sestamibi.  Patient with many questions and concerns, all addressed during the phone call.  Patient would like to discuss with her primary MD and possibly have cardiac evaluation prior to scheduling surgery.  Patient will notify my office when she is ready to proceed with surgery.  Darnell Level, MD [facility] practice Office: [phone]

## 2023-03-01 NOTE — Telephone Encounter (Signed)
Contacted BENICIA BERGEVIN to schedule their annual wellness visit. Appointment made for 03/09/2023.  Thank you,  Judeth Cornfield,  AMB Clinical Support [facility] Program Direct Dial ??[phone]

## 2023-03-02 ENCOUNTER — Ambulatory Visit: Payer: Medicare HMO | Admitting: "Endocrinology

## 2023-03-08 NOTE — Patient Instructions (Signed)
[name] , Thank you for taking time to come for your Medicare Wellness Visit. I appreciate your ongoing commitment to your health goals. Please review the following plan we discussed and let me know if I can assist you in the future.   These are the goals we discussed:  Goals      DIET - EAT MORE FRUITS AND VEGETABLES     Exercise 3x per week (30 min per time)        This is a list of the screening recommended for you and due dates:  Health Maintenance  Topic Date Due   COVID-19 Vaccine (1) Never done   Zoster (Shingles) Vaccine (1 of 2) 03/23/2023*   Pneumonia Vaccine (1 of 1 - PCV) 06/23/2023*   DEXA scan (bone density measurement)  03/13/2023   Medicare Annual Wellness Visit  06/15/2023   Flu Shot  06/16/2023   Mammogram  09/09/2024   DTaP/Tdap/Td vaccine (2 - Td or Tdap) 11/15/2026   Colon Cancer Screening  12/02/2026   Hepatitis C Screening: USPSTF Recommendation to screen - Ages 18-79 yo.  Completed   HPV Vaccine  Aged Out  *Topic was postponed. The date shown is not the original due date.    Advanced directives: ***  Conditions/risks identified: Aim for 30 minutes of exercise or brisk walking, 6-8 glasses of water, and 5 servings of fruits and vegetables each day.   Next appointment: Follow up in one year for your annual wellness visit    Preventive Care 65 Years and Older, Female Preventive care refers to lifestyle choices and visits with your health care provider that can promote health and wellness. What does preventive care include? A yearly physical exam. This is also called an annual well check. Dental exams once or twice a year. Routine eye exams. Ask your health care provider how often you should have your eyes checked. Personal lifestyle choices, including: Daily care of your teeth and gums. Regular physical activity. Eating a healthy diet. Avoiding tobacco and drug use. Limiting alcohol use. Practicing safe sex. Taking low-dose aspirin every  day. Taking vitamin and mineral supplements as recommended by your health care provider. What happens during an annual well check? The services and screenings done by your health care provider during your annual well check will depend on your age, overall health, lifestyle risk factors, and family history of disease. Counseling  Your health care provider may ask you questions about your: Alcohol use. Tobacco use. Drug use. Emotional well-being. Home and relationship well-being. Sexual activity. Eating habits. History of falls. Memory and ability to understand (cognition). Work and work Astronomer. Reproductive health. Screening  You may have the following tests or measurements: Height, weight, and BMI. Blood pressure. Lipid and cholesterol levels. These may be checked every 5 years, or more frequently if you are over 43 years old. Skin check. Lung cancer screening. You may have this screening every year starting at age 17 if you have a 30-pack-year history of smoking and currently smoke or have quit within the past 15 years. Fecal occult blood test (FOBT) of the stool. You may have this test every year starting at age 40. Flexible sigmoidoscopy or colonoscopy. You may have a sigmoidoscopy every 5 years or a colonoscopy every 10 years starting at age 32. Hepatitis C blood test. Hepatitis B blood test. Sexually transmitted disease (STD) testing. Diabetes screening. This is done by checking your blood sugar (glucose) after you have not eaten for a while (fasting). You may have  this done every 1-3 years. Bone density scan. This is done to screen for osteoporosis. You may have this done starting at age 65. Mammogram. This may be done every 1-2 years. Talk to your health care provider about how often you should have regular mammograms. Talk with your health care provider about your test results, treatment options, and if necessary, the need for more tests. Vaccines  Your health care  provider may recommend certain vaccines, such as: Influenza vaccine. This is recommended every year. Tetanus, diphtheria, and acellular pertussis (Tdap, Td) vaccine. You may need a Td booster every 10 years. Zoster vaccine. You may need this after age 22. Pneumococcal 13-valent conjugate (PCV13) vaccine. One dose is recommended after age 22. Pneumococcal polysaccharide (PPSV23) vaccine. One dose is recommended after age 71. Talk to your health care provider about which screenings and vaccines you need and how often you need them. This information is not intended to replace advice given to you by your health care provider. Make sure you discuss any questions you have with your health care provider. Document Released: 11/28/2015 Document Revised: 07/21/2016 Document Reviewed: 09/02/2015 Elsevier Interactive Patient Education  2017 [employer] Prevention in the Home Falls can cause injuries. They can happen to people of all ages. There are many things you can do to make your home safe and to help prevent falls. What can I do on the outside of my home? Regularly fix the edges of walkways and driveways and fix any cracks. Remove anything that might make you trip as you walk through a door, such as a raised step or threshold. Trim any bushes or trees on the path to your home. Use bright outdoor lighting. Clear any walking paths of anything that might make someone trip, such as rocks or tools. Regularly check to see if handrails are loose or broken. Make sure that both sides of any steps have handrails. Any raised decks and porches should have guardrails on the edges. Have any leaves, snow, or ice cleared regularly. Use sand or salt on walking paths during winter. Clean up any spills in your garage right away. This includes oil or grease spills. What can I do in the bathroom? Use night lights. Install grab bars by the toilet and in the tub and shower. Do not use towel bars as grab  bars. Use non-skid mats or decals in the tub or shower. If you need to sit down in the shower, use a plastic, non-slip stool. Keep the floor dry. Clean up any water that spills on the floor as soon as it happens. Remove soap buildup in the tub or shower regularly. Attach bath mats securely with double-sided non-slip rug tape. Do not have throw rugs and other things on the floor that can make you trip. What can I do in the bedroom? Use night lights. Make sure that you have a light by your bed that is easy to reach. Do not use any sheets or blankets that are too big for your bed. They should not hang down onto the floor. Have a firm chair that has side arms. You can use this for support while you get dressed. Do not have throw rugs and other things on the floor that can make you trip. What can I do in the kitchen? Clean up any spills right away. Avoid walking on wet floors. Keep items that you use a lot in easy-to-reach places. If you need to reach something above you, use a strong step stool  that has a grab bar. Keep electrical cords out of the way. Do not use floor polish or wax that makes floors slippery. If you must use wax, use non-skid floor wax. Do not have throw rugs and other things on the floor that can make you trip. What can I do with my stairs? Do not leave any items on the stairs. Make sure that there are handrails on both sides of the stairs and use them. Fix handrails that are broken or loose. Make sure that handrails are as long as the stairways. Check any carpeting to make sure that it is firmly attached to the stairs. Fix any carpet that is loose or worn. Avoid having throw rugs at the top or bottom of the stairs. If you do have throw rugs, attach them to the floor with carpet tape. Make sure that you have a light switch at the top of the stairs and the bottom of the stairs. If you do not have them, ask someone to add them for you. What else can I do to help prevent  falls? Wear shoes that: Do not have high heels. Have rubber bottoms. Are comfortable and fit you well. Are closed at the toe. Do not wear sandals. If you use a stepladder: Make sure that it is fully opened. Do not climb a closed stepladder. Make sure that both sides of the stepladder are locked into place. Ask someone to hold it for you, if possible. Clearly mark and make sure that you can see: Any grab bars or handrails. First and last steps. Where the edge of each step is. Use tools that help you move around (mobility aids) if they are needed. These include: Canes. Walkers. Scooters. Crutches. Turn on the lights when you go into a dark area. Replace any light bulbs as soon as they burn out. Set up your furniture so you have a clear path. Avoid moving your furniture around. If any of your floors are uneven, fix them. If there are any pets around you, be aware of where they are. Review your medicines with your doctor. Some medicines can make you feel dizzy. This can increase your chance of falling. Ask your doctor what other things that you can do to help prevent falls. This information is not intended to replace advice given to you by your health care provider. Make sure you discuss any questions you have with your health care provider. Document Released: 08/28/2009 Document Revised: 04/08/2016 Document Reviewed: 12/06/2014 Elsevier Interactive Patient Education  2017 [employer].

## 2023-03-08 NOTE — Progress Notes (Unsigned)
Subjective:   Kristine Mata is a 67 y.o. female who presents for Medicare Annual (Subsequent) preventive examination.  Review of Systems    ***       Objective:    There were no vitals filed for this visit. There is no height or weight on file to calculate BMI.     06/14/2022   10:32 AM 12/02/2021    6:32 AM 06/11/2021   11:21 AM 03/17/2018   10:57 AM  Advanced Directives  Does Patient Have a Medical Advance Directive? No No Yes No  Type of Engineer, production of Healthcare Power of Attorney in Chart?   No - copy requested   Would patient like information on creating a medical advance directive? No - Patient declined No - Patient declined      Current Medications (verified) Outpatient Encounter Medications as of 03/09/2023  Medication Sig   fluticasone (FLONASE) 50 MCG/ACT nasal spray Place 2 sprays into both nostrils daily.   furosemide (LASIX) 80 MG tablet Take 1 tablet (80 mg total) by mouth daily. (Patient taking differently: Take 40 mg by mouth daily.)   loratadine (CLARITIN) 10 MG tablet Take 1 tablet (10 mg total) by mouth daily.   lovastatin (MEVACOR) 40 MG tablet Take 1 tablet (40 mg total) by mouth at bedtime.   metoprolol (TOPROL-XL) 200 MG 24 hr tablet Take 1 tablet by mouth once daily   Multiple Vitamins-Minerals (MULTIVITAMIN WITH MINERALS) tablet Take 1 tablet by mouth every other day.   traZODone (DESYREL) 50 MG tablet Take 0.5-1 tablets (25-50 mg total) by mouth at bedtime as needed for sleep.   No facility-administered encounter medications on file as of 03/09/2023.    Allergies (verified) Anesthesia s-i-40 [propofol], Codeine, Olmesartan, Amlodipine, Naproxen, and Tamiflu [oseltamivir]   History: Past Medical History:  Diagnosis Date   Ectopic pregnancy    Heart murmur    Hyperlipidemia    Hypertension    Insomnia, unspecified    Osteopenia 03/12/2021   Right knee pain    SVT (supraventricular tachycardia)     Past Surgical History:  Procedure Laterality Date   ABDOMINAL HYSTERECTOMY     BREAST CYST EXCISION Left    67 years old   COLONOSCOPY  2016   [facility]; 1 tubular adenoma, 1 hyperplastic polyp, TI biopsy with reactive lymphoid hyperplasia without active or chronic enteritis.   COLONOSCOPY N/A 12/02/2021   Procedure: COLONOSCOPY;  Surgeon: Corbin Ade, MD;  Location: [facility];  Service: Endoscopy;  Laterality: N/A;  7:30am   cyst left breast      SALPINGECTOMY Left    TMJ ARTHROPLASTY     Family History  Problem Relation Age of Onset   Diabetes Mother    Heart disease Mother        used nitroglycerin   Hypertension Mother    Thyroid disease Mother    Pancreatic cancer Mother    Hypertension Father    Diabetes Sister    Hypertension Sister    Colon cancer Sister        89   Hypertension Sister    Colon cancer Sister        36s   Hypertension Brother    Thyroid disease Brother    Diabetes Brother    Hypertension Brother    COPD Brother    Heart disease Brother    Stroke Brother    COPD Brother    Lung disease Brother  Stroke Maternal Grandmother    Breast cancer Neg Hx    Social History   Socioeconomic History   Marital status: Divorced    Spouse name: Not on file   Number of children: 1   Years of education: Not on file   Highest education level: Not on file  Occupational History   Occupation: Retired  Tobacco Use   Smoking status: Never   Smokeless tobacco: Never  Vaping Use   Vaping Use: Never used  Substance and Sexual Activity   Alcohol use: Never   Drug use: Never   Sexual activity: Not Currently    Birth control/protection: Surgical  Other Topics Concern   Not on file  Social History Narrative   Lives alone. Has one son - lives in [place].   Social Determinants of Health   Financial Resource Strain: Low Risk  (06/14/2022)   Overall Financial Resource Strain (CARDIA)    Difficulty of Paying Living Expenses: Not hard at all  Food  Insecurity: No Food Insecurity (06/14/2022)   Hunger Vital Sign    Worried About Running Out of Food in the Last Year: Never true    Ran Out of Food in the Last Year: Never true  Transportation Needs: No Transportation Needs (06/14/2022)   PRAPARE - Administrator, Civil Service (Medical): No    Lack of Transportation (Non-Medical): No  Physical Activity: Sufficiently Active (06/14/2022)   Exercise Vital Sign    Days of Exercise per Week: 5 days    Minutes of Exercise per Session: 30 min  Stress: No Stress Concern Present (06/14/2022)   [employer] of Occupational Health - Occupational Stress Questionnaire    Feeling of Stress : Only a little  Social Connections: Moderately Isolated (06/14/2022)   Social Connection and Isolation Panel [NHANES]    Frequency of Communication with Friends and Family: More than three times a week    Frequency of Social Gatherings with Friends and Family: Three times a week    Attends Religious Services: More than 4 times per year    Active Member of Clubs or Organizations: No    Attends Banker Meetings: Never    Marital Status: Divorced    Tobacco Counseling Counseling given: Not Answered   Clinical Intake:                 Diabetic?No          Activities of Daily Living    06/14/2022   10:33 AM  In your present state of health, do you have any difficulty performing the following activities:  Hearing? 0  Vision? 0  Difficulty concentrating or making decisions? 0  Walking or climbing stairs? 1  Dressing or bathing? 0  Doing errands, shopping? 0  Preparing Food and eating ? N  Using the Toilet? N  In the past six months, have you accidently leaked urine? N  Do you have problems with loss of bowel control? N  Managing your Medications? N  Managing your Finances? N  Housekeeping or managing your Housekeeping? N    Patient Care Team: Sonny Masters, [name] as PCP - General (Family Medicine) Jena Gauss  Gerrit Friends, MD as Consulting Physician (Gastroenterology)  Indicate any recent Medical Services you may have received from other than [facility] providers in the past year (date may be approximate).     Assessment:   This is a routine wellness examination for Kristine Mata.  Hearing/Vision screen No results found.  Dietary issues and exercise  activities discussed:     Goals Addressed   None    Depression Screen    12/23/2022   10:41 AM 10/14/2022   11:39 AM 09/02/2022    3:49 PM 08/16/2022    9:42 AM 06/22/2022    2:00 PM 06/14/2022   10:29 AM 04/14/2022    9:45 AM  PHQ 2/9 Scores  PHQ - 2 Score PHQ- 9 Score Fall Risk    12/23/2022   10:41 AM 10/14/2022   11:39 AM 09/02/2022    3:49 PM 08/16/2022    9:41 AM 06/22/2022    1:59 PM  Fall Risk   Falls in the past year? Number falls in past yr: 0 1 1 0 0  Injury with Fall? 0 0 0 0 1  Risk for fall due to :    History of fall(s) Impaired balance/gait  Follow up Falls prevention discussed Falls prevention discussed Falls prevention discussed Falls evaluation completed Falls evaluation completed    FALL RISK PREVENTION PERTAINING TO THE HOME:  Any stairs in or around the home? {YES/NO:21197} If so, are there any without handrails? {YES/NO:21197} Home free of loose throw rugs in walkways, pet beds, electrical cords, etc? {YES/NO:21197} Adequate lighting in your home to reduce risk of falls? {YES/NO:21197}  ASSISTIVE DEVICES UTILIZED TO PREVENT FALLS:  Life alert? {YES/NO:21197} Use of a cane, walker or w/c? {YES/NO:21197} Grab bars in the bathroom? {YES/NO:21197} Shower chair or bench in shower? {YES/NO:21197} Elevated toilet seat or a handicapped toilet? {YES/NO:21197}  TIMED UP AND GO:  Was the test performed? No . Telephonic visit   Cognitive Function:        06/14/2022   10:34 AM 06/11/2021   11:19 AM  6CIT Screen  What Year? 0 points 0 points  What month? 0 points 0 points  What  time? 0 points 0 points  Count back from 20 0 points 0 points  Months in reverse 0 points 0 points  Repeat phrase 0 points 0 points  Total Score 0 points 0 points    Immunizations Immunization History  Administered Date(s) Administered   Influenza,inj,Quad PF,6+ Mos 09/06/2017   Influenza-Unspecified 10/14/2015, 08/06/2016, 08/15/2018   Tdap 11/15/2016    TDAP status: Up to date  Flu Vaccine status: Up to date  {Pneumococcal vaccine status:2101807}  Covid-19 vaccine status: Information provided on how to obtain vaccines.   Qualifies for Shingles Vaccine? Yes   Zostavax completed No   Shingrix Completed?: No.    Education has been provided regarding the importance of this vaccine. Patient has been advised to call insurance company to determine out of pocket expense if they have not yet received this vaccine. Advised may also receive vaccine at local pharmacy or Health Dept. Verbalized acceptance and understanding.  Screening Tests Health Maintenance  Topic Date Due   COVID-19 Vaccine (1) Never done   Zoster Vaccines- Shingrix (1 of 2) 03/23/2023 (Originally 01/12/2005)   Pneumonia Vaccine 48+ Years old (1 of 1 - PCV) 06/23/2023 (Originally 01/13/2020)   DEXA SCAN  03/13/2023   Medicare Annual Wellness (AWV)  06/15/2023   INFLUENZA VACCINE  06/16/2023   MAMMOGRAM  09/09/2024   DTaP/Tdap/Td (2 - Td or Tdap) 11/15/2026   COLONOSCOPY (Pts 45-77yrs Insurance coverage will need to be confirmed)  12/02/2026   Hepatitis C Screening  Completed   HPV VACCINES  Aged Out  Health Maintenance  Health Maintenance Due  Topic Date Due   COVID-19 Vaccine (1) Never done    Colorectal cancer screening: Type of screening: Colonoscopy. Completed 12/02/21. Repeat every 5 years  Mammogram status: Completed 09/09/22. Repeat every year (scheduled for 09/19/23)  Bone Density status: Completed 03/12/21. Results reflect: Bone density results: OSTEOPENIA. Repeat every 2 years.  Lung Cancer  Screening: (Low Dose CT Chest recommended if Age 72-80 years, 30 pack-year currently smoking OR have quit w/in 15years.) does not qualify.   Lung Cancer Screening Referral: n/a  Additional Screening:  Hepatitis C Screening: does qualify; Completed 08/27/15  Vision Screening: Recommended annual ophthalmology exams for early detection of glaucoma and other disorders of the eye. Is the patient up to date with their annual eye exam?  {YES/NO:21197} Who is the provider or what is the name of the office in which the patient attends annual eye exams? *** If pt is not established with a provider, would they like to be referred to a provider to establish care? {YES/NO:21197}.   Dental Screening: Recommended annual dental exams for proper oral hygiene  Community Resource Referral / Chronic Care Management: CRR required this visit?  {YES/NO:21197}  CCM required this visit?  {YES/NO:21197}     Plan:     I have personally reviewed and noted the following in the patient's chart:   Medical and social history Use of alcohol, tobacco or illicit drugs  Current medications and supplements including opioid prescriptions. {Opioid [phone]} Functional ability and status Nutritional status Physical activity Advanced directives List of other physicians Hospitalizations, surgeries, and ER visits in previous 12 months Vitals Screenings to include cognitive, depression, and falls Referrals and appointments  In addition, I have reviewed and discussed with patient certain preventive protocols, quality metrics, and best practice recommendations. A written personalized care plan for preventive services as well as general preventive health recommendations were provided to patient.     Durwin Nora, [place]   04/18/5408   Due to this being a virtual visit, the after visit summary with patients personalized plan was offered to patient via mail or my-chart. ***Patient declined at  this time./ Patient would like to access on my-chart/ per request, patient was mailed a copy of AVS./ Patient preferred to pick up at office at next visit  Nurse Notes: ***

## 2023-03-09 ENCOUNTER — Ambulatory Visit (INDEPENDENT_AMBULATORY_CARE_PROVIDER_SITE_OTHER): Payer: Medicare HMO

## 2023-03-09 VITALS — Ht 70.0 in | Wt 307.0 lb

## 2023-03-09 DIAGNOSIS — Z0100 Encounter for examination of eyes and vision without abnormal findings: Secondary | ICD-10-CM

## 2023-03-09 DIAGNOSIS — Z0000 Encounter for general adult medical examination without abnormal findings: Secondary | ICD-10-CM | POA: Diagnosis not present

## 2023-03-10 ENCOUNTER — Telehealth: Payer: Self-pay | Admitting: *Deleted

## 2023-03-10 NOTE — Progress Notes (Signed)
  Care Coordination   Note   03/10/2023 Name: Kristine Mata MRN: [id number] DOB: June 13, 1956  Kristine Mata is a 67 y.o. year old female who sees Rakes, Doralee Albino, [name] for primary care. I reached out to Geoffry Paradise by phone today to offer care coordination services.  [name] was given information about Care Coordination services today including:   The Care Coordination services include support from the care team which includes your Nurse Coordinator, Clinical Social Worker, or Pharmacist.  The Care Coordination team is here to help remove barriers to the health concerns and goals most important to you. Care Coordination services are voluntary, and the patient may decline or stop services at any time by request to their care team member.   Care Coordination Consent Status: Patient agreed to services and verbal consent obtained.   Follow up plan:  Telephone appointment with care coordination team member scheduled for:  03/17/23  Encounter Outcome:  Pt. Scheduled  [facility] Coordination Care Guide  Direct Dial: [phone]

## 2023-03-14 DIAGNOSIS — R0789 Other chest pain: Secondary | ICD-10-CM | POA: Diagnosis not present

## 2023-03-14 NOTE — Telephone Encounter (Signed)
Pt has another name and phone for ENDO referral Attention [facility]bo at [facility] [phone]

## 2023-03-16 ENCOUNTER — Encounter: Payer: Self-pay | Admitting: *Deleted

## 2023-03-16 ENCOUNTER — Ambulatory Visit: Payer: Self-pay | Admitting: *Deleted

## 2023-03-16 NOTE — Patient Instructions (Signed)
Visit Information  Thank you for taking time to visit with me today. Please don't hesitate to contact me if I can be of assistance to you.   Following are the goals we discussed today:   Goals Addressed               This Visit's Progress     Receive Assistance Applying for Medicaid and Personal Care Services. (pt-stated)   On track     Care Coordination Interventions:  Interventions Today    Flowsheet Row Most Recent Value  Chronic Disease   Chronic disease during today's visit Hypertension (HTN), Other  [Osteopenia, Morbid Obesity, Unsteady Balance/Gait, Chronic Right Knee Pain & Inability to Perform Activities of Daily Living Independently]  General Interventions   General Interventions Discussed/Reviewed General Interventions Discussed, Labs, Vaccines, Doctor Visits, Communication with, Level of Care, [facility], Health Screening, Annual Foot Exam, General Interventions Reviewed, Horticulturist, commercial (DME), Annual Eye Exam  Labs Hgb A1c annually  [Encouraged]  Vaccines COVID-19, Flu, Pneumonia, RSV, Shingles, Tetanus/Pertussis/Diphtheria  [Encouraged]  Doctor Visits Discussed/Reviewed Doctor Visits Discussed, Doctor Visits Reviewed, Annual Wellness Visits, PCP, Specialist  [Encouraged]  Health Screening Bone Density, Colonoscopy, Mammogram  [Encouraged]  PCP/Specialist Visits Compliance with follow-up visit  [Encouraged]  Communication with PCP/Specialists  Level of Care Adult Daycare, Personal Care Services, Applications, Assisted Living, Skilled Nursing Facility  [Encouraged]  Applications Medicaid, Personal Care Services  [Encouraged]  Exercise Interventions   Exercise Discussed/Reviewed Exercise Discussed, Exercise Reviewed, Assistive device use and maintanence, Physical Activity  [Encouraged]  Physical Activity Discussed/Reviewed Physical Activity Discussed, Physical Activity Reviewed, Types of exercise, Home Exercise Program (HEP)  [Encouraged]  Education  Interventions   Education Provided Provided Therapist, sports, Provided Web-based Education, Provided Education  Provided Verbal Education On Nutrition, Mental Health/Coping with Illness, Applications, Exercise, Medication, Development worker, community, [facility], When to see the doctor, Eye Care  [Encouraged]  Applications Medicaid, Personal Care Services  [Encouraged]  Mental Health Interventions   Mental Health Discussed/Reviewed Mental Health Discussed, Anxiety, Depression, Grief and Loss, Mental Health Reviewed, Coping Strategies, Substance Abuse, Suicide, Crisis, Other  [Domestic Violence]  Nutrition Interventions   Nutrition Discussed/Reviewed Adding fruits and vegetables, Nutrition Discussed, Nutrition Reviewed, Fluid intake, Increaing proteins, Decreasing fats, Decreasing salt, Carbohydrate meal planning, Portion sizes, Decreasing sugar intake  [Encouraged]  Pharmacy Interventions   Pharmacy Dicussed/Reviewed Pharmacy Topics Discussed, Medication Adherence, Pharmacy Topics Reviewed, Affording Medications  [Encouraged]  Safety Interventions   Safety Discussed/Reviewed Safety Discussed, Safety Reviewed, Fall Risk, Home Safety  [Encouraged]  Home Safety Assistive Devices, Need for home safety assessment, Refer for community resources  [Encouraged]  Advanced Directive Interventions   Advanced Directives Discussed/Reviewed Advanced Directives Discussed, Advanced Directives Reviewed, Provided resource for acquiring and filling out documents  [Encouraged]     Assessed Social Determinant of Health Barriers. Discussed Plans for Ongoing Care Management Follow Up. Provided Careers information officer Information for Care Management Team Members. Screened for Signs & Symptoms of Depression, Related to Chronic Disease State.  PHQ2 & PHQ9 Depression Screen Completed & Results Reviewed.  Suicidal Ideation & Homicidal Ideation Assessed - None Present.   Domestic Violence Assessed - None Present. Access to Weapons  Assessed - None Present.   Active Listening & Reflection Utilized.  Verbalization of Feelings Encouraged.  Emotional Support Provided. Feelings of Caregiver Burnout Validated. Caregiver Stress Acknowledged. Caregiver Resources Reviewed. Caregiver Support Groups Mailed. Self-Enrollment in Caregiver Support Group of Interest Emphasized. Crisis Support Information, Agencies, Services & Resources Discussed. Problem Solving Interventions Identified. Task-Centered Solutions  Implemented.   Solution-Focused Strategies Developed. Acceptance & Commitment Therapy Introduced. Brief Cognitive Behavioral Therapy Initiated. Client-Centered Therapy Enacted. Reviewed Prescription Medications & Discussed Importance of Compliance. Quality of Sleep Assessed & Sleep Hygiene Techniques Promoted. Discussed Higher Level of Care Options (I.e. Memory Care Assisted Living Facility, Extended Care Facility, Skilled Nursing Facility) & Encouraged Consideration. Verified No In-Home Care Services, [employer], Warden/ranger, Etc., Covered Under Firefighter through [employer].  Reviewed Materials engineer through [facility] & Encouraged Completion of Medicaid Application. Verified No Long-Term Care Insurance Benefits, Secondary Insurance Policies, Plans, Coverage, Etc.  Confirmed Patient, Nor Ex-Husband Were Veterans, Making Her Ineligible to Apply for Aid & Attendance Benefits, Through [employer]. Please Review The Following List of [employer], [employer], [employer], Mailed on 03/16/2023: ~ 2023 Medicaid Tips ~ Medicaid Application ~ Making Medicaid Accessible: The [employer] Assistance for Patients  ~ Neurosurgeon in [place] ~ Merchant navy officer ~ Development worker, international aid Providers ~ Advanced Directives ~ Transportation Options Encouraged Completion of Application for  [employer] & Submission to The [employer] of Social Services [phone]), for Processing. Encouraged Completion of Application for Personal Care Services & Submission to [facility] [phone]) for Processing, Once Approved for Medicaid, through The [facility] of Social Services [phone]). Encouraged Completion of Advanced Directives (Living Will & Healthcare Power of Attorney Documents) & Submission to Primary Care Provider, Family Nurse Practitioner, Gilford Silvius with  [facility] [phone]), to Scan into Electronic Medical Record in Epic. Encouraged Careers information officer with CSW (# [phone]), if You Have Questions, Need Assistance, or If Additional Social Work Needs Are Identified Between Now & Our Next Scheduled Follow-Up Outreach Call.      Our next appointment is by telephone on 03/29/2023 at 2:45 pm.   Please call the care guide team at [phone] if you need to cancel or reschedule your appointment.   If you are experiencing a Mental Health or Behavioral Health Crisis or need someone to talk to, please call the Suicide and Crisis Lifeline: 988 call the [place] National Suicide Prevention Lifeline: 208-054-7475 or TTY: 671-515-5474 TTY (769)728-1698) to talk to a trained counselor call 1-800-273-TALK (toll free, 24 hour hotline) go to [facility] [address] [place] [phone]) call the [facility] Crisis Line: [phone] call 911  Patient verbalizes understanding of instructions and care plan provided today and agrees to view in MyChart. Active MyChart status and patient understanding of how to access instructions and care plan via MyChart confirmed with patient.     Telephone follow up appointment with care management team member scheduled for:  03/29/2023 at 2:45 pm.   Danford Bad, BSW, MSW, LCSW  Licensed Clinical Social Worker   [facility]  Mailing [place]. [address] [place], [place] [address] Physical Address-[address] [place], [place] [address] Toll Free Main # [phone] Fax # [phone] Cell # [phone] [email or web address]

## 2023-03-16 NOTE — Telephone Encounter (Signed)
Pt called stating that she gave [place] the wrong fax # to send referral to.  Correct fax # is [phone]

## 2023-03-16 NOTE — Patient Outreach (Signed)
Care Coordination   Initial Visit Note   03/16/2023  Name: Kristine Mata MRN: [id number] DOB: [birth year]  Kristine Mata is a 67 y.o. year old female who sees Rakes, Doralee Albino, [name] for primary care. I spoke with Geoffry Paradise by phone today.  What matters to the patients health and wellness today?  Receive Assistance Applying for Medicaid and Personal Care Services.   Goals Addressed               This Visit's Progress     Receive Assistance Applying for Medicaid and Personal Care Services. (pt-stated)   On track     Care Coordination Interventions:  Interventions Today    Flowsheet Row Most Recent Value  Chronic Disease   Chronic disease during today's visit Hypertension (HTN), Other  [Osteopenia, Morbid Obesity, Unsteady Balance/Gait, Chronic Right Knee Pain & Inability to Perform Activities of Daily Living Independently]  General Interventions   General Interventions Discussed/Reviewed General Interventions Discussed, Labs, Vaccines, Doctor Visits, Communication with, Level of Care, [facility], Health Screening, Annual Foot Exam, General Interventions Reviewed, Horticulturist, commercial (DME), Annual Eye Exam  Labs Hgb A1c annually  [Encouraged]  Vaccines COVID-19, Flu, Pneumonia, RSV, Shingles, Tetanus/Pertussis/Diphtheria  [Encouraged]  Doctor Visits Discussed/Reviewed Doctor Visits Discussed, Doctor Visits Reviewed, Annual Wellness Visits, PCP, Specialist  [Encouraged]  Health Screening Bone Density, Colonoscopy, Mammogram  [Encouraged]  PCP/Specialist Visits Compliance with follow-up visit  [Encouraged]  Communication with PCP/Specialists  Level of Care Adult Daycare, Personal Care Services, Applications, Assisted Living, Skilled Nursing Facility  [Encouraged]  Applications Medicaid, Personal Care Services  [Encouraged]  Exercise Interventions   Exercise Discussed/Reviewed Exercise Discussed, Exercise Reviewed, Assistive device use and maintanence,  Physical Activity  [Encouraged]  Physical Activity Discussed/Reviewed Physical Activity Discussed, Physical Activity Reviewed, Types of exercise, Home Exercise Program (HEP)  [Encouraged]  Education Interventions   Education Provided Provided Therapist, sports, Provided Web-based Education, Provided Education  Provided Verbal Education On Nutrition, Mental Health/Coping with Illness, Applications, Exercise, Medication, Development worker, community, [facility], When to see the doctor, Eye Care  [Encouraged]  Applications Medicaid, Personal Care Services  [Encouraged]  Mental Health Interventions   Mental Health Discussed/Reviewed Mental Health Discussed, Anxiety, Depression, Grief and Loss, Mental Health Reviewed, Coping Strategies, Substance Abuse, Suicide, Crisis, Other  [Domestic Violence]  Nutrition Interventions   Nutrition Discussed/Reviewed Adding fruits and vegetables, Nutrition Discussed, Nutrition Reviewed, Fluid intake, Increaing proteins, Decreasing fats, Decreasing salt, Carbohydrate meal planning, Portion sizes, Decreasing sugar intake  [Encouraged]  Pharmacy Interventions   Pharmacy Dicussed/Reviewed Pharmacy Topics Discussed, Medication Adherence, Pharmacy Topics Reviewed, Affording Medications  [Encouraged]  Safety Interventions   Safety Discussed/Reviewed Safety Discussed, Safety Reviewed, Fall Risk, Home Safety  [Encouraged]  Home Safety Assistive Devices, Need for home safety assessment, Refer for community resources  [Encouraged]  Advanced Directive Interventions   Advanced Directives Discussed/Reviewed Advanced Directives Discussed, Advanced Directives Reviewed, Provided resource for acquiring and filling out documents  [Encouraged]     Assessed Social Determinant of Health Barriers. Discussed Plans for Ongoing Care Management Follow Up. Provided Careers information officer Information for Care Management Team Members. Screened for Signs & Symptoms of Depression, Related to Chronic Disease  State.  PHQ2 & PHQ9 Depression Screen Completed & Results Reviewed.  Suicidal Ideation & Homicidal Ideation Assessed - None Present.   Domestic Violence Assessed - None Present. Access to Weapons Assessed - None Present.   Active Listening & Reflection Utilized.  Verbalization of Feelings Encouraged.  Emotional Support Provided. Feelings of Caregiver Burnout Validated.  Caregiver Stress Acknowledged. Caregiver Resources Reviewed. Caregiver Support Groups Mailed. Self-Enrollment in Caregiver Support Group of Interest Emphasized. Crisis Support Information, Agencies, Services & Resources Discussed. Problem Solving Interventions Identified. Task-Centered Solutions Implemented.   Solution-Focused Strategies Developed. Acceptance & Commitment Therapy Introduced. Brief Cognitive Behavioral Therapy Initiated. Client-Centered Therapy Enacted. Reviewed Prescription Medications & Discussed Importance of Compliance. Quality of Sleep Assessed & Sleep Hygiene Techniques Promoted. Discussed Higher Level of Care Options (I.e. Memory Care Assisted Living Facility, Extended Care Facility, Skilled Nursing Facility) & Encouraged Consideration. Verified No In-Home Care Services, [employer], Warden/ranger, Etc., Covered Under Firefighter through [employer].  Reviewed Materials engineer through [facility] & Encouraged Completion of Medicaid Application. Verified No Long-Term Care Insurance Benefits, Secondary Insurance Policies, Plans, Coverage, Etc.  Confirmed Patient, Nor Ex-Husband Were Veterans, Making Her Ineligible to Apply for Aid & Attendance Benefits, Through [employer]. Please Review The Following List of [employer], [employer], [employer], Mailed on 03/16/2023: ~ 2023 Medicaid Tips ~ Medicaid Application ~ Making Medicaid Accessible: The [employer] Assistance for Patients  ~ Metallurgist in [place] ~ Merchant navy officer ~ Development worker, international aid Providers ~ Advanced Directives ~ Transportation Options Encouraged Completion of Application for [employer] & Submission to The [employer] of Social Services [phone]), for Processing. Encouraged Completion of Application for Personal Care Services & Submission to [facility] [phone]) for Processing, Once Approved for Medicaid, through The [facility] of Social Services [phone]). Encouraged Completion of Advanced Directives (Living Will & Healthcare Power of Attorney Documents) & Submission to Primary Care Provider, Family Nurse Practitioner, Gilford Silvius with [facility] [phone]), to Scan into Electronic Medical Record in Epic. Encouraged Careers information officer with CSW (# [phone]), if You Have Questions, Need Assistance, or If Additional Social Work Needs Are Identified Between Now & Our Next Scheduled Follow-Up Outreach Call.        SDOH assessments and interventions completed:  Yes.  SDOH Interventions Today    Flowsheet Row Most Recent Value  SDOH Interventions   Food Insecurity Interventions Intervention Not Indicated  Housing Interventions Intervention Not Indicated  Transportation Interventions Intervention Not Indicated, Patient Resources (Friends/Family)  Utilities Interventions Intervention Not Indicated  Alcohol Usage Interventions Intervention Not Indicated (Score <7)  Financial Strain Interventions Intervention Not Indicated  Physical Activity Interventions Patient Refused  Stress Interventions Intervention Not Indicated  Social Connections Interventions Patient Refused     Care Coordination Interventions:  Yes, provided.   Follow up plan: Follow up call scheduled for 03/29/2023 at 2:45 pm.   Encounter Outcome:  Pt. Visit Completed.   Danford Bad, BSW, MSW, LCSW   Licensed Restaurant manager, fast food [facility]  Mailing [place] [address], [place], [place] [address] Physical [address], [place], [place] [address] Toll Free Main # [phone] Fax # [phone] Cell # [phone] [name]

## 2023-03-17 ENCOUNTER — Encounter: Payer: Self-pay | Admitting: *Deleted

## 2023-03-18 ENCOUNTER — Ambulatory Visit: Payer: Medicare HMO | Admitting: Family Medicine

## 2023-03-21 ENCOUNTER — Telehealth: Payer: Self-pay

## 2023-03-21 ENCOUNTER — Other Ambulatory Visit (INDEPENDENT_AMBULATORY_CARE_PROVIDER_SITE_OTHER): Payer: Medicare HMO

## 2023-03-21 DIAGNOSIS — E210 Primary hyperparathyroidism: Secondary | ICD-10-CM

## 2023-03-21 LAB — TSH: TSH: 0.5 u[IU]/mL (ref 0.35–5.50)

## 2023-03-21 LAB — T4, FREE: Free T4: 0.88 ng/dL (ref 0.60–1.60)

## 2023-03-21 LAB — T3, FREE: T3, Free: 3 pg/mL (ref 2.3–4.2)

## 2023-03-21 NOTE — Telephone Encounter (Signed)
Orders Placed This Encounter  Procedures   T4, free    Standing Status:   Future    Standing Expiration Date:   09/21/2023   T3, free    Standing Status:   Future    Standing Expiration Date:   09/21/2023   TSH    Standing Status:   Future    Standing Expiration Date:   03/20/2024   TRAb (TSH Receptor Binding Antibody)    Standing Status:   Future    Standing Expiration Date:   09/21/2023   Thyroid stimulating immunoglobulin    Standing Status:   Future    Standing Expiration Date:   09/21/2023

## 2023-03-22 LAB — TRAB (TSH RECEPTOR BINDING ANTIBODY): TRAB: <1 IU/L (ref <=2.00)

## 2023-03-23 ENCOUNTER — Encounter: Payer: Self-pay | Admitting: "Endocrinology

## 2023-03-23 ENCOUNTER — Ambulatory Visit: Payer: Medicare HMO | Admitting: "Endocrinology

## 2023-03-23 VITALS — BP 138/84 | HR 88 | Ht 70.0 in | Wt 314.4 lb

## 2023-03-23 DIAGNOSIS — E210 Primary hyperparathyroidism: Secondary | ICD-10-CM

## 2023-03-23 DIAGNOSIS — M85851 Other specified disorders of bone density and structure, right thigh: Secondary | ICD-10-CM | POA: Diagnosis not present

## 2023-03-23 LAB — TIQ- MISLABELED: DATE RECEIVED:: 5082024

## 2023-03-23 LAB — THYROID STIMULATING IMMUNOGLOBULIN: TSI: <89 % baseline (ref <140)

## 2023-03-23 NOTE — Progress Notes (Addendum)
Outpatient Endocrinology Note Kristine Jericho, MD    Kristine Mata December 21, 1955 [id number]  Referring Provider: Sonny Masters, [name] Primary Care Provider: Sonny Masters, [name] Reason for consultation: Subjective   Assessment & Plan  Diagnoses and all orders for this visit:  Hyperparathyroidism, primary (HCC) -     PTH, intact and calcium; Future -     Vitamin D 1,25 dihydroxy; Future -     VITAMIN D 25 Hydroxy (Vit-D Deficiency, Fractures); Future -     Renal function panel; Future -     Renal function panel -     VITAMIN D 25 Hydroxy (Vit-D Deficiency, Fractures) -     Vitamin D 1,25 dihydroxy -     PTH, intact and calcium -     DG BONE DENSITY (DXA); Future  Hypercalcemia -     DG BONE DENSITY (DXA); Future  Osteopenia of neck of right femur   Primary hyperparathyroidism, c/o intermittent abdominal pain, osteopenia per 2022 DXA 24 hr urine Ca 12/2022 was 333 (no total urine mentioned)  02/2023 NM PARATHYROID SCINTIGRAPHY AND SPECT IMAGING  showed activity localized to the inferior posterior LEFT lobe of thyroid 02/2021 DXA showed T score -1.2 right femur    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 Patient is not keen on surgery No indication for surgery currently  Pt wants to monitor calcium clinically  Ordered DXA and labs  Discussed importance of good hydration to prevent severe hypercalcemia:  - 8 eight ounce glasses of fluids per day.  - low threshold for ER visit for IV fluids if having     nausea/vomiting/diarrhea and cannot keep well hydrated.   Return in about 3 months (around 06/23/2023) for visit, labs now and before next visit.   I have reviewed current medications, nurse's notes, allergies, vital signs, past medical and surgical history, family medical history, and social history for this encounter. Counseled patient on symptoms, examination findings, lab findings, imaging results, treatment decisions and monitoring and prognosis. The patient  understood the recommendations and agrees with the treatment plan. All questions regarding treatment plan were fully answered.  Kristine , MD  03/23/23   History of Present Illness HPI  Kristine Mata is a 67 y.o. female referred by Dr. Reginia Forts for evaluation and management of hypercalcemia.    She was found to have elevated serum calcium of 10.4 (corrected for 12/2022) mg/dL in with a high intact PTH level of 108 pg/ml. Patient has had a high Calcium at least since 08/2021.    Patient denies a history of kidney stones.  She  current hematuria No Polyuria No Nocturia Once a night  Excessive Thirst  renal failure No Anorexia No abdominal pain yes, twice a week Heartburn No constipation sometimes  nausea or vomiting no  h/o peptic ulcer disease no  depression no  confusion no excessive fatigue no fracture yes, ground level in 2015-2016 osteoporosis no, osteopenia  Headaches sometimes Numbness no Tingling yes  She  take Calcium No She  take Vitamin D supplements No  She denies a history of taking chronic lithium No She denies a history of thiazide diuretics.   She denies self/family history of renal stones/hypercalcemia (except niece) denies a personal history of MEN syndromes/medullary thyroid cancer/ pheochromocytoma  Physical Exam  BP 138/84 (BP Location: Left Arm, Patient Position: Sitting, Cuff Size: Normal)   Pulse 88   Ht 5\' 10"  (1.778 m)   Wt (!) 314 lb 6.4 oz (142.6 kg)  SpO2 98%   BMI 45.11 kg/m    Constitutional: well developed, well nourished Head: normocephalic, atraumatic Eyes: sclera anicteric, no redness Neck: supple Lungs: normal respiratory effort Neurology: alert and oriented Skin: dry, no appreciable rashes Musculoskeletal: no appreciable defects Psychiatric: normal mood and affect   Current Medications Patient's Medications  New Prescriptions   No medications on file  Previous Medications   CETIRIZINE (ZYRTEC) 10 MG TABLET     Take 10 mg by mouth daily.   FLUTICASONE (FLONASE) 50 MCG/ACT NASAL SPRAY    Place 2 sprays into both nostrils daily.   FUROSEMIDE (LASIX) 80 MG TABLET    Take 1 tablet (80 mg total) by mouth daily.   LORATADINE (CLARITIN) 10 MG TABLET    Take 1 tablet (10 mg total) by mouth daily.   LOVASTATIN (MEVACOR) 40 MG TABLET    Take 1 tablet (40 mg total) by mouth at bedtime.   METOPROLOL (TOPROL-XL) 200 MG 24 HR TABLET    Take 1 tablet by mouth once daily   MULTIPLE VITAMINS-MINERALS (MULTIVITAMIN WITH MINERALS) TABLET    Take 1 tablet by mouth every other day.   TRAZODONE (DESYREL) 50 MG TABLET    Take 0.5-1 tablets (25-50 mg total) by mouth at bedtime as needed for sleep.  Modified Medications   No medications on file  Discontinued Medications   No medications on file    Allergies Allergies  Allergen Reactions   Anesthesia S-I-40 [Propofol] Nausea And Vomiting   Codeine Nausea And Vomiting   Olmesartan     Edema in feet   Amlodipine Other (See Comments)    Edema at 2.5 mg dosage.   Naproxen Palpitations   Tamiflu [Oseltamivir] Rash and Cough    Rash, Cough, and Wheezing    Past Medical History Past Medical History:  Diagnosis Date   Ectopic pregnancy    Heart murmur    Hyperlipidemia    Hypertension    Insomnia, unspecified    Osteopenia 03/12/2021   Right knee pain    SVT (supraventricular tachycardia)     Past Surgical History Past Surgical History:  Procedure Laterality Date   ABDOMINAL HYSTERECTOMY     BREAST CYST EXCISION Left    67 years old   COLONOSCOPY  2016   [facility]; 1 tubular adenoma, 1 hyperplastic polyp, TI biopsy with reactive lymphoid hyperplasia without active or chronic enteritis.   COLONOSCOPY N/A 12/02/2021   Procedure: COLONOSCOPY;  Surgeon: Corbin Ade, MD;  Location: [facility];  Service: Endoscopy;  Laterality: N/A;  7:30am   cyst left breast      SALPINGECTOMY Left    TMJ ARTHROPLASTY      Family History family history includes COPD in  her brother and brother; Colon cancer in her sister and sister; Diabetes in her brother, mother, and sister; Heart disease in her brother and mother; Hypertension in her brother, brother, father, mother, sister, and sister; Lung disease in her brother; Pancreatic cancer in her mother; Stroke in her brother and maternal grandmother; Thyroid disease in her brother and mother.  Social History Social History   Socioeconomic History   Marital status: Divorced    Spouse name: Not on file   Number of children: 1   Years of education: 12   Highest education level: 12th grade  Occupational History   Occupation: Retired  Tobacco Use   Smoking status: Never    Passive exposure: Never   Smokeless tobacco: Never  Vaping Use   Vaping Use:  Never used  Substance and Sexual Activity   Alcohol use: Never   Drug use: Never   Sexual activity: Not Currently    Partners: Male    Birth control/protection: Surgical  Other Topics Concern   Not on file  Social History Narrative   Lives alone. Has one son - lives in [place].   Social Determinants of Health   Financial Resource Strain: Low Risk  (03/16/2023)   Overall Financial Resource Strain (CARDIA)    Difficulty of Paying Living Expenses: Not hard at all  Food Insecurity: No Food Insecurity (03/16/2023)   Hunger Vital Sign    Worried About Running Out of Food in the Last Year: Never true    Ran Out of Food in the Last Year: Never true  Transportation Needs: No Transportation Needs (03/16/2023)   PRAPARE - Administrator, Civil Service (Medical): No    Lack of Transportation (Non-Medical): No  Physical Activity: Inactive (03/16/2023)   Exercise Vital Sign    Days of Exercise per Week: 0 days    Minutes of Exercise per Session: 0 min  Stress: No Stress Concern Present (03/16/2023)   [employer] of Occupational Health - Occupational Stress Questionnaire    Feeling of Stress : Only a little  Social Connections: Moderately Isolated  (03/16/2023)   Social Connection and Isolation Panel [NHANES]    Frequency of Communication with Friends and Family: More than three times a week    Frequency of Social Gatherings with Friends and Family: More than three times a week    Attends Religious Services: More than 4 times per year    Active Member of Clubs or Organizations: No    Attends Banker Meetings: Never    Marital Status: Divorced  Catering manager Violence: Not At Risk (03/16/2023)   Humiliation, Afraid, Rape, and Kick questionnaire    Fear of Current or Ex-Partner: No    Emotionally Abused: No    Physically Abused: No    Sexually Abused: No    Lab Results  Component Value Date   CHOL 184 12/23/2022   Lab Results  Component Value Date   HDL 72 12/23/2022   Lab Results  Component Value Date   LDLCALC 94 12/23/2022   Lab Results  Component Value Date   TRIG 99 12/23/2022   Lab Results  Component Value Date   CHOLHDL 2.6 12/23/2022   Lab Results  Component Value Date   CREATININE 0.62 12/23/2022   No results found for: "GFR"    Component Value Date/Time   NA 142 12/23/2022 1110   K 4.3 12/23/2022 1110   CL 104 12/23/2022 1110   CO2 24 12/23/2022 1110   GLUCOSE 110 (H) 12/23/2022 1110   GLUCOSE 129 (H) 08/04/2022 2143   BUN 12 12/23/2022 1110   CREATININE 0.62 12/23/2022 1110   CALCIUM 10.7 (H) 12/23/2022 1110   PROT 7.0 12/23/2022 1110   ALBUMIN 4.4 12/23/2022 1110   AST 21 12/23/2022 1110   ALT 15 12/23/2022 1110   ALKPHOS 167 (H) 12/23/2022 1110   BILITOT 0.3 12/23/2022 1110   GFRNONAA >60 08/04/2022 2143   GFRAA 106 07/15/2020 1419      Latest Ref Rng & Units 12/23/2022   11:10 AM 12/14/2022   10:09 AM 10/14/2022   11:46 AM  BMP  Glucose 70 - 99 mg/dL 409   811   BUN 8 - 27 mg/dL 12   9   Creatinine 9.14 - 1.00 mg/dL 7.82  0.65   BUN/Creat Ratio 12 - 28 19   14    Sodium 134 - 144 mmol/L 142   141   Potassium 3.5 - 5.2 mmol/L 4.3   4.2   Chloride 96 - 106 mmol/L 104    102   CO2 20 - 29 mmol/L 24   26   Calcium 8.7 - 10.3 mg/dL 40.9  81.1  91.4        Component Value Date/Time   WBC 6.2 12/23/2022 1110   WBC 7.8 08/04/2022 2143   RBC 4.50 12/23/2022 1110   RBC 4.46 08/04/2022 2143   HGB 12.6 12/23/2022 1110   HCT 39.7 12/23/2022 1110   PLT 332 12/23/2022 1110   MCV 88 12/23/2022 1110   MCH 28.0 12/23/2022 1110   MCH 29.4 08/04/2022 2143   MCHC 31.7 12/23/2022 1110   MCHC 32.1 08/04/2022 2143   RDW 12.8 12/23/2022 1110   LYMPHSABS 2.5 12/23/2022 1110   MONOABS 0.6 08/04/2022 2143   EOSABS 0.1 12/23/2022 1110   BASOSABS 0.0 12/23/2022 1110   Lab Results  Component Value Date   TSH 0.50 03/21/2023   TSH 0.578 12/23/2022   TSH 0.633 10/14/2022   FREET4 0.88 03/21/2023       Parts of this note may have been dictated using voice recognition software. There may be variances in spelling and vocabulary which are unintentional. Not all errors are proofread. Please notify the Thereasa Parkin if any discrepancies are noted or if the meaning of any statement is not clear.

## 2023-03-24 ENCOUNTER — Ambulatory Visit: Payer: Medicare HMO | Admitting: Internal Medicine

## 2023-03-24 LAB — RENAL FUNCTION PANEL
Albumin: 4.3 g/dL (ref 3.5–5.2)
BUN: 16 mg/dL (ref 6–23)
CO2: 27 mEq/L (ref 19–32)
Calcium: 10.7 mg/dL — ABNORMAL HIGH (ref 8.4–10.5)
Chloride: 104 mEq/L (ref 96–112)
Creatinine, Ser: 0.65 mg/dL (ref 0.40–1.20)
GFR: 90.61 mL/min (ref >60.00)
Glucose, Bld: 122 mg/dL — ABNORMAL HIGH (ref 70–99)
Phosphorus: 3.1 mg/dL (ref 2.3–4.6)
Potassium: 4 mEq/L (ref 3.5–5.1)
Sodium: 142 mEq/L (ref 135–145)

## 2023-03-24 LAB — VITAMIN D 25 HYDROXY (VIT D DEFICIENCY, FRACTURES): VITD: 22.09 ng/mL — ABNORMAL LOW (ref 30.00–100.00)

## 2023-03-27 LAB — VITAMIN D 1,25 DIHYDROXY
Vitamin D 1, 25 (OH)2 Total: 122 pg/mL — ABNORMAL HIGH (ref 18–72)
Vitamin D2 1, 25 (OH)2: <8 pg/mL

## 2023-03-28 LAB — PTH, INTACT AND CALCIUM: Calcium: 10.7 mg/dL — ABNORMAL HIGH (ref 8.6–10.4)

## 2023-03-28 LAB — TIQ- MISLABELED: Test Ordered On Req: 8837

## 2023-03-28 LAB — VITAMIN D 1,25 DIHYDROXY: Vitamin D3 1, 25 (OH)2: 122 pg/mL

## 2023-03-29 ENCOUNTER — Encounter: Payer: Self-pay | Admitting: *Deleted

## 2023-03-29 ENCOUNTER — Ambulatory Visit: Payer: Self-pay | Admitting: *Deleted

## 2023-03-29 NOTE — Patient Instructions (Signed)
Visit Information  Thank you for taking time to visit with me today. Please don't hesitate to contact me if I can be of assistance to you.   Following are the goals we discussed today:   Goals Addressed               This Visit's Progress     Receive Assistance Applying for Medicaid and Personal Care Services. (pt-stated)   On track     Care Coordination Interventions:  Interventions Today    Flowsheet Row Most Recent Value  Chronic Disease   Chronic disease during today's visit Hypertension (HTN), Other  [Osteopenia, Morbid Obesity, Unsteady Balance/Gait, Chronic Right Knee Pain & Inability to Perform Activities of Daily Living Independently]  General Interventions   General Interventions Discussed/Reviewed General Interventions Discussed, Labs, Vaccines, Doctor Visits, Communication with, Level of Care, [facility], Health Screening, Annual Foot Exam, General Interventions Reviewed, Horticulturist, commercial (DME), Annual Eye Exam  Labs Hgb A1c annually  [Encouraged]  Vaccines COVID-19, Flu, Pneumonia, RSV, Shingles, Tetanus/Pertussis/Diphtheria  [Encouraged]  Doctor Visits Discussed/Reviewed Doctor Visits Discussed, Doctor Visits Reviewed, Annual Wellness Visits, PCP, Specialist  [Encouraged]  Health Screening Bone Density, Colonoscopy, Mammogram  [Encouraged]  PCP/Specialist Visits Compliance with follow-up visit  [Encouraged]  Communication with PCP/Specialists  Level of Care Adult Daycare, Personal Care Services, Applications, Assisted Living, Skilled Nursing Facility  [Encouraged]  Applications Medicaid, Personal Care Services  [Encouraged]  Exercise Interventions   Exercise Discussed/Reviewed Exercise Discussed, Exercise Reviewed, Assistive device use and maintanence, Physical Activity  [Encouraged]  Physical Activity Discussed/Reviewed Physical Activity Discussed, Physical Activity Reviewed, Types of exercise, Home Exercise Program (HEP)  [Encouraged]  Education  Interventions   Education Provided Provided Therapist, sports, Provided Web-based Education, Provided Education  Provided Verbal Education On Nutrition, Mental Health/Coping with Illness, Applications, Exercise, Medication, Development worker, community, [facility], When to see the doctor, Eye Care  [Encouraged]  Applications Medicaid, Personal Care Services  [Encouraged]  Mental Health Interventions   Mental Health Discussed/Reviewed Mental Health Discussed, Anxiety, Depression, Grief and Loss, Mental Health Reviewed, Coping Strategies, Substance Abuse, Suicide, Crisis, Other  [Domestic Violence]  Nutrition Interventions   Nutrition Discussed/Reviewed Adding fruits and vegetables, Nutrition Discussed, Nutrition Reviewed, Fluid intake, Increaing proteins, Decreasing fats, Decreasing salt, Carbohydrate meal planning, Portion sizes, Decreasing sugar intake  [Encouraged]  Pharmacy Interventions   Pharmacy Dicussed/Reviewed Pharmacy Topics Discussed, Medication Adherence, Pharmacy Topics Reviewed, Affording Medications  [Encouraged]  Safety Interventions   Safety Discussed/Reviewed Safety Discussed, Safety Reviewed, Fall Risk, Home Safety  [Encouraged]  Home Safety Assistive Devices, Need for home safety assessment, Refer for community resources  [Encouraged]  Advanced Directive Interventions   Advanced Directives Discussed/Reviewed Advanced Directives Discussed, Advanced Directives Reviewed, Provided resource for acquiring and filling out documents  [Encouraged]     Active Listening & Reflection Utilized.  Verbalization of Feelings Encouraged.  Emotional Support Provided. Feelings of Caregiver Burnout Validated. Caregiver Stress Acknowledged. Caregiver Resources Reviewed. Caregiver Support Groups Mailed. Self-Enrollment in Caregiver Support Group of Interest Emphasized. Problem Solving Interventions Activated Task-Centered Solutions Employed.   Solution-Focused Strategies Implemented. Acceptance  & Commitment Therapy Performed. Cognitive Behavioral Therapy Indicated. Client-Centered Therapy Enacted. Confirmed Receipt & Thoroughly Reviewed The Following List of [employer], Services, Wellsite geologist, to [employer] & Entertain Questions: ~ 2023 Medicaid Tips ~ Medicaid Application ~ Making Medicaid Accessible: The [employer] Assistance     for Patients  ~ Neurosurgeon in [place] ~ Personal Care Services Application ~ Development worker, international aid Providers ~  Transportation Options Confirmed Disinterest in Applying for [employer], through The [facility] of [facility] [phone]), Due to Monthly Social Security Income Exceeding Poverty Guidelines for the [place] for [employer] 2024. Discussed Ineligibility to Apply for Personal Care Services, through [facility] [phone]), Without Active Medicaid, through The [facility] of Social Services [phone]). Confirmed Receipt & Thoroughly Reviewed Advance Directives (Living Will & Healthcare Power of Attorney Documents), to Ensure Understanding, Entertain Questions & Encourage Completion with Family. Encouraged Submission of Completed, Signed, Witnessed & Notarized Merchant navy officer (Living Will & Healthcare Power of Attorney Documents) to Primary Care Provider, Family Nurse Practitioner, Gilford Silvius with [facility] [phone]), to Scan into Electronic Medical Record in Epic. Encouraged Careers information officer with CSW (# [phone]), if You Have Questions, Need Assistance, or If Additional Social Work Needs Are Identified Between Now & Our Next Scheduled Follow-Up Outreach Call.      Our next appointment is by telephone on 04/13/2023 at 10:45 am.   Please call the care guide team at [phone] if you need to cancel or reschedule your appointment.   If you are  experiencing a Mental Health or Behavioral Health Crisis or need someone to talk to, please call the Suicide and Crisis Lifeline: 988 call the [place] National Suicide Prevention Lifeline: 502-506-7111 or TTY: (304)335-0244 TTY (248)665-3866) to talk to a trained counselor call 1-800-273-TALK (toll free, 24 hour hotline) go to [facility] [address], [place] [phone]) call the [facility] Crisis Line: 773-037-5731 call 911  Patient verbalizes understanding of instructions and care plan provided today and agrees to view in MyChart. Active MyChart status and patient understanding of how to access instructions and care plan via MyChart confirmed with patient.     Telephone follow up appointment with care management team member scheduled for:  04/13/2023 at 10:45 am.   Danford Bad, BSW, MSW, LCSW  Licensed Clinical Social Worker  [facility]  Mailing [place]. [address], [place], [place] [address] Physical Address[address], [place], [place] [address] Toll Free Main # [phone] Fax # [phone] Cell # [phone] [email or web address]

## 2023-03-29 NOTE — Patient Outreach (Signed)
Care Coordination   Follow Up Visit Note   03/29/2023  Name: Kristine Mata MRN: [id number] DOB: June 29, 1956  Kristine Mata is a 67 y.o. year old female who sees Kristine Mata, Kristine Albino, [name] for primary care. I spoke with Kristine Mata by phone today.  What matters to the patients health and wellness today?   Receive Assistance Applying for Medicaid and Personal Care Services.   Goals Addressed               This Visit's Progress     Receive Assistance Applying for Medicaid and Personal Care Services. (pt-stated)   On track     Care Coordination Interventions:  Interventions Today    Flowsheet Row Most Recent Value  Chronic Disease   Chronic disease during today's visit Hypertension (HTN), Other  [Osteopenia, Morbid Obesity, Unsteady Balance/Gait, Chronic Right Knee Pain & Inability to Perform Activities of Daily Living Independently]  General Interventions   General Interventions Discussed/Reviewed General Interventions Discussed, Labs, Vaccines, Doctor Visits, Communication with, Level of Care, [facility], Health Screening, Annual Foot Exam, General Interventions Reviewed, Horticulturist, commercial (DME), Annual Eye Exam  Labs Hgb A1c annually  [Encouraged]  Vaccines COVID-19, Flu, Pneumonia, RSV, Shingles, Tetanus/Pertussis/Diphtheria  [Encouraged]  Doctor Visits Discussed/Reviewed Doctor Visits Discussed, Doctor Visits Reviewed, Annual Wellness Visits, PCP, Specialist  [Encouraged]  Health Screening Bone Density, Colonoscopy, Mammogram  [Encouraged]  PCP/Specialist Visits Compliance with follow-up visit  [Encouraged]  Communication with PCP/Specialists  Level of Care Adult Daycare, Personal Care Services, Applications, Assisted Living, Skilled Nursing Facility  [Encouraged]  Applications Medicaid, Personal Care Services  [Encouraged]  Exercise Interventions   Exercise Discussed/Reviewed Exercise Discussed, Exercise Reviewed, Assistive device use and maintanence,  Physical Activity  [Encouraged]  Physical Activity Discussed/Reviewed Physical Activity Discussed, Physical Activity Reviewed, Types of exercise, Home Exercise Program (HEP)  [Encouraged]  Education Interventions   Education Provided Provided Therapist, sports, Provided Web-based Education, Provided Education  Provided Verbal Education On Nutrition, Mental Health/Coping with Illness, Applications, Exercise, Medication, Development worker, community, [facility], When to see the doctor, Eye Care  [Encouraged]  Applications Medicaid, Personal Care Services  [Encouraged]  Mental Health Interventions   Mental Health Discussed/Reviewed Mental Health Discussed, Anxiety, Depression, Grief and Loss, Mental Health Reviewed, Coping Strategies, Substance Abuse, Suicide, Crisis, Other  [Domestic Violence]  Nutrition Interventions   Nutrition Discussed/Reviewed Adding fruits and vegetables, Nutrition Discussed, Nutrition Reviewed, Fluid intake, Increaing proteins, Decreasing fats, Decreasing salt, Carbohydrate meal planning, Portion sizes, Decreasing sugar intake  [Encouraged]  Pharmacy Interventions   Pharmacy Dicussed/Reviewed Pharmacy Topics Discussed, Medication Adherence, Pharmacy Topics Reviewed, Affording Medications  [Encouraged]  Safety Interventions   Safety Discussed/Reviewed Safety Discussed, Safety Reviewed, Fall Risk, Home Safety  [Encouraged]  Home Safety Assistive Devices, Need for home safety assessment, Refer for community resources  [Encouraged]  Advanced Directive Interventions   Advanced Directives Discussed/Reviewed Advanced Directives Discussed, Advanced Directives Reviewed, Provided resource for acquiring and filling out documents  [Encouraged]     Active Listening & Reflection Utilized.  Verbalization of Feelings Encouraged.  Emotional Support Provided. Feelings of Caregiver Burnout Validated. Caregiver Stress Acknowledged. Caregiver Resources Reviewed. Caregiver Support Groups  Mailed. Self-Enrollment in Caregiver Support Group of Interest Emphasized. Problem Solving Interventions Activated Task-Centered Solutions Employed.   Solution-Focused Strategies Implemented. Acceptance & Commitment Therapy Performed. Cognitive Behavioral Therapy Indicated. Client-Centered Therapy Enacted. Confirmed Receipt & Thoroughly Reviewed The Following List of [employer], Services, [employer], to [employer] & Entertain Questions: ~ 2023 Medicaid Tips ~ Medicaid Application ~  Making Medicaid Accessible: [place] Offers Walk-In Assistance      for Patients  ~ Neurosurgeon in [place] ~ Personal Care Services Application ~ Personal Care Services Providers ~ Transportation Options Confirmed Disinterest in Applying for [employer], through The [employer] of [facility] [phone]), Due to Monthly Social Security Income Exceeding Poverty Guidelines for the [place] of [place] for [employer] 2024. Discussed Ineligibility to Apply for Personal Care Services, through [facility] [phone]), Without Active Medicaid, through The [facility] of Social Services [phone]). Confirmed Receipt & Thoroughly Reviewed Advance Directives (Living Will & Healthcare Power of Attorney Documents), to Ensure Understanding, Entertain Questions & Encourage Completion with Family. Encouraged Submission of Completed, Signed, Witnessed & Notarized Merchant navy officer (Living Will & Healthcare Power of Attorney Documents) to Primary Care Provider, Family Nurse Practitioner, Kristine Mata with [facility] [phone]), to Scan into Electronic Medical Record in Epic. Encouraged Careers information officer with CSW (# [phone]), if You Have Questions, Need Assistance, or If Additional Social Work Needs Are Identified Between Now & Our Next Scheduled Follow-Up Outreach  Call.      SDOH assessments and interventions completed:  Yes.  Care Coordination Interventions:  Yes, provided.   Follow up plan: Follow up call scheduled for 04/13/2023 at 10:45 am.   Encounter Outcome:  Pt. Visit Completed.   Danford Bad, BSW, MSW, LCSW  Licensed Restaurant manager, fast food [facility]  Mailing [place] [place], [place] [address] Physical Address[address] [place], [place] [address] Toll Free Main # [phone] Fax # [phone] Cell # [phone] [email or web address]

## 2023-03-29 NOTE — Addendum Note (Signed)
Addended by: Altamese Sturgis on: 03/29/2023 11:19 AM   Modules accepted: Orders

## 2023-04-13 ENCOUNTER — Ambulatory Visit: Payer: Self-pay | Admitting: *Deleted

## 2023-04-13 ENCOUNTER — Ambulatory Visit: Payer: Medicare HMO | Admitting: Internal Medicine

## 2023-04-13 ENCOUNTER — Encounter: Payer: Self-pay | Admitting: *Deleted

## 2023-04-13 NOTE — Patient Instructions (Signed)
Visit Information  Thank you for taking time to visit with me today. Please don't hesitate to contact me if I can be of assistance to you.   Following are the goals we discussed today:   Goals Addressed               This Visit's Progress     Receive Assistance Applying for Medicaid and Personal Care Services. (pt-stated)   On track     Care Coordination Interventions:  Interventions Today    Flowsheet Row Most Recent Value  Chronic Disease   Chronic disease during today's visit Hypertension (HTN), Other  [Osteopenia, Morbid Obesity, Unsteady Balance/Gait, Chronic Right Knee Pain & Inability to Perform Activities of Daily Living Independently]  General Interventions   General Interventions Discussed/Reviewed General Interventions Discussed, Labs, Vaccines, Doctor Visits, Communication with, Level of Care, [facility], Health Screening, Annual Foot Exam, General Interventions Reviewed, Horticulturist, commercial (DME), Annual Eye Exam  Labs Hgb A1c annually  [Encouraged]  Vaccines COVID-19, Flu, Pneumonia, RSV, Shingles, Tetanus/Pertussis/Diphtheria  [Encouraged]  Doctor Visits Discussed/Reviewed Doctor Visits Discussed, Doctor Visits Reviewed, Annual Wellness Visits, PCP, Specialist  [Encouraged]  Health Screening Bone Density, Colonoscopy, Mammogram  [Encouraged]  PCP/Specialist Visits Compliance with follow-up visit  [Encouraged]  Communication with PCP/Specialists  Level of Care Adult Daycare, Personal Care Services, Applications, Assisted Living, Skilled Nursing Facility  [Encouraged]  Applications Medicaid, Personal Care Services  [Encouraged]  Exercise Interventions   Exercise Discussed/Reviewed Exercise Discussed, Exercise Reviewed, Assistive device use and maintanence, Physical Activity  [Encouraged]  Physical Activity Discussed/Reviewed Physical Activity Discussed, Physical Activity Reviewed, Types of exercise, Home Exercise Program (HEP)  [Encouraged]  Education  Interventions   Education Provided Provided Therapist, sports, Provided Web-based Education, Provided Education  Provided Verbal Education On Nutrition, Mental Health/Coping with Illness, Applications, Exercise, Medication, Development worker, community, [facility], When to see the doctor, Eye Care  [Encouraged]  Applications Medicaid, Personal Care Services  [Encouraged]  Mental Health Interventions   Mental Health Discussed/Reviewed Mental Health Discussed, Anxiety, Depression, Grief and Loss, Mental Health Reviewed, Coping Strategies, Substance Abuse, Suicide, Crisis, Other  [Domestic Violence]  Nutrition Interventions   Nutrition Discussed/Reviewed Adding fruits and vegetables, Nutrition Discussed, Nutrition Reviewed, Fluid intake, Increaing proteins, Decreasing fats, Decreasing salt, Carbohydrate meal planning, Portion sizes, Decreasing sugar intake  [Encouraged]  Pharmacy Interventions   Pharmacy Dicussed/Reviewed Pharmacy Topics Discussed, Medication Adherence, Pharmacy Topics Reviewed, Affording Medications  [Encouraged]  Safety Interventions   Safety Discussed/Reviewed Safety Discussed, Safety Reviewed, Fall Risk, Home Safety  [Encouraged]  Home Safety Assistive Devices, Need for home safety assessment, Refer for community resources  [Encouraged]  Advanced Directive Interventions   Advanced Directives Discussed/Reviewed Advanced Directives Discussed, Advanced Directives Reviewed, Provided resource for acquiring and filling out documents  [Encouraged]     Active Listening & Reflection Utilized.  Verbalization of Feelings Encouraged.  Emotional Support Provided. Feelings of Frustration Validated. Symptoms of Chronic Knee Pain Acknowledged. Problem Solving Interventions Activated. Task-Centered Solutions Employed.   Solution-Focused Strategies Implemented. Acceptance & Commitment Therapy Performed. Cognitive Behavioral Therapy Indicated. Client-Centered Therapy Enacted. Discussed Process  for Ordering Groceries On-Line & Having Delivered Directly to Home, to Reduce Risk of Food Insecurity, Reduce Risk of Falls & Injury & Promote Independence. Discussed Referral to Home Health Agency of Choice to Receive Home Health Physical Therapy Services, to Prevent Risk of Falls & Injury, Perform Home Safety Evaluation, Promote Greater Independence with Activities of Daily Living, Promote Balance & Gait Stability, Reduce Symptoms of Chronic Knee Pain, Etc., & Obtained  Verbal Consent to Request Order from Primary Care Provider, Family Nurse Practitioner, Gilford Silvius with [facility] [phone]). CSW Collaboration with Primary Care Provider, Family Nurse Practitioner, Gilford Silvius with [facility] [phone]), Via Secure Chat Message in Epic, to Request Order for Home Health Physical Therapy Services & Referral to [facility] Agency of Choice. CSW Collaboration with Primary Care Provider, Family Nurse Practitioner, Gilford Silvius with [facility] [phone]), Via Secure Chat Message in Epic, to Request Completion of Medical Certification for Application & Renewal of Disability Parking Placard, through The [place] [employer] of [employer]. CSW Collaboration with Primary Care Provider, Family Nurse Practitioner, Gilford Silvius with [facility] [phone]), Via Secure Chat Message in Epic, to Report Frequent Falls with Injury, Chronic Knee Pain Rated 10/10, Edema, Swelling & Inability to Ambulate, Per Your Request.  CSW Collaboration with Primary Care Provider, Family Nurse Practitioner, Gilford Silvius with [facility] [phone]), Via Secure Chat Message in [place], to Inquire About Need for Follow-Up Appointment Sooner Than 06/24/2023, to Address Symptoms of Chronic Knee Pain & Obtain Order for Home Health Physical  Therapy Services. Encouraged Careers information officer with The Following List of [employer], Services & Resources in [place], in An Effort to [employer] & Transportation Services: ~ Neurosurgeon in [place] ~ Chiropodist in [place] Encouraged Submission of Completed, Medical illustrator, Witnessed & Notarized Merchant navy officer (Living Will Microbiologist) to Primary Care Provider, Family Nurse Practitioner, Gilford Silvius with [facility] [phone]), to Scan into Electronic Medical Record in Epic, During Next Follow-Up Appointment. Encouraged Careers information officer with CSW (# [phone]), if You Have Questions, Need Assistance, or If Additional Social Work Needs Are Identified Between Now & Our Next Scheduled Follow-Up Outreach Call.      Our next appointment is by telephone on 04/26/2023 at 12:00 pm.  Please call the care guide team at [phone] if you need to cancel or reschedule your appointment.   If you are experiencing a Mental Health or Behavioral Health Crisis or need someone to talk to, please call the Suicide and Crisis Lifeline: 988 call the [place] National Suicide Prevention Lifeline: 862-150-0595 or TTY: 641-720-6848 TTY 330-055-3051) to talk to a trained counselor call 1-800-273-TALK (toll free, 24 hour hotline) go to [facility] [address], [place] [phone]) call the [facility] Crisis Line: ([phone] call 911  Patient verbalizes understanding of instructions and care plan provided today and agrees to view in MyChart. Active MyChart status and patient understanding of how to access instructions and care plan via MyChart confirmed with patient.     Telephone follow up appointment with care management team member scheduled for:  04/26/2023 at 12:00 pm.  Danford Bad, BSW, MSW, LCSW  Licensed  Clinical Social Worker  [facility]  Mailing [place]. [address], [place], [place] [address] Physical Address[address] [place], [place] [address] Toll Free Main # [phone] Fax # [phone] Cell # [phone] [email or web address]

## 2023-04-13 NOTE — Patient Outreach (Signed)
Care Coordination   Follow Up Visit Note   04/13/2023  Name: Kristine Mata MRN: [id number] DOB: 25-Mar-1956  Kristine Mata is a 67 y.o. year old female who sees Rakes, Doralee Albino, [name] for primary care. I spoke with Geoffry Paradise by phone today.  What matters to the patients health and wellness today?  Receive Assistance Applying for Medicaid and Personal Care Services.   Goals Addressed               This Visit's Progress     Receive Assistance Applying for Medicaid and Personal Care Services. (pt-stated)   On track     Care Coordination Interventions:  Interventions Today    Flowsheet Row Most Recent Value  Chronic Disease   Chronic disease during today's visit Hypertension (HTN), Other  [Osteopenia, Morbid Obesity, Unsteady Balance/Gait, Chronic Right Knee Pain & Inability to Perform Activities of Daily Living Independently]  General Interventions   General Interventions Discussed/Reviewed General Interventions Discussed, Labs, Vaccines, Doctor Visits, Communication with, Level of Care, [facility], Health Screening, Annual Foot Exam, General Interventions Reviewed, Horticulturist, commercial (DME), Annual Eye Exam  Labs Hgb A1c annually  [Encouraged]  Vaccines COVID-19, Flu, Pneumonia, RSV, Shingles, Tetanus/Pertussis/Diphtheria  [Encouraged]  Doctor Visits Discussed/Reviewed Doctor Visits Discussed, Doctor Visits Reviewed, Annual Wellness Visits, PCP, Specialist  [Encouraged]  Health Screening Bone Density, Colonoscopy, Mammogram  [Encouraged]  PCP/Specialist Visits Compliance with follow-up visit  [Encouraged]  Communication with PCP/Specialists  Level of Care Adult Daycare, Personal Care Services, Applications, Assisted Living, Skilled Nursing Facility  [Encouraged]  Applications Medicaid, Personal Care Services  [Encouraged]  Exercise Interventions   Exercise Discussed/Reviewed Exercise Discussed, Exercise Reviewed, Assistive device use and maintanence,  Physical Activity  [Encouraged]  Physical Activity Discussed/Reviewed Physical Activity Discussed, Physical Activity Reviewed, Types of exercise, Home Exercise Program (HEP)  [Encouraged]  Education Interventions   Education Provided Provided Therapist, sports, Provided Web-based Education, Provided Education  Provided Verbal Education On Nutrition, Mental Health/Coping with Illness, Applications, Exercise, Medication, Development worker, community, [facility], When to see the doctor, Eye Care  [Encouraged]  Applications Medicaid, Personal Care Services  [Encouraged]  Mental Health Interventions   Mental Health Discussed/Reviewed Mental Health Discussed, Anxiety, Depression, Grief and Loss, Mental Health Reviewed, Coping Strategies, Substance Abuse, Suicide, Crisis, Other  [Domestic Violence]  Nutrition Interventions   Nutrition Discussed/Reviewed Adding fruits and vegetables, Nutrition Discussed, Nutrition Reviewed, Fluid intake, Increaing proteins, Decreasing fats, Decreasing salt, Carbohydrate meal planning, Portion sizes, Decreasing sugar intake  [Encouraged]  Pharmacy Interventions   Pharmacy Dicussed/Reviewed Pharmacy Topics Discussed, Medication Adherence, Pharmacy Topics Reviewed, Affording Medications  [Encouraged]  Safety Interventions   Safety Discussed/Reviewed Safety Discussed, Safety Reviewed, Fall Risk, Home Safety  [Encouraged]  Home Safety Assistive Devices, Need for home safety assessment, Refer for community resources  [Encouraged]  Advanced Directive Interventions   Advanced Directives Discussed/Reviewed Advanced Directives Discussed, Advanced Directives Reviewed, Provided resource for acquiring and filling out documents  [Encouraged]     Active Listening & Reflection Utilized.  Verbalization of Feelings Encouraged.  Emotional Support Provided. Feelings of Frustration Validated. Symptoms of Chronic Knee Pain Acknowledged. Problem Solving Interventions Activated. Task-Centered  Solutions Employed.   Solution-Focused Strategies Implemented. Acceptance & Commitment Therapy Performed. Cognitive Behavioral Therapy Indicated. Client-Centered Therapy Enacted. Discussed Process for Ordering Groceries On-Line & Having Delivered Directly to Home, to Reduce Risk of Food Insecurity, Reduce Risk of Falls & Injury & Promote Independence. Discussed Referral to Home Health Agency of Choice to Receive Home Health Physical Therapy Services,  to Prevent Risk of Falls & Injury, Perform Home Safety Evaluation, Promote Greater Independence with Activities of Daily Living, Promote Balance & Gait Stability, Reduce Symptoms of Chronic Knee Pain, Etc., & Obtained Verbal Consent to Request Order from Primary Care Provider, Family Nurse Practitioner, Gilford Silvius with [facility] [phone]). CSW Collaboration with Primary Care Provider, Family Nurse Practitioner, Gilford Silvius with [facility] [phone]), Via Secure Chat Message in Epic, to Request Order for Home Health Physical Therapy Services & Referral to [facility] Agency of Choice. CSW Collaboration with Primary Care Provider, Family Nurse Practitioner, Gilford Silvius with [facility] [phone]), Via Secure Chat Message in Epic, to Request Completion of Medical Certification for Application & Renewal of Disability Parking Placard, through [employer]. CSW Collaboration with Primary Care Provider, Family Nurse Practitioner, Gilford Silvius with [facility] [phone]), Via Secure Chat Message in Epic, to Report Frequent Falls with Injury, Chronic Knee Pain Rated 10/10, Edema, Swelling & Inability to Ambulate, Per Your Request.  CSW Collaboration with Primary Care Provider, Family Nurse Practitioner, Gilford Silvius with [facility] [phone]), Via Secure Chat Message in [place], to Inquire About Need for Follow-Up Appointment Sooner Than 06/24/2023, to Address Symptoms of Chronic Knee Pain & Obtain Order for Home Health Physical Therapy Services. Encouraged Careers information officer with [employer], Services & Resources in [place], in An Effort to [employer] & Transportation Services: ~ Neurosurgeon in [place] ~ Chiropodist in [place] Encouraged Submission of Completed, Medical illustrator, Witnessed & Notarized Merchant navy officer (Living Will Microbiologist) to Primary Care Provider, Family Nurse Practitioner, Gilford Silvius with [facility] [phone]), to Scan into Electronic Medical Record in Epic, During Next Follow-Up Appointment. Encouraged Careers information officer with CSW (# [phone]), if You Have Questions, Need Assistance, or If Additional Social Work Needs Are Identified Between Now & Our Next Scheduled Follow-Up Outreach Call.      SDOH assessments and interventions completed:  Yes.  Care Coordination Interventions:  Yes, provided.   Follow up plan: Follow up call scheduled for 04/26/2023 at 12:00 pm.  Encounter Outcome:  Pt. Visit Completed.   Danford Bad, BSW, MSW, LCSW  Licensed Restaurant manager, fast food [facility] System  Mailing [place] [address], [place], [place] [address] Physical Address-[address] [place], [place] [address] Toll Free Main # [phone] Fax # [phone] Cell # [phone] [email or web address]

## 2023-04-26 ENCOUNTER — Ambulatory Visit: Payer: Self-pay | Admitting: *Deleted

## 2023-04-26 ENCOUNTER — Encounter: Payer: Self-pay | Admitting: *Deleted

## 2023-04-27 ENCOUNTER — Telehealth: Payer: Self-pay

## 2023-04-27 NOTE — Patient Outreach (Signed)
Care Coordination   Follow Up Visit Note   04/27/2023 - Late Entry  Name: Kristine Mata MRN: [id number] DOB: Oct 25, 1956  Kristine Mata is a 67 y.o. year old female who sees Rakes, Doralee Albino, [name] for primary care. I spoke with Geoffry Paradise by phone today.  What matters to the patients health and wellness today?  Receive Assistance Applying for Medicaid and Personal Care Services.   Goals Addressed               This Visit's Progress     Receive Assistance Applying for Medicaid and Personal Care Services. (pt-stated)   On track     Care Coordination Interventions:  Interventions Today    Flowsheet Row Most Recent Value  Chronic Disease   Chronic disease during today's visit Hypertension (HTN), Other  [Osteopenia, Morbid Obesity, Unsteady Balance/Gait, Chronic Right Knee Pain & Inability to Perform Activities of Daily Living Independently]  General Interventions   General Interventions Discussed/Reviewed General Interventions Discussed, Labs, Vaccines, Doctor Visits, Communication with, Level of Care, [facility], Health Screening, Annual Foot Exam, General Interventions Reviewed, Horticulturist, commercial (DME), Annual Eye Exam  Labs Hgb A1c annually  [Encouraged]  Vaccines COVID-19, Flu, Pneumonia, RSV, Shingles, Tetanus/Pertussis/Diphtheria  [Encouraged]  Doctor Visits Discussed/Reviewed Doctor Visits Discussed, Doctor Visits Reviewed, Annual Wellness Visits, PCP, Specialist  [Encouraged]  Health Screening Bone Density, Colonoscopy, Mammogram  [Encouraged]  PCP/Specialist Visits Compliance with follow-up visit  [Encouraged]  Communication with PCP/Specialists  Level of Care Adult Daycare, Personal Care Services, Applications, Assisted Living, Skilled Nursing Facility  [Encouraged]  Applications Medicaid, Personal Care Services  [Encouraged]  Exercise Interventions   Exercise Discussed/Reviewed Exercise Discussed, Exercise Reviewed, Assistive device use and  maintanence, Physical Activity  [Encouraged]  Physical Activity Discussed/Reviewed Physical Activity Discussed, Physical Activity Reviewed, Types of exercise, Home Exercise Program (HEP)  [Encouraged]  Education Interventions   Education Provided Provided Therapist, sports, Provided Web-based Education, Provided Education  Provided Verbal Education On Nutrition, Mental Health/Coping with Illness, Applications, Exercise, Medication, Development worker, community, [facility], When to see the doctor, Eye Care  [Encouraged]  Applications Medicaid, Personal Care Services  [Encouraged]  Mental Health Interventions   Mental Health Discussed/Reviewed Mental Health Discussed, Anxiety, Depression, Grief and Loss, Mental Health Reviewed, Coping Strategies, Substance Abuse, Suicide, Crisis, Other  [Domestic Violence]  Nutrition Interventions   Nutrition Discussed/Reviewed Adding fruits and vegetables, Nutrition Discussed, Nutrition Reviewed, Fluid intake, Increaing proteins, Decreasing fats, Decreasing salt, Carbohydrate meal planning, Portion sizes, Decreasing sugar intake  [Encouraged]  Pharmacy Interventions   Pharmacy Dicussed/Reviewed Pharmacy Topics Discussed, Medication Adherence, Pharmacy Topics Reviewed, Affording Medications  [Encouraged]  Safety Interventions   Safety Discussed/Reviewed Safety Discussed, Safety Reviewed, Fall Risk, Home Safety  [Encouraged]  Home Safety Assistive Devices, Need for home safety assessment, Refer for community resources  [Encouraged]  Advanced Directive Interventions   Advanced Directives Discussed/Reviewed Advanced Directives Discussed, Advanced Directives Reviewed, Provided resource for acquiring and filling out documents  [Encouraged]     Active Listening & Reflection Utilized.  Verbalization of Feelings Encouraged.  Emotional Support Provided. Feelings of Frustration Validated. Symptoms of Chronic Knee Pain Acknowledged. Problem Solving Interventions  Activated. Task-Centered Solutions Employed.   Solution-Focused Strategies Implemented. Acceptance & Commitment Therapy Performed. Cognitive Behavioral Therapy Indicated. Client-Centered Therapy Enacted. CSW Collaboration with Primary Care Provider, Family Nurse Practitioner, Gilford Silvius with [facility] [phone]), Via Secure Chat Message in Epic, to Request Order for Home Health Physical Therapy Services & Referral to Home  Health Agency of Choice. CSW Collaboration with Primary Care Provider, Family Nurse Practitioner, Gilford Silvius with [facility] [phone]), Via Secure Chat Message in Epic, to Request Completion of Medical Certification for Application & Renewal of Disability Parking Placard, through [employer]. CSW Collaboration with Primary Care Provider, Family Nurse Practitioner, Gilford Silvius with [facility] [phone]), Via Secure Chat Message in Epic, to Report Frequent Falls with Injury, Chronic Knee Pain Rated 10/10, Edema, Swelling & Inability to Ambulate, Per Your Request.  Encouraged Attendance at Follow-Up Appointment with [employer] Nurse Practitioner, Rica Records with [facility] [phone]), Scheduled on 05/13/2023 at 2:00 PM. Encouraged Continued Contact with Lobbyist, Services & Resources of Interest in [place], in An Effort to [employer]. Encouraged Continued Warehouse manager, Wellsite geologist of Interest in [place], in An Effort to [employer]. Encouraged Submission of Completed, Signed, Witnessed & Notarized Merchant navy officer (Living Will & Healthcare Power of Attorney Documents) to Primary Care Provider, Family Nurse Practitioner, Gilford Silvius with [facility] [phone]), to Scan into Electronic Medical Record in Epic, During Next Follow-Up Appointment. Encouraged Careers information officer with CSW (# [phone]), if You Have Questions, Need Assistance, or If Additional Social Work Needs Are Identified Between Now & Our Next Scheduled Follow-Up Outreach Call.      SDOH assessments and interventions completed:  Yes.  Care Coordination Interventions:  Yes, provided.   Follow up plan: Follow up call scheduled for 05/10/2023 at 12:45 pm.  Encounter Outcome:  Pt. Visit Completed.   Danford Bad, BSW, MSW, LCSW  Licensed Restaurant manager, fast food [facility] System  Mailing [place] [address], [place], [place] [address] Physical Address-[address], [place], [place] [address] Toll Free Main # [phone] Fax # [phone] Cell # [phone] [email or web address]

## 2023-04-27 NOTE — Patient Instructions (Signed)
Visit Information  Thank you for taking time to visit with me today. Please don't hesitate to contact me if I can be of assistance to you.   Following are the goals we discussed today:   Goals Addressed               This Visit's Progress     Receive Assistance Applying for Medicaid and Personal Care Services. (pt-stated)   On track     Care Coordination Interventions:  Interventions Today    Flowsheet Row Most Recent Value  Chronic Disease   Chronic disease during today's visit Hypertension (HTN), Other  [Osteopenia, Morbid Obesity, Unsteady Balance/Gait, Chronic Right Knee Pain & Inability to Perform Activities of Daily Living Independently]  General Interventions   General Interventions Discussed/Reviewed General Interventions Discussed, Labs, Vaccines, Doctor Visits, Communication with, Level of Care, [facility], Health Screening, Annual Foot Exam, General Interventions Reviewed, Horticulturist, commercial (DME), Annual Eye Exam  Labs Hgb A1c annually  [Encouraged]  Vaccines COVID-19, Flu, Pneumonia, RSV, Shingles, Tetanus/Pertussis/Diphtheria  [Encouraged]  Doctor Visits Discussed/Reviewed Doctor Visits Discussed, Doctor Visits Reviewed, Annual Wellness Visits, PCP, Specialist  [Encouraged]  Health Screening Bone Density, Colonoscopy, Mammogram  [Encouraged]  PCP/Specialist Visits Compliance with follow-up visit  [Encouraged]  Communication with PCP/Specialists  Level of Care Adult Daycare, Personal Care Services, Applications, Assisted Living, Skilled Nursing Facility  [Encouraged]  Applications Medicaid, Personal Care Services  [Encouraged]  Exercise Interventions   Exercise Discussed/Reviewed Exercise Discussed, Exercise Reviewed, Assistive device use and maintanence, Physical Activity  [Encouraged]  Physical Activity Discussed/Reviewed Physical Activity Discussed, Physical Activity Reviewed, Types of exercise, Home Exercise Program (HEP)  [Encouraged]  Education  Interventions   Education Provided Provided Therapist, sports, Provided Web-based Education, Provided Education  Provided Verbal Education On Nutrition, Mental Health/Coping with Illness, Applications, Exercise, Medication, Development worker, community, [facility], When to see the doctor, Eye Care  [Encouraged]  Applications Medicaid, Personal Care Services  [Encouraged]  Mental Health Interventions   Mental Health Discussed/Reviewed Mental Health Discussed, Anxiety, Depression, Grief and Loss, Mental Health Reviewed, Coping Strategies, Substance Abuse, Suicide, Crisis, Other  [Domestic Violence]  Nutrition Interventions   Nutrition Discussed/Reviewed Adding fruits and vegetables, Nutrition Discussed, Nutrition Reviewed, Fluid intake, Increaing proteins, Decreasing fats, Decreasing salt, Carbohydrate meal planning, Portion sizes, Decreasing sugar intake  [Encouraged]  Pharmacy Interventions   Pharmacy Dicussed/Reviewed Pharmacy Topics Discussed, Medication Adherence, Pharmacy Topics Reviewed, Affording Medications  [Encouraged]  Safety Interventions   Safety Discussed/Reviewed Safety Discussed, Safety Reviewed, Fall Risk, Home Safety  [Encouraged]  Home Safety Assistive Devices, Need for home safety assessment, Refer for community resources  [Encouraged]  Advanced Directive Interventions   Advanced Directives Discussed/Reviewed Advanced Directives Discussed, Advanced Directives Reviewed, Provided resource for acquiring and filling out documents  [Encouraged]     Active Listening & Reflection Utilized.  Verbalization of Feelings Encouraged.  Emotional Support Provided. Feelings of Frustration Validated. Symptoms of Chronic Knee Pain Acknowledged. Problem Solving Interventions Activated. Task-Centered Solutions Employed.   Solution-Focused Strategies Implemented. Acceptance & Commitment Therapy Performed. Cognitive Behavioral Therapy Indicated. Client-Centered Therapy Enacted. CSW Collaboration  with Primary Care Provider, Family Nurse Practitioner, Gilford Silvius with [facility] [phone]), Via Secure Chat Message in Epic, to Request Order for Home Health Physical Therapy Services & Referral to [facility] Agency of Choice. CSW Collaboration with Primary Care Provider, Family Nurse Practitioner, Gilford Silvius with [facility] [phone]), Via Secure Chat Message in Epic, to Request Completion of Medical Certification  for Application & Renewal of Disability Parking Placard, through [employer]. CSW Collaboration with Primary Care Provider, Family Nurse Practitioner, Gilford Silvius with [facility] [phone]), Via Secure Chat Message in Epic, to Report Frequent Falls with Injury, Chronic Knee Pain Rated 10/10, Edema, Swelling & Inability to Ambulate, Per Your Request.  Encouraged Attendance at Follow-Up Appointment with [facility] Nurse Practitioner, Rica Records with [facility] [phone]), Scheduled on 05/13/2023 at 2:00 PM. Encouraged Continued Contact with Lobbyist, Services & Resources of Interest in [place], in An Effort to [employer]. Encouraged Continued Warehouse manager, Wellsite geologist of Interest in [place], in An Effort to [employer]. Encouraged Submission of Completed, Signed, Witnessed & Notarized Merchant navy officer (Living Will & Healthcare Power of Attorney Documents) to Primary Care Provider, Family Nurse Practitioner, Gilford Silvius with [facility] [phone]), to Scan into Electronic Medical Record in Epic, During Next Follow-Up Appointment. Encouraged Careers information officer with CSW (# [phone]), if You Have Questions, Need Assistance, or If Additional Social Work Needs Are  Identified Between Now & Our Next Scheduled Follow-Up Outreach Call.      Our next appointment is by telephone on 05/10/2023 at 12:45 pm.  Please call the care guide team at [phone] if you need to cancel or reschedule your appointment.   If you are experiencing a Mental Health or Behavioral Health Crisis or need someone to talk to, please call the Suicide and Crisis Lifeline: 988 call the [place] National Suicide Prevention Lifeline: (718) 624-8593 or TTY: (507)423-1692 TTY 601-599-3958) to talk to a trained counselor call 1-800-273-TALK (toll free, 24 hour hotline) go to [facility] [address] [place] [phone]) call the [facility] Crisis Line: 417-491-7594 call 911  Patient verbalizes understanding of instructions and care plan provided today and agrees to view in MyChart. Active MyChart status and patient understanding of how to access instructions and care plan via MyChart confirmed with patient.     Telephone follow up appointment with care management team member scheduled for: 05/10/2023 at 12:45 pm.  Danford Bad, BSW, MSW, LCSW  Licensed Clinical Social Worker  [employer] treasurer [facility]  Mailing [place]. [address], [place], [place] [address] Physical Address[address] [place], [place] [address] Toll Free Main # [phone] Fax # [phone] Cell # [phone] [email or web address]

## 2023-04-27 NOTE — Telephone Encounter (Signed)
Kristine Mata is calling regarding her lab results from 03/23/23 I dont see any recommendations on these results yet, can you please advise? Thanks

## 2023-05-09 ENCOUNTER — Encounter: Payer: Self-pay | Admitting: *Deleted

## 2023-05-10 ENCOUNTER — Ambulatory Visit: Payer: Self-pay | Admitting: *Deleted

## 2023-05-10 NOTE — Patient Outreach (Signed)
  Care Coordination   05/10/2023  Name: Kristine Mata MRN: [id number] DOB: 06-13-1956   Care Coordination Outreach Attempts:  An unsuccessful telephone outreach was attempted today to offer the patient information about available care coordination services.  HIPAA compliant messages left on voicemail, providing contact information for CSW, encouraging patient to return CSW's call at her earliest convenience.  Follow Up Plan:  Additional outreach attempts will be made to offer the patient care coordination information and services.   Encounter Outcome:  No Answer.   Care Coordination Interventions:  No, not indicated.    Danford Bad, BSW, MSW, LCSW  Licensed Restaurant manager, fast food [facility] System  Mailing [place] [address] [place], [place] [address] Physical Address-[address] [place], [place] [address] Toll Free Main # [phone] Fax # [phone] Cell # [phone] [email or web address]

## 2023-05-11 ENCOUNTER — Telehealth: Payer: Self-pay | Admitting: *Deleted

## 2023-05-11 NOTE — Progress Notes (Signed)
  Care Coordination Note  05/11/2023 Name: Kristine Mata MRN: [id number] DOB: [birth year]  Kristine Mata is a 67 y.o. year old female who is a primary care patient of Rakes, Doralee Albino, [name] and is actively engaged with the care management team. I reached out to Geoffry Paradise by phone today to assist with re-scheduling a follow up visit with the Licensed Clinical Social Worker  Follow up plan: Unsuccessful telephone outreach attempt made.  [facility]  Care Coordination Care Guide  Direct Dial: [phone]

## 2023-05-13 ENCOUNTER — Encounter: Payer: Self-pay | Admitting: Family Medicine

## 2023-05-13 ENCOUNTER — Ambulatory Visit (INDEPENDENT_AMBULATORY_CARE_PROVIDER_SITE_OTHER): Payer: Medicare HMO | Admitting: Family Medicine

## 2023-05-13 VITALS — BP 142/80 | HR 61 | Ht 70.0 in | Wt 310.0 lb

## 2023-05-13 DIAGNOSIS — I10 Essential (primary) hypertension: Secondary | ICD-10-CM | POA: Diagnosis not present

## 2023-05-13 DIAGNOSIS — G8929 Other chronic pain: Secondary | ICD-10-CM

## 2023-05-13 DIAGNOSIS — E7849 Other hyperlipidemia: Secondary | ICD-10-CM | POA: Diagnosis not present

## 2023-05-13 DIAGNOSIS — M25562 Pain in left knee: Secondary | ICD-10-CM | POA: Diagnosis not present

## 2023-05-13 DIAGNOSIS — J0110 Acute frontal sinusitis, unspecified: Secondary | ICD-10-CM | POA: Diagnosis not present

## 2023-05-13 DIAGNOSIS — J019 Acute sinusitis, unspecified: Secondary | ICD-10-CM | POA: Insufficient documentation

## 2023-05-13 DIAGNOSIS — M25561 Pain in right knee: Secondary | ICD-10-CM | POA: Diagnosis not present

## 2023-05-13 DIAGNOSIS — E0789 Other specified disorders of thyroid: Secondary | ICD-10-CM

## 2023-05-13 DIAGNOSIS — E559 Vitamin D deficiency, unspecified: Secondary | ICD-10-CM | POA: Diagnosis not present

## 2023-05-13 DIAGNOSIS — Z131 Encounter for screening for diabetes mellitus: Secondary | ICD-10-CM

## 2023-05-13 DIAGNOSIS — R7301 Impaired fasting glucose: Secondary | ICD-10-CM | POA: Diagnosis not present

## 2023-05-13 MED ORDER — AMLODIPINE BESYLATE 5 MG PO TABS: 5.0000 mg | ORAL_TABLET | Freq: Every day | ORAL | 1 refills | Status: DC

## 2023-05-13 MED ORDER — ALBUTEROL SULFATE HFA 108 (90 BASE) MCG/ACT IN AERS: 2.0000 | INHALATION_SPRAY | Freq: Four times a day (QID) | RESPIRATORY_TRACT | 2 refills | Status: DC | PRN

## 2023-05-13 MED ORDER — AMOXICILLIN-POT CLAVULANATE 875-125 MG PO TABS
1.0000 | ORAL_TABLET | Freq: Two times a day (BID) | ORAL | 0 refills | Status: AC
Start: 2023-05-13 — End: 2023-05-20

## 2023-05-13 NOTE — Assessment & Plan Note (Signed)
Vitals:   05/13/23 1349 05/13/23 1355  BP: (!) 158/87 (!) 142/80   Blood pressure not controlled in today's visit Currently taking metoprolol 200 mg once daily Started Amlodipine 5 mg daily Follow up in 4 weeks with at home blood pressure readings Continued discussion on DASH diet, low sodium diet and maintain a exercise routine for 150 minutes per week.

## 2023-05-13 NOTE — Progress Notes (Signed)
New Patient Office Visit   Subjective   Patient ID: Kristine Mata, female    DOB: 19-Feb-1956  Age: 67 y.o. MRN: [id number]  CC:  Chief Complaint  Patient presents with   Establish Care    New patient, would like a handicap sticker.    Knee Pain    Pt reports right knee pain from a fall in 2016, busted her knee cap bone rubbing bone. Needs to lose weight in order to have surgery.    Hypertension    Has questions about her blood pressure medications.     HPI Kristine Mata 67 year old female, presents to establish care. She  has a past medical history of Ectopic pregnancy, Heart murmur, Hyperlipidemia, Hypertension, Insomnia, unspecified, Osteopenia (03/12/2021), Right knee pain, and SVT (supraventricular tachycardia).  Patient complains of fever. Patient describes symptoms of wheezing, bilateral facial pain, fatigue, scratchy throat headache, malaise, myalgias, and sore throat. Symptoms began 3 days ago and are gradually worsening since that time. Patient denies nausea and vomiting. Treatment thus far includes OTC analgesics/antipyretics: not very effective Past pulmonary history is significant for no history of pneumonia or bronchitis    Hypertension This is a chronic problem. The problem is uncontrolled. Associated symptoms include headaches, malaise/fatigue and peripheral edema. Pertinent negatives include no blurred vision or shortness of breath. Risk factors for coronary artery disease include sedentary lifestyle, obesity, dyslipidemia and family history. Past treatments include diuretics and beta blockers. The current treatment provides mild improvement. Compliance problems include exercise.       Outpatient Encounter Medications as of 05/13/2023  Medication Sig   amLODipine (NORVASC) 5 MG tablet Take 1 tablet (5 mg total) by mouth daily.   amoxicillin-clavulanate (AUGMENTIN) 875-125 MG tablet Take 1 tablet by mouth 2 (two) times daily for 7 days.   fluticasone (FLONASE)  50 MCG/ACT nasal spray Place 2 sprays into both nostrils daily.   furosemide (LASIX) 80 MG tablet Take 1 tablet (80 mg total) by mouth daily. (Patient taking differently: Take 40 mg by mouth daily.)   loratadine (CLARITIN) 10 MG tablet Take 1 tablet (10 mg total) by mouth daily.   lovastatin (MEVACOR) 40 MG tablet Take 1 tablet (40 mg total) by mouth at bedtime.   metoprolol (TOPROL-XL) 200 MG 24 hr tablet Take 1 tablet by mouth once daily   Multiple Vitamins-Minerals (MULTIVITAMIN WITH MINERALS) tablet Take 1 tablet by mouth every other day.   traZODone (DESYREL) 50 MG tablet Take 0.5-1 tablets (25-50 mg total) by mouth at bedtime as needed for sleep.   [DISCONTINUED] cetirizine (ZYRTEC) 10 MG tablet Take 10 mg by mouth daily.   No facility-administered encounter medications on file as of 05/13/2023.    Past Surgical History:  Procedure Laterality Date   ABDOMINAL HYSTERECTOMY     BREAST CYST EXCISION Left    67 years old   COLONOSCOPY  2016   [facility]; 1 tubular adenoma, 1 hyperplastic polyp, TI biopsy with reactive lymphoid hyperplasia without active or chronic enteritis.   COLONOSCOPY N/A 12/02/2021   Procedure: COLONOSCOPY;  Surgeon: Corbin Ade, MD;  [facility]: [facility];  Service: Endoscopy;  Laterality: N/A;  7:30am   cyst left breast      SALPINGECTOMY Left    TMJ ARTHROPLASTY      Review of Systems  Constitutional:  Positive for fever and malaise/fatigue.  Eyes:  Negative for blurred vision.  Respiratory:  Negative for shortness of breath.   Gastrointestinal:  Negative for abdominal pain.  Genitourinary:  Negative for dysuria.  Musculoskeletal:  Positive for joint pain and myalgias.  Neurological:  Positive for headaches.      Objective    BP (!) 142/80 (BP Location: Left Arm)   Pulse 61   Ht 5\' 10"  (1.778 m)   Wt (!) 310 lb 0.6 oz (140.6 kg)   SpO2 93%   BMI 44.49 kg/m   Physical Exam Vitals reviewed.  Constitutional:      General: She is not in acute  distress.    Appearance: Normal appearance. She is not ill-appearing, toxic-appearing or diaphoretic.  HENT:     Head: Normocephalic.  Eyes:     General:        Right eye: No discharge.        Left eye: No discharge.     Conjunctiva/sclera: Conjunctivae normal.  Cardiovascular:     Rate and Rhythm: Normal rate.     Pulses: Normal pulses.     Heart sounds: Normal heart sounds.  Pulmonary:     Effort: Pulmonary effort is normal. No respiratory distress.     Breath sounds: Normal breath sounds.  Musculoskeletal:     Cervical back: Normal range of motion.     Right lower leg: Edema present.     Left lower leg: Edema present.  Skin:    General: Skin is warm and dry.     Capillary Refill: Capillary refill takes less than 2 seconds.  Neurological:     General: No focal deficit present.     Mental Status: She is alert and oriented to person, place, and time.     Coordination: Coordination normal.     Gait: Gait normal.  Psychiatric:        Mood and Affect: Mood normal.        Behavior: Behavior normal.       Assessment & Plan:  Essential hypertension -     CBC with Differential/Platelet  Impaired fasting blood sugar  Other hyperlipidemia -     Lipid panel  Other specified disorders of thyroid  Vitamin D deficiency  Screening for diabetes mellitus -     Hemoglobin A1c  Chronic pain of both knees -     Ambulatory referral to Physical Therapy  Acute non-recurrent frontal sinusitis Assessment & Plan: Augmentin 875-125 mg x 7 days Discussed Symptomatic treatment, rest, increase oral fluid intake. Take OTC tylenol for fever. Follow-up for worsening or persistent symptoms. Patient verbalizes understanding regarding plan of care and all questions answered     Orders: -     Amoxicillin-Pot Clavulanate; Take 1 tablet by mouth 2 (two) times daily for 7 days.  Dispense: 14 tablet; Refill: 0  Chronic pain of right knee Assessment & Plan: Chronic now acute- advise patient  to follow up with Orthopedics  Referral placed to physical therapy for pain management  Explained to patient Non pharmacological interventions include the use of ice or heat, rest, recommend range of motion exercises, gentle stretching. The use of tylenol arthritis  for pain management.  Follow up for worsening or persistent symptoms. Patient verbalizes understanding regarding plan of care and all questions answered.    Primary hypertension Assessment & Plan: Vitals:   05/13/23 1349 05/13/23 1355  BP: (!) 158/87 (!) 142/80   Blood pressure not controlled in today's visit Currently taking metoprolol 200 mg once daily Started Amlodipine 5 mg daily Follow up in 4 weeks with at home blood pressure readings Continued discussion on DASH diet, low sodium diet  and maintain a exercise routine for 150 minutes per week.    Other orders -     amLODIPine Besylate; Take 1 tablet (5 mg total) by mouth daily.  Dispense: 30 tablet; Refill: 1    Return in about 4 weeks (around 06/10/2023) for hypertension.   Cruzita Lederer Newman Nip, [name]

## 2023-05-13 NOTE — Assessment & Plan Note (Signed)
Chronic now acute- advise patient to follow up with Orthopedics  Referral placed to physical therapy for pain management  Explained to patient Non pharmacological interventions include the use of ice or heat, rest, recommend range of motion exercises, gentle stretching. The use of tylenol arthritis  for pain management.  Follow up for worsening or persistent symptoms. Patient verbalizes understanding regarding plan of care and all questions answered.

## 2023-05-13 NOTE — Assessment & Plan Note (Addendum)
Augmentin 875-125 mg x 7 days Discussed Symptomatic treatment, rest, increase oral fluid intake. Take OTC tylenol for fever. Follow-up for worsening or persistent symptoms. Patient verbalizes understanding regarding plan of care and all questions answered

## 2023-05-13 NOTE — Patient Instructions (Signed)

## 2023-05-17 NOTE — Progress Notes (Signed)
  Care Coordination Note  05/17/2023 Name: [name] MRN: [id number] DOB: 10/07/1956  Kristine Mata is a 67 y.o. year old female who is a primary care patient of Del Nigel Berthold, [name] and is actively engaged with the care management team. I reached out to Geoffry Paradise by phone today to assist with re-scheduling a follow up visit with the Licensed Clinical Social Worker  Follow up plan: Unsuccessful telephone outreach attempt made. A HIPAA compliant phone message was left for the patient providing contact information and requesting a return call.  We have been unable to make contact with the patient for follow up. The care management team is available to follow up with the patient after provider conversation with the patient regarding recommendation for care management engagement and subsequent re-referral to the care management team.   [facility] Coordination Care Guide  Direct Dial: [phone]

## 2023-05-17 NOTE — Telephone Encounter (Signed)
Yes. Licensed Clinical Social Worker Danford Bad was removed from Care Team.  [facility]  Care Coordination Care Guide  Direct Dial: [phone]

## 2023-05-20 DIAGNOSIS — E7849 Other hyperlipidemia: Secondary | ICD-10-CM | POA: Diagnosis not present

## 2023-05-20 DIAGNOSIS — I10 Essential (primary) hypertension: Secondary | ICD-10-CM | POA: Diagnosis not present

## 2023-05-20 DIAGNOSIS — Z131 Encounter for screening for diabetes mellitus: Secondary | ICD-10-CM | POA: Diagnosis not present

## 2023-05-21 LAB — CBC WITH DIFFERENTIAL/PLATELET
Basophils Absolute: 0 10*3/uL (ref 0.0–0.2)
Basos: 1 %
EOS (ABSOLUTE): 0.1 10*3/uL (ref 0.0–0.4)
Eos: 2 %
Hematocrit: 39 % (ref 34.0–46.6)
Hemoglobin: 12.2 g/dL (ref 11.1–15.9)
Immature Grans (Abs): 0 10*3/uL (ref 0.0–0.1)
Immature Granulocytes: 0 %
Lymphocytes Absolute: 2.7 10*3/uL (ref 0.7–3.1)
Lymphs: 44 %
MCH: 28.2 pg (ref 26.6–33.0)
MCHC: 31.3 g/dL — ABNORMAL LOW (ref 31.5–35.7)
MCV: 90 fL (ref 79–97)
Monocytes Absolute: 0.5 10*3/uL (ref 0.1–0.9)
Monocytes: 7 %
Neutrophils Absolute: 2.8 10*3/uL (ref 1.4–7.0)
Neutrophils: 46 %
Platelets: 296 10*3/uL (ref 150–450)
RBC: 4.32 x10E6/uL (ref 3.77–5.28)
RDW: 13.8 % (ref 11.7–15.4)
WBC: 6.1 10*3/uL (ref 3.4–10.8)

## 2023-05-21 LAB — HEMOGLOBIN A1C
Est. average glucose Bld gHb Est-mCnc: 131 mg/dL
Hgb A1c MFr Bld: 6.2 % — ABNORMAL HIGH (ref 4.8–5.6)

## 2023-05-21 LAB — LIPID PANEL
Chol/HDL Ratio: 2.6 ratio (ref 0.0–4.4)
Cholesterol, Total: 179 mg/dL (ref 100–199)
HDL: 70 mg/dL (ref >39)
LDL Chol Calc (NIH): 95 mg/dL (ref 0–99)
Triglycerides: 78 mg/dL (ref 0–149)
VLDL Cholesterol Cal: 14 mg/dL (ref 5–40)

## 2023-05-22 NOTE — Progress Notes (Signed)
Please inform patient,   Hemoglobin A1c  6.2 indicates prediabetes - no medication intervention just lifestyle changes  It is important to follow a DASH diet which includes vegetables,fruits,whole grains, fat free or low fat diary,fish,poultry,beans,nuts and seeds,vegetable oils. Find an activity that you will enjoyandstart to be active at least 5 days a week for 30 minutes each day.   Repeat labs in 4 months

## 2023-05-23 ENCOUNTER — Telehealth: Payer: Self-pay | Admitting: Family Medicine

## 2023-05-23 NOTE — Telephone Encounter (Signed)
Patient is scheduled for Thursday, 7/11.

## 2023-05-23 NOTE — Telephone Encounter (Signed)
Please tell patient stop medication and to make appointment to see me sooner

## 2023-05-23 NOTE — Telephone Encounter (Signed)
Patient states that  Is making legs and ankles swell and painful. Wants a call back in regard.

## 2023-05-26 ENCOUNTER — Ambulatory Visit (INDEPENDENT_AMBULATORY_CARE_PROVIDER_SITE_OTHER): Payer: Medicare HMO | Admitting: Family Medicine

## 2023-05-26 ENCOUNTER — Encounter: Payer: Self-pay | Admitting: Family Medicine

## 2023-05-26 VITALS — BP 130/79 | HR 74 | Ht 70.0 in | Wt 312.0 lb

## 2023-05-26 DIAGNOSIS — I10 Essential (primary) hypertension: Secondary | ICD-10-CM

## 2023-05-26 NOTE — Progress Notes (Signed)
Patient Office Visit   Subjective   Patient ID: Kristine Mata, female    DOB: 07/13/1956  Age: 67 y.o. MRN: [id number]  CC:  Chief Complaint  Patient presents with   Leg Swelling    Patient complains of feet and leg swelling after starting amlodipine.     HPI Kristine Mata 67 year old female, presents to the clinic for HTN follow up. She  has a past medical history of Ectopic pregnancy, Heart murmur, Hyperlipidemia, Hypertension, Insomnia, unspecified, Osteopenia (03/12/2021), Right knee pain, and SVT (supraventricular tachycardia).For the details of today's visit, please refer to assessment and plan.   HPI    Outpatient Encounter Medications as of 05/26/2023  Medication Sig   albuterol (VENTOLIN HFA) 108 (90 Base) MCG/ACT inhaler Inhale 2 puffs into the lungs every 6 (six) hours as needed for wheezing or shortness of breath.   fluticasone (FLONASE) 50 MCG/ACT nasal spray Place 2 sprays into both nostrils daily.   furosemide (LASIX) 80 MG tablet Take 1 tablet (80 mg total) by mouth daily. (Patient taking differently: Take 40 mg by mouth daily.)   loratadine (CLARITIN) 10 MG tablet Take 1 tablet (10 mg total) by mouth daily.   lovastatin (MEVACOR) 40 MG tablet Take 1 tablet (40 mg total) by mouth at bedtime.   metoprolol (TOPROL-XL) 200 MG 24 hr tablet Take 1 tablet by mouth once daily   Multiple Vitamins-Minerals (MULTIVITAMIN WITH MINERALS) tablet Take 1 tablet by mouth every other day.   traZODone (DESYREL) 50 MG tablet Take 0.5-1 tablets (25-50 mg total) by mouth at bedtime as needed for sleep.   [DISCONTINUED] amLODipine (NORVASC) 5 MG tablet Take 1 tablet (5 mg total) by mouth daily. (Patient not taking: Reported on 05/26/2023)   No facility-administered encounter medications on file as of 05/26/2023.    Past Surgical History:  Procedure Laterality Date   ABDOMINAL HYSTERECTOMY     BREAST CYST EXCISION Left    67 years old   COLONOSCOPY  2016   [facility]; 1 tubular  adenoma, 1 hyperplastic polyp, TI biopsy with reactive lymphoid hyperplasia without active or chronic enteritis.   COLONOSCOPY N/A 12/02/2021   Procedure: COLONOSCOPY;  Surgeon: Corbin Ade, MD;  Location: [facility];  Service: Endoscopy;  Laterality: N/A;  7:30am   cyst left breast      SALPINGECTOMY Left    TMJ ARTHROPLASTY      Review of Systems  Constitutional:  Negative for chills and fever.  Respiratory:  Negative for shortness of breath.   Cardiovascular:  Positive for leg swelling. Negative for chest pain.  Gastrointestinal:  Negative for abdominal pain.  Neurological:  Negative for dizziness and headaches.      Objective    BP 130/79   Pulse 74   Ht 5\' 10"  (1.778 m)   Wt (!) 312 lb (141.5 kg)   SpO2 96%   BMI 44.77 kg/m   Physical Exam Vitals reviewed.  Constitutional:      General: She is not in acute distress.    Appearance: Normal appearance. She is not ill-appearing, toxic-appearing or diaphoretic.  HENT:     Head: Normocephalic.  Eyes:     General:        Right eye: No discharge.        Left eye: No discharge.     Conjunctiva/sclera: Conjunctivae normal.  Cardiovascular:     Rate and Rhythm: Normal rate.     Pulses: Normal pulses.     Heart  sounds: Normal heart sounds.  Pulmonary:     Effort: Pulmonary effort is normal. No respiratory distress.     Breath sounds: Normal breath sounds.  Musculoskeletal:        General: Normal range of motion.     Cervical back: Normal range of motion.     Right lower leg: Edema present.     Left lower leg: Edema present.  Skin:    General: Skin is warm and dry.     Capillary Refill: Capillary refill takes less than 2 seconds.  Neurological:     General: No focal deficit present.     Mental Status: She is alert and oriented to person, place, and time.     Coordination: Coordination normal.     Gait: Gait normal.  Psychiatric:        Mood and Affect: Mood normal.        Behavior: Behavior normal.        Assessment & Plan:  Primary hypertension Assessment & Plan: Vitals:   05/26/23 0852 05/26/23 0925  BP: 132/78 130/79   Blood pressure controlled in today's visit Patient reported increased leg swellling on amlodipine Discontinue Amlodipine 5 mg Continue with metoprolol 200 mg once day and Lasix 80 mg once day Follow up with Cardiology for worsening symptoms      Return in about 3 months (around 08/26/2023), or Cancel 7/26 appointment Follow up with cardiolgoy, for prediabetes, routine labs.   Cruzita Lederer Newman Nip, [name]

## 2023-05-26 NOTE — Assessment & Plan Note (Signed)
Vitals:   05/26/23 0852 05/26/23 0925  BP: 132/78 130/79   Blood pressure controlled in today's visit Patient reported increased leg swellling on amlodipine Discontinue Amlodipine 5 mg Continue with metoprolol 200 mg once day and Lasix 80 mg once day Follow up with Cardiology for worsening symptoms

## 2023-05-26 NOTE — Patient Instructions (Signed)
        Great to see you today.   - Please take medications as prescribed. - Follow up with your primary health provider if any health concerns arises. - If symptoms worsen please contact your primary care provider and/or visit the emergency department.  

## 2023-06-02 ENCOUNTER — Telehealth: Payer: Self-pay | Admitting: Family Medicine

## 2023-06-02 DIAGNOSIS — I10 Essential (primary) hypertension: Secondary | ICD-10-CM

## 2023-06-02 NOTE — Telephone Encounter (Signed)
Patient calling says she needs her prescriptions changed from Dr Reginia Forts so they will be able to be refilled and states she is needing a refill on her fluid pills, wants to know if they can be a 90 day supply like the other ones. Please advise Thank You

## 2023-06-05 MED ORDER — FUROSEMIDE 80 MG PO TABS: 80.0000 mg | ORAL_TABLET | Freq: Every day | ORAL | 1 refills | Status: DC

## 2023-06-10 ENCOUNTER — Ambulatory Visit: Payer: Medicare HMO | Admitting: Family Medicine

## 2023-06-14 ENCOUNTER — Ambulatory Visit: Payer: Medicare HMO | Attending: Internal Medicine | Admitting: Internal Medicine

## 2023-06-14 ENCOUNTER — Encounter: Payer: Self-pay | Admitting: Internal Medicine

## 2023-06-14 VITALS — BP 142/84 | HR 64 | Ht 70.0 in | Wt 312.0 lb

## 2023-06-14 DIAGNOSIS — I471 Supraventricular tachycardia, unspecified: Secondary | ICD-10-CM

## 2023-06-14 DIAGNOSIS — R002 Palpitations: Secondary | ICD-10-CM | POA: Insufficient documentation

## 2023-06-14 MED ORDER — CARVEDILOL 6.25 MG PO TABS: 6.2500 mg | ORAL_TABLET | Freq: Two times a day (BID) | ORAL | 3 refills | Status: DC

## 2023-06-14 MED ORDER — METOPROLOL SUCCINATE ER 25 MG PO TB24: ORAL_TABLET | ORAL | 0 refills | Status: DC

## 2023-06-14 NOTE — Progress Notes (Signed)
Cardiology Office Note  Date: 06/14/2023   ID: Devine, Bobrowski 13-Feb-1956, MRN [id number]  PCP:  Sonny Masters, [name]  Cardiologist:  Marjo Bicker, MD Electrophysiologist:  None   Reason for Office Visit: f/u pSVT   History of Present Illness: Kristine Mata is a 67 y.o. female known to have HTN, HLD, paroxysmal SVT is here for follow-up visit.  Patient was referred to cardiology clinic in 2019 for evaluation of paroxysmal SVT. EKG showed NSR. She was already on metoprolol succinate at that time. Echocardiogram from 2019 showed normal LVEF and no valvular heart disease. PFO cannot be excluded. Repeat limited echocardiogram showed no PFO.  Event monitor from 04/2021 showed HR range 43-132 BPM with average HR 69 bpm, 1 run of NSVT lasting 5 beats, 2 runs of SVT, longest lasting 13 beats, second-degree heart block with 2.8-second pause (asymptomatic with no patient trigger), <1% PAC and <1% PVC burden. Currently on metoprolol succinate 200 mg once daily and Lasix 80 mg once daily. Continues to have palpitations at night but no palpitations with exertion. She is motivated to lose weight and is joining water aerobic classes through her insurance sometime soon.  No angina or DOE.  No dizziness, syncope or presyncope.  She was on amlodipine previously which was discontinued by PCP due to bilateral lower EXTR swelling.  Past Medical History:  Diagnosis Date   Ectopic pregnancy    Heart murmur    Hyperlipidemia    Hypertension    Insomnia, unspecified    Osteopenia 03/12/2021   Right knee pain    SVT (supraventricular tachycardia)     Past Surgical History:  Procedure Laterality Date   ABDOMINAL HYSTERECTOMY     BREAST CYST EXCISION Left    67 years old   COLONOSCOPY  2016   [facility]; 1 tubular adenoma, 1 hyperplastic polyp, TI biopsy with reactive lymphoid hyperplasia without active or chronic enteritis.   COLONOSCOPY N/A 12/02/2021   Procedure: COLONOSCOPY;  Surgeon:  Corbin Ade, MD;  Location: [facility];  Service: Endoscopy;  Laterality: N/A;  7:30am   cyst left breast      SALPINGECTOMY Left    TMJ ARTHROPLASTY      Current Outpatient Medications  Medication Sig Dispense Refill   albuterol (VENTOLIN HFA) 108 (90 Base) MCG/ACT inhaler Inhale 2 puffs into the lungs every 6 (six) hours as needed for wheezing or shortness of breath. 8 g 2   fluticasone (FLONASE) 50 MCG/ACT nasal spray Place 2 sprays into both nostrils daily. 48 g 1   furosemide (LASIX) 80 MG tablet Take 1 tablet (80 mg total) by mouth daily. 90 tablet 1   loratadine (CLARITIN) 10 MG tablet Take 1 tablet (10 mg total) by mouth daily. 90 tablet 1   lovastatin (MEVACOR) 40 MG tablet Take 1 tablet (40 mg total) by mouth at bedtime. 100 tablet 0   metoprolol (TOPROL-XL) 200 MG 24 hr tablet Take 1 tablet by mouth once daily 90 tablet 1   Multiple Vitamins-Minerals (MULTIVITAMIN WITH MINERALS) tablet Take 1 tablet by mouth every other day.     traZODone (DESYREL) 50 MG tablet Take 0.5-1 tablets (25-50 mg total) by mouth at bedtime as needed for sleep. 30 tablet 2   No current facility-administered medications for this visit.   Allergies:  Anesthesia s-i-40 [propofol], Codeine, Olmesartan, Naproxen, Norvasc [amlodipine], and Tamiflu [oseltamivir]   Social History: The patient  reports that she has never smoked. She has never been  exposed to tobacco smoke. She has never used smokeless tobacco. She reports that she does not drink alcohol and does not use drugs.   Family History: The patient's family history includes COPD in her brother and brother; Colon cancer in her sister and sister; Diabetes in her brother, mother, and sister; Heart disease in her brother and mother; Hypertension in her brother, brother, father, mother, sister, and sister; Lung disease in her brother; Pancreatic cancer in her mother; Stroke in her brother and maternal grandmother; Thyroid disease in her brother and mother.    ROS:  Please see the history of present illness. Otherwise, complete review of systems is positive for none  All other systems are reviewed and negative.   Physical Exam: VS:  There were no vitals taken for this visit., BMI There is no height or weight on file to calculate BMI.  Wt Readings from Last 3 Encounters:  05/26/23 (!) 312 lb (141.5 kg)  05/13/23 (!) 310 lb 0.6 oz (140.6 kg)  03/23/23 (!) 314 lb 6.4 oz (142.6 kg)    General: Patient appears comfortable at rest. HEENT: Conjunctiva and lids normal, oropharynx clear with moist mucosa. Neck: Supple, no elevated JVP or carotid bruits, no thyromegaly. Lungs: Clear to auscultation, nonlabored breathing at rest. Cardiac: Regular rate and rhythm, no S3 or significant systolic murmur, no pericardial rub. Abdomen: Soft, nontender, no hepatomegaly, bowel sounds present, no guarding or rebound. Extremities: No pitting edema, distal pulses 2+. Skin: Warm and dry. Musculoskeletal: No kyphosis. Neuropsychiatric: Alert and oriented x3, affect grossly appropriate.  Recent Labwork: 12/14/2022: Magnesium 2.4 12/23/2022: ALT 15; AST 21 03/21/2023: TSH 0.50 03/23/2023: BUN 16; Creatinine, Ser 0.65; Potassium 4.0; Sodium 142 05/20/2023: Hemoglobin 12.2; Platelets 296     Component Value Date/Time   CHOL 179 05/20/2023 1013   TRIG 78 05/20/2023 1013   HDL 70 05/20/2023 1013   CHOLHDL 2.6 05/20/2023 1013   LDLCALC 95 05/20/2023 1013    Other Studies Reviewed Today:   Assessment and Plan:  # Paroxysmal SVT # Palpitations -Patient was initially referred to cardiology clinic in 2019 for evaluation of palpitations at night. EKG showed NSR and no evidence of arrhythmias. Event monitor from 04/2021 showed HR range 43-132 BPM with average HR 69 bpm, 1 run of NSVT lasting 5 beats, 2 runs of SVT, longest lasting 13 beats, second-degree heart block with 2.8-second pause (asymptomatic with no patient trigger), <1% PAC and <1% PVC burden.  -Wean off  metoprolol succinate (100 mg x 1 week, 50 mg x 1 week, 25 mg x 1 week and then discontinue) and start carvedilol 6.25 mg twice daily (mainly for HTN control). -I believe her palpitations are benign and probably might resolve completely with weight loss.  # HTN, partially controlled -Amlodipine was recently discontinued by PCP due to to bilateral lower EXTR swelling.  Wean off metoprolol succinate (100 mg x 1 week, 50 mg x 1 week, 25 mg x 1 week and then discontinue) and start carvedilol 6.25 mg twice daily.  On Lasix 80 mg once daily.  # HLD -Continue lovastatin 40 mg at bedtime, goal LDL less than 100.  Follows with PCP.  I have spent a total of 30 minutes with patient reviewing chart, EKGs, labs and examining patient as well as establishing an assessment and plan that was discussed with the patient.  > 50% of time was spent in direct patient care.    Medication Adjustments/Labs and Tests Ordered: Current medicines are reviewed at length with the patient  today.  Concerns regarding medicines are outlined above.   Tests Ordered: Orders Placed This Encounter  Procedures   EKG 12-Lead    Medication Changes: Meds ordered this encounter  Medications   metoprolol succinate (TOPROL XL) 25 MG 24 hr tablet    Sig: Take 4 tablets (100 mg total) by mouth daily for 7 days, THEN 2 tablets (50 mg total) daily for 7 days, THEN 1 tablet (25 mg total) daily for 7 days. THEN STOP.    Dispense:  50 tablet    Refill:  0   carvedilol (COREG) 6.25 MG tablet    Sig: Take 1 tablet (6.25 mg total) by mouth 2 (two) times daily. Start AFTER completing metoprolol for 3 weeks    Dispense:  180 tablet    Refill:  3      Disposition:  Follow up  2 year  Signed Emelio Schneller Verne Spurr, MD, 06/14/2023 9:25 AM    [facility] [address] [place], [place], [place] [address]

## 2023-06-14 NOTE — Patient Instructions (Addendum)
Medication Instructions:  Your physician has recommended you make the following change in your medication:  For one week take Metoprolol succinate 100 mg  2nd week take Metoprolol succinate 50 mg 3rd week take Metoprolol succinate 25 mg  Then stop taking Metoprolol-Start Coreg 6.25 mg twice a day  Labwork: None  Testing/Procedures: None  Follow-Up: Your physician recommends that you schedule a follow-up appointment in: 2 years. You will receive a reminder call in about 18 months reminding you to schedule your appointment. If you don't receive this call, please contact our office.   Any Other Special Instructions Will Be Listed Below (If Applicable).  If you need a refill on your cardiac medications before your next appointment, please call your pharmacy.

## 2023-06-16 ENCOUNTER — Telehealth: Payer: Self-pay | Admitting: "Endocrinology

## 2023-06-16 NOTE — Telephone Encounter (Signed)
Patient is calling to ask if her appointment for 06/23/2023 can be a virtual visit.  Patient states that she does have transportation for her lab appointment, but is unable to find transportation for her 06/23/2023 visit.  Patient states that her car is not running.

## 2023-06-16 NOTE — Telephone Encounter (Signed)
Yes Kristine Mata it can be

## 2023-06-17 ENCOUNTER — Telehealth: Payer: Self-pay | Admitting: Internal Medicine

## 2023-06-17 NOTE — Telephone Encounter (Signed)
Pt c/o medication issue:  1. Name of Medication: furosemide (LASIX) 80 MG tablet   2. How are you currently taking this medication (dosage and times per day)?    3. Are you having a reaction (difficulty breathing--STAT)? No   4. What is your medication issue? Patient calling to see if she is suppose to still take the medication. She is not sure since she suppose stop taking the metoprolol. Please advise

## 2023-06-17 NOTE — Telephone Encounter (Signed)
Patient notified to continue Furosemide 80 mg tablets once daily as prescribed by provider. Pt also repeated back instructions for weaning off Metoprolol prior to starting Coreg. Pt had no further questions or concerns at this time.

## 2023-06-17 NOTE — Telephone Encounter (Signed)
#   HTN, partially controlled -Amlodipine was recently discontinued by PCP due to to bilateral lower EXTR swelling.  Wean off metoprolol succinate (100 mg x 1 week, 50 mg x 1 week, 25 mg x 1 week and then discontinue) and start carvedilol 6.25 mg twice daily.  On Lasix 80 mg once daily.

## 2023-06-20 ENCOUNTER — Other Ambulatory Visit (INDEPENDENT_AMBULATORY_CARE_PROVIDER_SITE_OTHER): Payer: Medicare HMO

## 2023-06-20 DIAGNOSIS — E210 Primary hyperparathyroidism: Secondary | ICD-10-CM

## 2023-06-20 DIAGNOSIS — E559 Vitamin D deficiency, unspecified: Secondary | ICD-10-CM | POA: Diagnosis not present

## 2023-06-20 LAB — RENAL FUNCTION PANEL
Albumin: 4.6 g/dL (ref 3.5–5.2)
BUN: 14 mg/dL (ref 6–23)
CO2: 29 mEq/L (ref 19–32)
Calcium: 11.1 mg/dL — ABNORMAL HIGH (ref 8.4–10.5)
Chloride: 102 mEq/L (ref 96–112)
Creatinine, Ser: 0.69 mg/dL (ref 0.40–1.20)
GFR: 89.17 mL/min (ref >60.00)
Glucose, Bld: 97 mg/dL (ref 70–99)
Phosphorus: 3.5 mg/dL (ref 2.3–4.6)
Potassium: 4.1 mEq/L (ref 3.5–5.1)
Sodium: 141 mEq/L (ref 135–145)

## 2023-06-20 LAB — MAGNESIUM: Magnesium: 2.5 mg/dL (ref 1.5–2.5)

## 2023-06-20 LAB — VITAMIN D 25 HYDROXY (VIT D DEFICIENCY, FRACTURES): VITD: 26.13 ng/mL — ABNORMAL LOW (ref 30.00–100.00)

## 2023-06-22 ENCOUNTER — Ambulatory Visit: Payer: Medicare HMO | Admitting: Family Medicine

## 2023-06-23 ENCOUNTER — Telehealth: Payer: Medicare HMO | Admitting: "Endocrinology

## 2023-06-23 NOTE — Progress Notes (Unsigned)
Outpatient Endocrinology Note Altamese Fletcher, MD    Kristine Mata 03-08-1956 [id number]  Referring Provider: Sonny Masters, [name] Primary Care Provider: Rica Records, [name] Reason for consultation: Subjective   Assessment & Plan  Diagnoses and all orders for this visit:  Hyperparathyroidism, primary [facility])  Hypercalcemia  Osteopenia of neck of right femur   Primary hyperparathyroidism, c/o intermittent abdominal pain, osteopenia per 2022 DXA 24 hr urine Ca 12/2022 was 333 (no total urine mentioned)  02/2023 NM PARATHYROID SCINTIGRAPHY AND SPECT IMAGING  showed activity localized to the inferior posterior LEFT lobe of thyroid 02/2021 DXA showed T score -1.2 right femur    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 Patient is not keen on surgery No indication for surgery currently  Pt wants to monitor calcium clinically  Ordered DXA and labs  Discussed importance of good hydration to prevent severe hypercalcemia:  - 8 eight ounce glasses of fluids per day.  - low threshold for ER visit for IV fluids if having     nausea/vomiting/diarrhea and cannot keep well hydrated.   No follow-ups on file.   I have reviewed current medications, nurse's notes, allergies, vital signs, past medical and surgical history, family medical history, and social history for this encounter. Counseled patient on symptoms, examination findings, lab findings, imaging results, treatment decisions and monitoring and prognosis. The patient understood the recommendations and agrees with the treatment plan. All questions regarding treatment plan were fully answered.  Altamese [place], MD  06/23/23   History of Present Illness HPI  Kristine Mata is a 67 y.o. female referred by Dr. Reginia Forts for evaluation and management of hypercalcemia.    She was found to have elevated serum calcium of 10.4 (corrected for 12/2022) mg/dL in with a high intact PTH level of 108 pg/ml. Patient has had a high  Calcium at least since 08/2021.    Patient denies a history of kidney stones.  She  current hematuria No Polyuria No Nocturia Once a night  Excessive Thirst  renal failure No Anorexia No abdominal pain yes, twice a week Heartburn No constipation sometimes  nausea or vomiting no  h/o peptic ulcer disease no  depression no  confusion no excessive fatigue no fracture yes, ground level in 2015-2016 osteoporosis no, osteopenia  Headaches sometimes Numbness no Tingling yes  She  take Calcium No She  take Vitamin D supplements No  She denies a history of taking chronic lithium No She denies a history of thiazide diuretics.   She denies self/family history of renal stones/hypercalcemia (except niece) denies a personal history of MEN syndromes/medullary thyroid cancer/ pheochromocytoma  Physical Exam  There were no vitals taken for this visit.   Constitutional: well developed, well nourished Head: normocephalic, atraumatic Eyes: sclera anicteric, no redness Neck: supple Lungs: normal respiratory effort Neurology: alert and oriented Skin: dry, no appreciable rashes Musculoskeletal: no appreciable defects Psychiatric: normal mood and affect   Current Medications Patient's Medications  New Prescriptions   No medications on file  Previous Medications   ALBUTEROL (VENTOLIN HFA) 108 (90 BASE) MCG/ACT INHALER    Inhale 2 puffs into the lungs every 6 (six) hours as needed for wheezing or shortness of breath.   CARVEDILOL (COREG) 6.25 MG TABLET    Take 1 tablet (6.25 mg total) by mouth 2 (two) times daily. Start AFTER completing metoprolol for 3 weeks   FLUTICASONE (FLONASE) 50 MCG/ACT NASAL SPRAY    Place 2 sprays into both nostrils daily.  FUROSEMIDE (LASIX) 80 MG TABLET    Take 1 tablet (80 mg total) by mouth daily.   LORATADINE (CLARITIN) 10 MG TABLET    Take 1 tablet (10 mg total) by mouth daily.   LOVASTATIN (MEVACOR) 40 MG TABLET    Take 1 tablet (40 mg total) by mouth  at bedtime.   METOPROLOL SUCCINATE (TOPROL XL) 25 MG 24 HR TABLET    Take 4 tablets (100 mg total) by mouth daily for 7 days, THEN 2 tablets (50 mg total) daily for 7 days, THEN 1 tablet (25 mg total) daily for 7 days. THEN STOP.   MULTIPLE VITAMINS-MINERALS (MULTIVITAMIN WITH MINERALS) TABLET    Take 1 tablet by mouth every other day.   TRAZODONE (DESYREL) 50 MG TABLET    Take 0.5-1 tablets (25-50 mg total) by mouth at bedtime as needed for sleep.  Modified Medications   No medications on file  Discontinued Medications   No medications on file    Allergies Allergies  Allergen Reactions  . Anesthesia S-I-40 [Propofol] Nausea And Vomiting  . Codeine Nausea And Vomiting  . Olmesartan     Edema in feet  . Naproxen Palpitations  . Norvasc [Amlodipine] Other (See Comments)    Edema at 2.5 mg dosage.  . Tamiflu [Oseltamivir] Rash and Cough    Rash, Cough, and Wheezing    Past Medical History Past Medical History:  Diagnosis Date  . Ectopic pregnancy   . Heart murmur   . Hyperlipidemia   . Hypertension   . Insomnia, unspecified   . Osteopenia 03/12/2021  . Right knee pain   . SVT (supraventricular tachycardia)     Past Surgical History Past Surgical History:  Procedure Laterality Date  . ABDOMINAL HYSTERECTOMY    . BREAST CYST EXCISION Left    67 years old  . COLONOSCOPY  2016   [facility]; 1 tubular adenoma, 1 hyperplastic polyp, TI biopsy with reactive lymphoid hyperplasia without active or chronic enteritis.  . COLONOSCOPY N/A 12/02/2021   Procedure: COLONOSCOPY;  Surgeon: Corbin Ade, MD;  Location: [facility];  Service: Endoscopy;  Laterality: N/A;  7:30am  . cyst left breast     . SALPINGECTOMY Left   . TMJ ARTHROPLASTY      Family History family history includes COPD in her brother and brother; Colon cancer in her sister and sister; Diabetes in her brother, mother, and sister; Heart disease in her brother and mother; Hypertension in her brother, brother, father,  mother, sister, and sister; Lung disease in her brother; Pancreatic cancer in her mother; Stroke in her brother and maternal grandmother; Thyroid disease in her brother and mother.  Social History Social History   Socioeconomic History  . Marital status: Divorced    Spouse name: Not on file  . Number of children: 1  . Years of education: 57  . Highest education level: 12th grade  Occupational History  . Occupation: Retired  Tobacco Use  . Smoking status: Never    Passive exposure: Never  . Smokeless tobacco: Never  Vaping Use  . Vaping status: Never Used  Substance and Sexual Activity  . Alcohol use: Never  . Drug use: Never  . Sexual activity: Not Currently    Partners: Male    Birth control/protection: Surgical  Other Topics Concern  . Not on file  Social History Narrative   Lives alone. Has one son - lives in [place].   Social Determinants of Health   Financial Resource Strain: Low Risk  (  03/16/2023)   Overall Financial Resource Strain (CARDIA)   . Difficulty of Paying Living Expenses: Not hard at all  Food Insecurity: No Food Insecurity (03/16/2023)   Hunger Vital Sign   . Worried About Programme researcher, broadcasting/film/video in the Last Year: Never true   . Ran Out of Food in the Last Year: Never true  Transportation Needs: No Transportation Needs (03/16/2023)   PRAPARE - Transportation   . Lack of Transportation (Medical): No   . Lack of Transportation (Non-Medical): No  Physical Activity: Inactive (03/16/2023)   Exercise Vital Sign   . Days of Exercise per Week: 0 days   . Minutes of Exercise per Session: 0 min  Stress: No Stress Concern Present (03/16/2023)   [employer] of Occupational Health - Occupational Stress Questionnaire   . Feeling of Stress : Only a little  Social Connections: Moderately Isolated (03/16/2023)   Social Connection and Isolation Panel [NHANES]   . Frequency of Communication with Friends and Family: More than three times a week   . Frequency of Social  Gatherings with Friends and Family: More than three times a week   . Attends Religious Services: More than 4 times per year   . Active Member of Clubs or Organizations: No   . Attends Banker Meetings: Never   . Marital Status: Divorced  Catering manager Violence: Not At Risk (03/16/2023)   Humiliation, Afraid, Rape, and Kick questionnaire   . Fear of Current or Ex-Partner: No   . Emotionally Abused: No   . Physically Abused: No   . Sexually Abused: No    Lab Results  Component Value Date   CHOL 179 05/20/2023   Lab Results  Component Value Date   HDL 70 05/20/2023   Lab Results  Component Value Date   LDLCALC 95 05/20/2023   Lab Results  Component Value Date   TRIG 78 05/20/2023   Lab Results  Component Value Date   CHOLHDL 2.6 05/20/2023   Lab Results  Component Value Date   CREATININE 0.69 06/20/2023   Lab Results  Component Value Date   GFR 89.17 06/20/2023      Component Value Date/Time   NA 141 06/20/2023 1401   NA 142 12/23/2022 1110   K 4.1 06/20/2023 1401   CL 102 06/20/2023 1401   CO2 29 06/20/2023 1401   GLUCOSE 97 06/20/2023 1401   BUN 14 06/20/2023 1401   BUN 12 12/23/2022 1110   CREATININE 0.69 06/20/2023 1401   CALCIUM 11.1 (H) 06/20/2023 1401   CALCIUM 10.7 (H) 06/20/2023 1401   PROT 7.0 12/23/2022 1110   ALBUMIN 4.6 06/20/2023 1401   ALBUMIN 4.4 12/23/2022 1110   AST 21 12/23/2022 1110   ALT 15 12/23/2022 1110   ALKPHOS 167 (H) 12/23/2022 1110   BILITOT 0.3 12/23/2022 1110   GFRNONAA >60 08/04/2022 2143   GFRAA 106 07/15/2020 1419      Latest Ref Rng & Units 06/20/2023    2:01 PM 03/23/2023    3:00 PM 12/23/2022   11:10 AM  BMP  Glucose 70 - 99 mg/dL 97  811  914   BUN 6 - 23 mg/dL 14  16  12    Creatinine 0.40 - 1.20 mg/dL 7.82  9.56  2.13   BUN/Creat Ratio 12 - 28   19   Sodium 135 - 145 mEq/L 141  142  142   Potassium 3.5 - 5.1 mEq/L 4.1  4.0  4.3   Chloride 96 -  112 mEq/L 102  104  104   CO2 19 - 32 mEq/L 29  27  24     Calcium 8.4 - 10.5 mg/dL 8.7 - 01.0 mg/dL 27.2    53.6  64.4    03.4  10.7        Component Value Date/Time   WBC 6.1 05/20/2023 1013   WBC 7.8 08/04/2022 2143   RBC 4.32 05/20/2023 1013   RBC 4.46 08/04/2022 2143   HGB 12.2 05/20/2023 1013   HCT 39.0 05/20/2023 1013   PLT 296 05/20/2023 1013   MCV 90 05/20/2023 1013   MCH 28.2 05/20/2023 1013   MCH 29.4 08/04/2022 2143   MCHC 31.3 (L) 05/20/2023 1013   MCHC 32.1 08/04/2022 2143   RDW 13.8 05/20/2023 1013   LYMPHSABS 2.7 05/20/2023 1013   MONOABS 0.6 08/04/2022 2143   EOSABS 0.1 05/20/2023 1013   BASOSABS 0.0 05/20/2023 1013   Lab Results  Component Value Date   TSH 0.50 03/21/2023   TSH 0.578 12/23/2022   TSH 0.633 10/14/2022   FREET4 0.88 03/21/2023       Parts of this note may have been dictated using voice recognition software. There may be variances in spelling and vocabulary which are unintentional. Not all errors are proofread. Please notify the Thereasa Parkin if any discrepancies are noted or if the meaning of any statement is not clear.

## 2023-06-24 ENCOUNTER — Ambulatory Visit: Payer: Medicare HMO | Admitting: Family Medicine

## 2023-06-29 ENCOUNTER — Telehealth (INDEPENDENT_AMBULATORY_CARE_PROVIDER_SITE_OTHER): Payer: Medicare HMO | Admitting: "Endocrinology

## 2023-06-29 ENCOUNTER — Encounter: Payer: Self-pay | Admitting: "Endocrinology

## 2023-06-29 DIAGNOSIS — E210 Primary hyperparathyroidism: Secondary | ICD-10-CM | POA: Diagnosis not present

## 2023-06-29 DIAGNOSIS — E559 Vitamin D deficiency, unspecified: Secondary | ICD-10-CM | POA: Diagnosis not present

## 2023-06-29 NOTE — Progress Notes (Addendum)
The patient reports they are currently: Kristine Mata. I spent 10 minutes on the video with the patient on the date of service. I spent an additional 10 minutes on pre- and post-visit activities on the date of service.   The patient was physically located in [place] or a state in which I am permitted to provide care. The patient and/or parent/guardian understood that s/he may incur co-pays and cost sharing, and agreed to the telemedicine visit. The visit was reasonable and appropriate under the circumstances given the patient's presentation at the time.  The patient and/or parent/guardian has been advised of the potential risks and limitations of this mode of treatment (including, but not limited to, the absence of in-person examination) and has agreed to be treated using telemedicine. The patient's/patient's family's questions regarding telemedicine have been answered.   The patient and/or parent/guardian has also been advised to contact their provider's office for worsening conditions, and seek emergency medical treatment and/or call 911 if the patient deems either necessary.    Outpatient Endocrinology Note Altamese [place], MD    CHIKA MARIE 11/23/1955 [id number]  Referring Provider: Sonny Masters, [name] Primary Care Provider: Rica Records, [name] Reason for consultation: Subjective   Assessment & Plan  Diagnoses and all orders for this visit:  Hyperparathyroidism, primary (HCC) -     DG BONE DENSITY (DXA)  Hypercalcemia -     DG BONE DENSITY (DXA)  Vitamin D deficiency    Primary hyperparathyroidism, c/o intermittent abdominal pain, osteopenia per 2022 DXA 24 hr urine Ca 12/2022 was 333 (no total urine mentioned)  02/2023 NM PARATHYROID SCINTIGRAPHY AND SPECT IMAGING showed activity localized to the inferior posterior LEFT lobe of thyroid 02/2021 DXA showed T score -1.2 right femur    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 Patient is not keen on surgery No  indication for surgery currently: calcium stale at 10.7 with continued PTH elevated Recommend 1000 international units Vit D for 1 mo   Pt wants to monitor calcium clinically  Ordered DXA: pending  Discussed importance of good hydration to prevent severe hypercalcemia:  - 8 eight ounce glasses of fluids per day.  - low threshold for ER visit for IV fluids if having     nausea/vomiting/diarrhea and cannot keep well hydrated.   Return in about 6 months (around 12/30/2023) for tele-visit: last appointment of the day, labs before next visit.   I have reviewed current medications, nurse's notes, allergies, vital signs, past medical and surgical history, family medical history, and social history for this encounter. Counseled patient on symptoms, examination findings, lab findings, imaging results, treatment decisions and monitoring and prognosis. The patient understood the recommendations and agrees with the treatment plan. All questions regarding treatment plan were fully answered.  Altamese [place], MD  06/29/23   History of Present Illness HPI  Kristine Mata is a 67 y.o. female referred by Dr. Reginia Forts for follow up for primary hyperparathyroidism driven hypercalcemia.    She was found to have elevated serum calcium of 10.4 (corrected for 12/2022) mg/dL in with a high intact PTH level of 108 pg/ml. Patient has had a high Calcium at least since 08/2021.    Component     Latest Ref Rng 06/20/2023  Sodium     135 - 145 mEq/L 141   Potassium     3.5 - 5.1 mEq/L 4.1   Chloride     96 - 112 mEq/L 102   CO2     19 -  32 mEq/L 29   Albumin     3.5 - 5.2 g/dL 4.6   BUN     6 - 23 mg/dL 14   Creatinine     1.61 - 1.20 mg/dL 0.96   Glucose     70 - 99 mg/dL 97   Phosphorus     2.3 - 4.6 mg/dL 3.5   GFR     >04.54 mL/min 89.17   Calcium     8.4 - 10.5 mg/dL 09.8 (H)   Calcium     8.7 - 10.3 mg/dL 11.9 (H)   Vitamin D 1, 25 (OH) Total     18 - 72 pg/mL 97 (H)   Vitamin D3 1, 25 (OH)      pg/mL 97   Vitamin D2 1, 25 (OH)     pg/mL <8   PTH, Intact     15 - 65 pg/mL 133 (H)   PTH Interp Comment   VITD     30.00 - 100.00 ng/mL 26.13 (L)     No new symptoms Patient feel well except BP issues being managed by another provider   Initial history:   Patient denies a history of kidney stones.  She  current hematuria No Polyuria No Nocturia Once a night  Excessive Thirst  renal failure No Anorexia No abdominal pain yes, twice a week Heartburn No constipation sometimes  nausea or vomiting no  h/o peptic ulcer disease no  depression no  confusion no excessive fatigue no fracture yes, ground level in 2015-2016 osteoporosis no, osteopenia  Headaches sometimes Numbness no Tingling yes  She  take Calcium No She  take Vitamin D supplements No  She denies a history of taking chronic lithium No She denies a history of thiazide diuretics.   She denies self/family history of renal stones/hypercalcemia (except niece) denies a personal history of MEN syndromes/medullary thyroid cancer/ pheochromocytoma  Physical Exam  There were no vitals taken for this visit.   Constitutional: well developed, well nourished Head: normocephalic, atraumatic Eyes: sclera anicteric, no redness Neck: supple Lungs: normal respiratory effort Neurology: alert and oriented Skin: dry, no appreciable rashes Musculoskeletal: no appreciable defects Psychiatric: normal mood and affect   Current Medications Patient's Medications  New Prescriptions   No medications on file  Previous Medications   ALBUTEROL (VENTOLIN HFA) 108 (90 BASE) MCG/ACT INHALER    Inhale 2 puffs into the lungs every 6 (six) hours as needed for wheezing or shortness of breath.   CARVEDILOL (COREG) 6.25 MG TABLET    Take 1 tablet (6.25 mg total) by mouth 2 (two) times daily. Start AFTER completing metoprolol for 3 weeks   FLUTICASONE (FLONASE) 50 MCG/ACT NASAL SPRAY    Place 2 sprays into both nostrils daily.    FUROSEMIDE (LASIX) 80 MG TABLET    Take 1 tablet (80 mg total) by mouth daily.   LORATADINE (CLARITIN) 10 MG TABLET    Take 1 tablet (10 mg total) by mouth daily.   LOVASTATIN (MEVACOR) 40 MG TABLET    Take 1 tablet (40 mg total) by mouth at bedtime.   METOPROLOL SUCCINATE (TOPROL XL) 25 MG 24 HR TABLET    Take 4 tablets (100 mg total) by mouth daily for 7 days, THEN 2 tablets (50 mg total) daily for 7 days, THEN 1 tablet (25 mg total) daily for 7 days. THEN STOP.   MULTIPLE VITAMINS-MINERALS (MULTIVITAMIN WITH MINERALS) TABLET    Take 1 tablet by mouth every other day.  TRAZODONE (DESYREL) 50 MG TABLET    Take 0.5-1 tablets (25-50 mg total) by mouth at bedtime as needed for sleep.  Modified Medications   No medications on file  Discontinued Medications   No medications on file    Allergies Allergies  Allergen Reactions   Anesthesia S-I-40 [Propofol] Nausea And Vomiting   Codeine Nausea And Vomiting   Olmesartan     Edema in feet   Naproxen Palpitations   Norvasc [Amlodipine] Other (See Comments)    Edema at 2.5 mg dosage.   Tamiflu [Oseltamivir] Rash and Cough    Rash, Cough, and Wheezing    Past Medical History Past Medical History:  Diagnosis Date   Ectopic pregnancy    Heart murmur    Hyperlipidemia    Hypertension    Insomnia, unspecified    Osteopenia 03/12/2021   Right knee pain    SVT (supraventricular tachycardia)     Past Surgical History Past Surgical History:  Procedure Laterality Date   ABDOMINAL HYSTERECTOMY     BREAST CYST EXCISION Left    67 years old   COLONOSCOPY  2016   [facility]; 1 tubular adenoma, 1 hyperplastic polyp, TI biopsy with reactive lymphoid hyperplasia without active or chronic enteritis.   COLONOSCOPY N/A 12/02/2021   Procedure: COLONOSCOPY;  Surgeon: Corbin Ade, MD;  [facility];  Service: Endoscopy;  Laterality: N/A;  7:30am   cyst left breast      SALPINGECTOMY Left    TMJ ARTHROPLASTY      Family History family  history includes COPD in her brother and brother; Colon cancer in her sister and sister; Diabetes in her brother, mother, and sister; Heart disease in her brother and mother; Hypertension in her brother, brother, father, mother, sister, and sister; Lung disease in her brother; Pancreatic cancer in her mother; Stroke in her brother and maternal grandmother; Thyroid disease in her brother and mother.  Social History Social History   Socioeconomic History   Marital status: Divorced    Spouse name: Not on file   Number of children: 1   Years of education: 12   Highest education level: 12th grade  Occupational History   Occupation: Retired  Tobacco Use   Smoking status: Never    Passive exposure: Never   Smokeless tobacco: Never  Vaping Use   Vaping status: Never Used  Substance and Sexual Activity   Alcohol use: Never   Drug use: Never   Sexual activity: Not Currently    Partners: Male    Birth control/protection: Surgical  Other Topics Concern   Not on file  Social History Narrative   Lives alone. Has one son - lives in [place].   Social Determinants of Health   Financial Resource Strain: Low Risk  (03/16/2023)   Overall Financial Resource Strain (CARDIA)    Difficulty of Paying Living Expenses: Not hard at all  Food Insecurity: No Food Insecurity (03/16/2023)   Hunger Vital Sign    Worried About Running Out of Food in the Last Year: Never true    Ran Out of Food in the Last Year: Never true  Transportation Needs: No Transportation Needs (03/16/2023)   PRAPARE - Administrator, Civil Service (Medical): No    Lack of Transportation (Non-Medical): No  Physical Activity: Inactive (03/16/2023)   Exercise Vital Sign    Days of Exercise per Week: 0 days    Minutes of Exercise per Session: 0 min  Stress: No Stress Concern Present (03/16/2023)  [employer] of Occupational Health - Occupational Stress Questionnaire    Feeling of Stress : Only a little  Social Connections:  Moderately Isolated (03/16/2023)   Social Connection and Isolation Panel [NHANES]    Frequency of Communication with Friends and Family: More than three times a week    Frequency of Social Gatherings with Friends and Family: More than three times a week    Attends Religious Services: More than 4 times per year    Active Member of Clubs or Organizations: No    Attends Banker Meetings: Never    Marital Status: Divorced  Catering manager Violence: Not At Risk (03/16/2023)   Humiliation, Afraid, Rape, and Kick questionnaire    Fear of Current or Ex-Partner: No    Emotionally Abused: No    Physically Abused: No    Sexually Abused: No    Lab Results  Component Value Date   CHOL 179 05/20/2023   Lab Results  Component Value Date   HDL 70 05/20/2023   Lab Results  Component Value Date   LDLCALC 95 05/20/2023   Lab Results  Component Value Date   TRIG 78 05/20/2023   Lab Results  Component Value Date   CHOLHDL 2.6 05/20/2023   Lab Results  Component Value Date   CREATININE 0.69 06/20/2023   Lab Results  Component Value Date   GFR 89.17 06/20/2023      Component Value Date/Time   NA 141 06/20/2023 1401   NA 142 12/23/2022 1110   K 4.1 06/20/2023 1401   CL 102 06/20/2023 1401   CO2 29 06/20/2023 1401   GLUCOSE 97 06/20/2023 1401   BUN 14 06/20/2023 1401   BUN 12 12/23/2022 1110   CREATININE 0.69 06/20/2023 1401   CALCIUM 11.1 (H) 06/20/2023 1401   CALCIUM 10.7 (H) 06/20/2023 1401   PROT 7.0 12/23/2022 1110   ALBUMIN 4.6 06/20/2023 1401   ALBUMIN 4.4 12/23/2022 1110   AST 21 12/23/2022 1110   ALT 15 12/23/2022 1110   ALKPHOS 167 (H) 12/23/2022 1110   BILITOT 0.3 12/23/2022 1110   GFRNONAA >60 08/04/2022 2143   GFRAA 106 07/15/2020 1419      Latest Ref Rng & Units 06/20/2023    2:01 PM 03/23/2023    3:00 PM 12/23/2022   11:10 AM  BMP  Glucose 70 - 99 mg/dL 97  478  295   BUN 6 - 23 mg/dL 14  16  12    Creatinine 0.40 - 1.20 mg/dL 6.21  3.08  6.57    BUN/Creat Ratio 12 - 28   19   Sodium 135 - 145 mEq/L 141  142  142   Potassium 3.5 - 5.1 mEq/L 4.1  4.0  4.3   Chloride 96 - 112 mEq/L 102  104  104   CO2 19 - 32 mEq/L 29  27  24    Calcium 8.4 - 10.5 mg/dL 8.7 - 84.6 mg/dL 96.2    95.2  84.1    32.4  10.7        Component Value Date/Time   WBC 6.1 05/20/2023 1013   WBC 7.8 08/04/2022 2143   RBC 4.32 05/20/2023 1013   RBC 4.46 08/04/2022 2143   HGB 12.2 05/20/2023 1013   HCT 39.0 05/20/2023 1013   PLT 296 05/20/2023 1013   MCV 90 05/20/2023 1013   MCH 28.2 05/20/2023 1013   MCH 29.4 08/04/2022 2143   MCHC 31.3 (L) 05/20/2023 1013   MCHC 32.1 08/04/2022 2143  RDW 13.8 05/20/2023 1013   LYMPHSABS 2.7 05/20/2023 1013   MONOABS 0.6 08/04/2022 2143   EOSABS 0.1 05/20/2023 1013   BASOSABS 0.0 05/20/2023 1013   Lab Results  Component Value Date   TSH 0.50 03/21/2023   TSH 0.578 12/23/2022   TSH 0.633 10/14/2022   FREET4 0.88 03/21/2023       Parts of this note may have been dictated using voice recognition software. There may be variances in spelling and vocabulary which are unintentional. Not all errors are proofread. Please notify the Thereasa Parkin if any discrepancies are noted or if the meaning of any statement is not clear.

## 2023-06-29 NOTE — Telephone Encounter (Signed)
Reports taking toporl xl tapering dosage as directed and willl start toprol xl 25 mg daily tomorrow for 1 week, then stop and start carvedilol 6.25 mg BID. Reports BP's listed below have been taken at different times of the day. Denies dizziness, chest pain or SOB. Reports having a faster HR and headache at night at times. Reports seeing black dots at times and says she's had this for a while and has seen her eye doctor for this problem. Denies active symptoms. Advised that its normal to have a slight increase in BP & HR due to toprol xl tapering scheduled. Advised to start checking her BP daily, same time, same arm, same cuff, 1-2 hours after taking medications and resting 5-10 minutes. Advised to contact office for readings after 1 week. Also advised if she develops worsening symptoms, to go to the ED for an evaluation. Verbalized understanding of plan.

## 2023-06-29 NOTE — Telephone Encounter (Signed)
Pt c/o BP issue: STAT if pt c/o blurred vision, one-sided weakness or slurred speech  1. What are your last 5 BP readings?   150/86 145/85 154/90 169/92 150/93  2. Are you having any other symptoms (ex. Dizziness, headache, blurred vision, passed out)?  Headache and neck ache  3. What is your BP issue?   Patient stated her BP has been trending higher and is concerned she has been reducing her metoprolol medication.

## 2023-06-30 ENCOUNTER — Other Ambulatory Visit: Payer: Self-pay

## 2023-07-05 ENCOUNTER — Ambulatory Visit: Payer: Medicare HMO | Admitting: Internal Medicine

## 2023-07-07 ENCOUNTER — Other Ambulatory Visit: Payer: Self-pay | Admitting: *Deleted

## 2023-07-07 ENCOUNTER — Telehealth: Payer: Self-pay | Admitting: Internal Medicine

## 2023-07-07 ENCOUNTER — Ambulatory Visit: Payer: Medicare HMO | Admitting: Family Medicine

## 2023-07-07 NOTE — Telephone Encounter (Signed)
Pt c/o medication issue:  1. Name of Medication:  carvedilol (COREG) 6.25 MG tablet   2. How are you currently taking this medication (dosage and times per day)?   3. Are you having a reaction (difficulty breathing--STAT)? no  4. What is your medication issue?  Patient states she started this medication yesterday, and she states she was having palpitations worst than she ever had before, and having left jaw pain, and a little bit of left arm pain.  She states she didn't sleep well last night.

## 2023-07-07 NOTE — Telephone Encounter (Signed)
Spoke with patient - states she had episode last evening that lasted about 5 minutes.  Last dose of the Toprol was on 07/05/23 & started the Coreg on 07/06/2023.  Woke her up - said she chewed a baby ASA because she didn't know what else to do.  Stated most of the times it happens in middle of night, sometimes has crazy dreams & then she wakes up with rapid heart beat.   No other c/o chest pain, dizziness or sob.  BP this morning was 142/87  79 & then checked again at 1:00 pm - 158/89  79.  No increased palpitations outside of norm for her.

## 2023-07-07 NOTE — Telephone Encounter (Signed)
Patient notified and verbalized understanding. 

## 2023-07-28 ENCOUNTER — Telehealth: Payer: Self-pay

## 2023-07-28 ENCOUNTER — Encounter: Payer: Self-pay | Admitting: Internal Medicine

## 2023-07-28 ENCOUNTER — Ambulatory Visit: Payer: Medicare HMO | Attending: Internal Medicine | Admitting: Internal Medicine

## 2023-07-28 VITALS — BP 132/82 | HR 79 | Ht 70.0 in | Wt 308.2 lb

## 2023-07-28 DIAGNOSIS — G4733 Obstructive sleep apnea (adult) (pediatric): Secondary | ICD-10-CM

## 2023-07-28 MED ORDER — DILTIAZEM HCL ER 60 MG PO CP12: 60.0000 mg | ORAL_CAPSULE | Freq: Every day | ORAL | 1 refills | Status: DC

## 2023-07-28 NOTE — Telephone Encounter (Signed)
Called patient advised her that she was approved to do the sleep study no prior auth required. Reviewed instructions and gave pin number. Patient verbalized understanding.

## 2023-07-28 NOTE — Telephone Encounter (Signed)
Prior Authorization for [facility] sent to [facility] MEDICARE via web portal. Tracking Number . READY- APPROVED-NO PA REQ FOR [employer] MEDICARE

## 2023-07-28 NOTE — Patient Instructions (Addendum)
Medication Instructions:  Your physician has recommended you make the following change in your medication:  Start taking Diltiazem 60 mg only at night  Continue taking all other medications as prescribed  Labwork: None  Testing/Procedures: At home sleep study  Follow-Up: Your physician recommends that you schedule a follow-up appointment in: 6 months  Any Other Special Instructions Will Be Listed Below (If Applicable).  If you need a refill on your cardiac medications before your next appointment, please call your pharmacy.

## 2023-07-28 NOTE — Progress Notes (Signed)
Cardiology Office Note  Date: 07/28/2023   ID: Kristine Mata, DOB [birth year], MRN [id number]  PCP:  Rica Records, [name]  Cardiologist:  Marjo Bicker, MD Electrophysiologist:  None   Reason for Office Visit: f/u pSVT   History of Present Illness: Kristine Mata is a 67 y.o. female known to have HTN, HLD, paroxysmal SVT is here for follow-up visit.  Patient was referred to cardiology clinic in 2019 for evaluation of paroxysmal SVT. EKG showed NSR. She was already on metoprolol succinate at that time. Echocardiogram from 2019 showed normal LVEF and no valvular heart disease. PFO cannot be excluded. Repeat limited echocardiogram showed no PFO.  Event monitor from 04/2021 showed HR range 43-132 BPM with average HR 69 bpm, 1 run of NSVT lasting 5 beats, 2 runs of SVT, longest lasting 13 beats, second-degree heart block  (asymptomatic with no patient trigger), <1% PAC and <1% PVC burden.  Metoprolol was weaned off and switch to carvedilol for better HTN control.  Patient is here for follow-up visit, reported having worsening palpitations at night after stopping metoprolol and starting carvedilol.  Upon chart review, patient has had bad dreams all her life and I believe her palpitations likely secondary to bad dreams but patient reported that she has palpitations at night even when she did not have any bad dreams.  She was never diagnosed with OSA.  No angina, DOE, orthopnea, PND.  She sleeps with her mouth open.  No dizziness, presyncope and syncope.  Past Medical History:  Diagnosis Date   Ectopic pregnancy    Heart murmur    Hyperlipidemia    Hypertension    Insomnia, unspecified    Osteopenia 03/12/2021   Right knee pain    SVT (supraventricular tachycardia)     Past Surgical History:  Procedure Laterality Date   ABDOMINAL HYSTERECTOMY     BREAST CYST EXCISION Left    68 years old   COLONOSCOPY  2016   [facility]; 1 tubular adenoma, 1 hyperplastic polyp, TI  biopsy with reactive lymphoid hyperplasia without active or chronic enteritis.   COLONOSCOPY N/A 12/02/2021   Procedure: COLONOSCOPY;  Surgeon: Corbin Ade, MD;  [facility];  Service: Endoscopy;  Laterality: N/A;  7:30am   cyst left breast      SALPINGECTOMY Left    TMJ ARTHROPLASTY      Current Outpatient Medications  Medication Sig Dispense Refill   albuterol (VENTOLIN HFA) 108 (90 Base) MCG/ACT inhaler Inhale 2 puffs into the lungs every 6 (six) hours as needed for wheezing or shortness of breath. 8 g 2   carvedilol (COREG) 6.25 MG tablet Take 1 tablet (6.25 mg total) by mouth 2 (two) times daily. Start AFTER completing metoprolol for 3 weeks (Patient not taking: Reported on 06/23/2023) 180 tablet 3   fluticasone (FLONASE) 50 MCG/ACT nasal spray Place 2 sprays into both nostrils daily. 48 g 1   furosemide (LASIX) 80 MG tablet Take 1 tablet (80 mg total) by mouth daily. 90 tablet 1   loratadine (CLARITIN) 10 MG tablet Take 1 tablet (10 mg total) by mouth daily. 90 tablet 1   lovastatin (MEVACOR) 40 MG tablet Take 1 tablet (40 mg total) by mouth at bedtime. 100 tablet 0   Multiple Vitamins-Minerals (MULTIVITAMIN WITH MINERALS) tablet Take 1 tablet by mouth every other day.     traZODone (DESYREL) 50 MG tablet Take 0.5-1 tablets (25-50 mg total) by mouth at bedtime as needed for sleep. 30  tablet 2   No current facility-administered medications for this visit.   Allergies:  Anesthesia s-i-40 [propofol], Codeine, Olmesartan, Naproxen, Norvasc [amlodipine], and Tamiflu [oseltamivir]   Social History: The patient  reports that she has never smoked. She has never been exposed to tobacco smoke. She has never used smokeless tobacco. She reports that she does not drink alcohol and does not use drugs.   Family History: The patient's family history includes COPD in her brother and brother; Colon cancer in her sister and sister; Diabetes in her brother, mother, and sister; Heart disease  in her brother and mother; Hypertension in her brother, brother, father, mother, sister, and sister; Lung disease in her brother; Pancreatic cancer in her mother; Stroke in her brother and maternal grandmother; Thyroid disease in her brother and mother.   ROS:  Please see the history of present illness. Otherwise, complete review of systems is positive for none  All other systems are reviewed and negative.   Physical Exam: VS:  There were no vitals taken for this visit., BMI There is no height or weight on file to calculate BMI.  Wt Readings from Last 3 Encounters:  06/14/23 (!) 312 lb (141.5 kg)  05/26/23 (!) 312 lb (141.5 kg)  05/13/23 (!) 310 lb 0.6 oz (140.6 kg)    General: Patient appears comfortable at rest. HEENT: Conjunctiva and lids normal, oropharynx clear with moist mucosa. Neck: Supple, no elevated JVP or carotid bruits, no thyromegaly. Lungs: Clear to auscultation, nonlabored breathing at rest. Cardiac: Regular rate and rhythm, no S3 or significant systolic murmur, no pericardial rub. Abdomen: Soft, nontender, no hepatomegaly, bowel sounds present, no guarding or rebound. Extremities: No pitting edema, distal pulses 2+. Skin: Warm and dry. Musculoskeletal: No kyphosis. Neuropsychiatric: Alert and oriented x3, affect grossly appropriate.  Recent Labwork: 12/23/2022: ALT 15; AST 21 03/21/2023: TSH 0.50 05/20/2023: Hemoglobin 12.2; Platelets 296 06/20/2023: BUN 14; Creatinine, Ser 0.69; Magnesium 2.5; Potassium 4.1; Sodium 141     Component Value Date/Time   CHOL 179 05/20/2023 1013   TRIG 78 05/20/2023 1013   HDL 70 05/20/2023 1013   CHOLHDL 2.6 05/20/2023 1013   LDLCALC 95 05/20/2023 1013     Assessment and Plan:  # Paroxysmal SVT # Palpitations -Patient was initially referred to cardiology clinic in 2019 for evaluation of palpitations at night. EKG showed NSR and no evidence of arrhythmias. Event monitor from 04/2021 showed HR range 43-132 BPM with average HR 69 bpm, 1  run of NSVT lasting 5 beats, 2 runs of SVT, longest lasting 13 beats, second-degree heart block with 2.8-second pause (asymptomatic with no patient trigger), <1% PAC and <1% PVC burden. Upon chart review, her palpitations occurred only at night and believe these could be from the bad dreams.  Continue carvedilol 6.5 mg twice daily and add diltiazem 60 mg at night.  I strongly recommended 2-week event monitor to rule out any arrhythmias but patient wants to defer at this time.  Offered her sleep study to rule out sleep apnea which might be contributing to possible arrhythmias.  # HTN, controlled -Did not tolerate amlodipine due to leg swelling.  Metoprolol switched to carvedilol for better HTN control.  Continue p.o. Lasix 80 mg once daily.  # HLD, at goal -Continue lovastatin 40 mg nightly, goal LDL less than 100, follows with PCP.  # Morbid obesity with BMI more than 40 -Diet and exercise counseling provided.     Disposition:  Follow up  6 months  Signed Rashawn Rayman Priya Kelso Bibby,  MD, 07/28/2023 10:12 AM    [facility] [address] [place], [place], [place] [address]

## 2023-07-28 NOTE — Telephone Encounter (Signed)
Please send for Pre-Cert

## 2023-07-29 ENCOUNTER — Ambulatory Visit: Payer: Medicare HMO | Admitting: Internal Medicine

## 2023-07-29 ENCOUNTER — Encounter (INDEPENDENT_AMBULATORY_CARE_PROVIDER_SITE_OTHER): Payer: Medicare HMO | Admitting: Cardiology

## 2023-07-29 DIAGNOSIS — G4733 Obstructive sleep apnea (adult) (pediatric): Secondary | ICD-10-CM

## 2023-07-29 NOTE — Telephone Encounter (Signed)
Pt called in stating she set up itamar but it says device isn't registered, please advise.

## 2023-07-29 NOTE — Telephone Encounter (Signed)
Spoke with patient to advise her device has been registered can proceed with test tonight. Patient verbalized understanding

## 2023-08-08 ENCOUNTER — Ambulatory Visit: Payer: Medicare HMO | Attending: Internal Medicine

## 2023-08-08 DIAGNOSIS — G4733 Obstructive sleep apnea (adult) (pediatric): Secondary | ICD-10-CM

## 2023-08-08 NOTE — Procedures (Signed)
SLEEP STUDY REPORT Patient Information Study Date: 07/29/2023 Patient Name: Kristine Mata Patient ID: [id number] Birth Date: October 24, 1956 Age: 67 Gender: Female BMI: 56.8 (W=308 lb, H=5' 2'') Referring Physician: Luane School, MD  TEST DESCRIPTION: Home sleep apnea testing was completed using the WatchPat, a Type 1 device, utilizing  peripheral arterial tonometry (PAT), chest movement, actigraphy, pulse oximetry, pulse rate, body position and snore.  AHI was calculated with apnea and hypopnea using valid sleep time as the denominator. RDI includes apneas,  hypopneas, and RERAs. The data acquired and the scoring of sleep and all associated events were performed in  accordance with the recommended standards and specifications as outlined in the AASM Manual for the Scoring of  Sleep and Associated Events 2.2.0 (2015).  FINDINGS:  1. Moderate Obstructive Sleep Apnea with AHI 14.8/hr overall but severe during REM sleep with REM sleep AHI 30/hr.   2. No Central Sleep Apnea with pAHIc 0/hr.  3. Oxygen desaturations as low as 78%.  4. Mild to moderate snoring was present. O2 sats were < 88% for 5.7 min.  5. Total sleep time was 5 hrs and 53 min.  6. 24.1% of total sleep time was spent in REM sleep.   7. Normal sleep onset latency at 19 min  8. Prolonged REM sleep onset latency at 119 min.   9. Total awakenings were 10.  10. Arrhythmia detection: Suggestive of possible brief atrial fibrillation lasting 20 seconds. This is not diagnostic and  further testing with outpatient telemetry monitoring is recommended.  DIAGNOSIS:  Moderate Obstructive Sleep Apnea (G47.33) Nocturnal Hypoxemia Possible Atrial Fibrillation  RECOMMENDATIONS: 1. Clinical correlation of these findings is necessary. The decision to treat obstructive sleep apnea (OSA) is usually  based on the presence of apnea symptoms or the presence of associated medical conditions such as Hypertension,  Congestive Heart  Failure, Atrial Fibrillation or Obesity. The most common symptoms of OSA are snoring, gasping for  breath while sleeping, daytime sleepiness and fatigue.   2. Initiating apnea therapy is recommended given the presence of symptoms and/or associated conditions.  Recommend proceeding with one of the following:   a. Auto-CPAP therapy with a pressure range of 5-20cm H2O.   b. An oral appliance (OA) that can be obtained from certain dentists with expertise in sleep medicine. These are  primarily of use in non-obese patients with mild and moderate disease.   c. An ENT consultation which may be useful to look for specific causes of obstruction and possible treatment  options.   d. If patient is intolerant to PAP therapy, consider referral to ENT for evaluation for hypoglossal nerve stimulator.   3. Close follow-up is necessary to ensure success with CPAP or oral appliance therapy for maximum benefit .  4. A follow-up oximetry study on CPAP is recommended to assess the adequacy of therapy and determine the need  for supplemental oxygen or the potential need for Bi-level therapy. An arterial blood gas to determine the adequacy of  baseline ventilation and oxygenation should also be considered.  5. Healthy sleep recommendations include: adequate nightly sleep (normal 7-9 hrs/night), avoidance of caffeine after  noon and alcohol near bedtime, and maintaining a sleep environment that is cool, dark and quiet.  6. Weight loss for overweight patients is recommended. Even modest amounts of weight loss can significantly  improve the severity of sleep apnea.  7. Snoring recommendations include: weight loss where appropriate, side sleeping, and avoidance of alcohol before  bed.  8. Operation of motor  vehicle should not be performed when sleepy.  Signature: Armanda Magic, MD; [facility]; Diplomat, American Board of Sleep  Medicine Electronically Signed: 08/08/2023 12:37:21 PM

## 2023-08-11 ENCOUNTER — Ambulatory Visit: Payer: Medicare HMO | Admitting: Family Medicine

## 2023-08-12 ENCOUNTER — Telehealth (INDEPENDENT_AMBULATORY_CARE_PROVIDER_SITE_OTHER): Payer: Medicare HMO | Admitting: Family Medicine

## 2023-08-12 VITALS — Ht 70.0 in | Wt 305.0 lb

## 2023-08-12 DIAGNOSIS — M79602 Pain in left arm: Secondary | ICD-10-CM | POA: Insufficient documentation

## 2023-08-12 DIAGNOSIS — M25512 Pain in left shoulder: Secondary | ICD-10-CM | POA: Insufficient documentation

## 2023-08-12 MED ORDER — PREDNISONE 20 MG PO TABS: 20.0000 mg | ORAL_TABLET | Freq: Two times a day (BID) | ORAL | 0 refills | Status: AC

## 2023-08-12 NOTE — Assessment & Plan Note (Signed)
Prednisone 20 mg twice daily for 5 days Explained to patient Non pharmacological interventions include the use of ice or heat, rest, recommend range of motion exercises, gentle stretching. Can take tylenol  for pain management.  Follow up for worsening or persistent symptoms. Patient verbalizes understanding regarding plan of care and all questions answered.

## 2023-08-12 NOTE — Progress Notes (Signed)
Virtual Visit via Video Note  I connected with Kristine Mata on 08/12/23 at  9:00 AM EDT by a video enabled telemedicine application and verified that I am speaking with the correct person using two identifiers.  Patient Location: Home Provider Location: Office/Clinic  I discussed the limitations, risks, security, and privacy concerns of performing an evaluation and management service by video and the availability of in person appointments. I also discussed with the patient that there may be a patient responsible charge related to this service. The patient expressed understanding and agreed to proceed.  Subjective: PCP: Rica Records, [name]  Chief Complaint  Patient presents with   Arm Pain     Left arm with tingling   Kristine Mata 67 year old female, present via telehealth for left shoulder pain started a few weeks ago.   Shoulder Pain  The pain is present in the left shoulder and left arm. This is a recurrent problem. There has been a history of trauma. The problem occurs intermittently. The problem has been waxing and waning. The quality of the pain is described as dull. The pain is at a severity of 7/10. The pain is moderate. Associated symptoms include numbness, stiffness and tingling. Pertinent negatives include no joint locking, joint swelling or limited range of motion. The symptoms are aggravated by activity. She has tried acetaminophen for the symptoms. The treatment provided mild relief.     ROS: Per HPI  Current Outpatient Medications:    albuterol (VENTOLIN HFA) 108 (90 Base) MCG/ACT inhaler, Inhale 2 puffs into the lungs every 6 (six) hours as needed for wheezing or shortness of breath., Disp: 8 g, Rfl: 2   carvedilol (COREG) 6.25 MG tablet, Take 1 tablet (6.25 mg total) by mouth 2 (two) times daily. Start AFTER completing metoprolol for 3 weeks, Disp: 180 tablet, Rfl: 3   diltiazem (CARDIZEM SR) 60 MG 12 hr capsule, Take 1 capsule (60 mg total) by  mouth at bedtime., Disp: 90 capsule, Rfl: 1   fluticasone (FLONASE) 50 MCG/ACT nasal spray, Place 2 sprays into both nostrils daily., Disp: 48 g, Rfl: 1   furosemide (LASIX) 80 MG tablet, Take 1 tablet (80 mg total) by mouth daily., Disp: 90 tablet, Rfl: 1   loratadine (CLARITIN) 10 MG tablet, Take 1 tablet (10 mg total) by mouth daily., Disp: 90 tablet, Rfl: 1   lovastatin (MEVACOR) 40 MG tablet, Take 1 tablet (40 mg total) by mouth at bedtime., Disp: 100 tablet, Rfl: 0   Multiple Vitamins-Minerals (MULTIVITAMIN WITH MINERALS) tablet, Take 1 tablet by mouth every other day., Disp: , Rfl:    predniSONE (DELTASONE) 20 MG tablet, Take 1 tablet (20 mg total) by mouth 2 (two) times daily with a meal for 5 days., Disp: 10 tablet, Rfl: 0   traZODone (DESYREL) 50 MG tablet, Take 0.5-1 tablets (25-50 mg total) by mouth at bedtime as needed for sleep., Disp: 30 tablet, Rfl: 2  Observations/Objective: Today's Vitals   08/12/23 0924  Weight: (!) 305 lb (138.3 kg)  Height: 5\' 10"  (1.778 m)   Physical Exam Patient is alert and no acute distress noted.   Assessment and Plan: Acute pain of left shoulder Assessment & Plan: Prednisone 20 mg twice daily for 5 days Explained to patient Non pharmacological interventions include the use of ice or heat, rest, recommend range of motion exercises, gentle stretching. Can take tylenol  for pain management.  Follow up for worsening or persistent symptoms. Patient verbalizes understanding regarding  plan of care and all questions answered.    Other orders -     predniSONE; Take 1 tablet (20 mg total) by mouth 2 (two) times daily with a meal for 5 days.  Dispense: 10 tablet; Refill: 0    Follow Up Instructions: No follow-ups on file.   I discussed the assessment and treatment plan with the patient. The patient was provided an opportunity to ask questions, and all were answered. The patient agreed with the plan and demonstrated an understanding of the  instructions.   The patient was advised to call back or seek an in-person evaluation if the symptoms worsen or if the condition fails to improve as anticipated.  The above assessment and management plan was discussed with the patient. The patient verbalized understanding of and has agreed to the management plan.   Cruzita Lederer Newman Nip, [name]

## 2023-08-15 ENCOUNTER — Telehealth: Payer: Self-pay | Admitting: Internal Medicine

## 2023-08-15 NOTE — Telephone Encounter (Signed)
Pt c/o medication issue:  1. Name of Medication: diltiazem (CARDIZEM SR) 60 MG 12 hr capsule   2. How are you currently taking this medication (dosage and times per day)? Take 1 capsule (60 mg total) by mouth at bedtime.   3. Are you having a reaction (difficulty breathing--STAT)? No   4. What is your medication issue? Patient said that every time she takes medication, it gives her a sore throat and heart palpitations

## 2023-08-16 NOTE — Telephone Encounter (Signed)
Patient verbalized understanding and Is stopping Diltiazem and will be put on allergy list patient will call after a few days on update

## 2023-08-16 NOTE — Telephone Encounter (Signed)
Patient states when she takes this medication it feels like her throat is closing and since starting this medication she has had a rash all over her body that itches she also states that after taking the medication at night she has a severe headache.  Please advise patient is concerned

## 2023-08-16 NOTE — Telephone Encounter (Signed)
Patient is calling back regarding to this due to not hearing back yet.   She also added that she has a rash on her stomach that itches since starting the medication that is just now starting to go away.   Patient is also wanting to check on the status of sleep study results.   Please advise.

## 2023-08-16 NOTE — Addendum Note (Signed)
Addended by: Sharen Hones on: 08/16/2023 03:45 PM   Modules accepted: Orders

## 2023-08-17 ENCOUNTER — Ambulatory Visit: Payer: Medicare HMO | Admitting: Family Medicine

## 2023-08-26 ENCOUNTER — Ambulatory Visit: Payer: Medicare HMO | Admitting: Family Medicine

## 2023-09-02 ENCOUNTER — Telehealth: Payer: Self-pay

## 2023-09-02 DIAGNOSIS — G4733 Obstructive sleep apnea (adult) (pediatric): Secondary | ICD-10-CM

## 2023-09-02 NOTE — Telephone Encounter (Signed)
Notified patient of sleep study results and recommendations. All questions were answered and patient verbalized understanding. CPAP order placed through [facility] 09/02/23

## 2023-09-02 NOTE — Telephone Encounter (Signed)
-----   Message from Armanda Magic sent at 08/08/2023 12:38 PM EDT ----- Please let patient know that they have sleep apnea.  Recommend therapeutic CPAP titration for treatment of patient's sleep disordered breathing.  If unable to perform an in lab titration then initiate ResMed auto CPAP from 4 to 15cm H2O with heated humidity and mask of choice and overnight pulse ox on CPAP.

## 2023-09-08 ENCOUNTER — Encounter: Payer: Self-pay | Admitting: Internal Medicine

## 2023-09-08 ENCOUNTER — Ambulatory Visit: Payer: Medicare HMO | Attending: Internal Medicine | Admitting: Internal Medicine

## 2023-09-08 VITALS — BP 128/86 | HR 77 | Ht 70.0 in | Wt 308.2 lb

## 2023-09-08 DIAGNOSIS — G4733 Obstructive sleep apnea (adult) (pediatric): Secondary | ICD-10-CM | POA: Diagnosis not present

## 2023-09-08 NOTE — Patient Instructions (Signed)

## 2023-09-08 NOTE — Progress Notes (Signed)
Cardiology Office Note  Date: 09/08/2023   ID: [name], [name] 04-07-1956, MRN [id number]  PCP:  Rica Records, [name]  Cardiologist:  Marjo Bicker, MD Electrophysiologist:  None    History of Present Illness: Kristine Mata is a 67 y.o. female known to have HTN, HLD, paroxysmal SVT is here for follow-up visit.  Patient was referred to cardiology clinic in 2019 for evaluation of paroxysmal SVT. EKG showed NSR. She was already on metoprolol succinate at that time. Echocardiogram from 2019 showed normal LVEF and no valvular heart disease. PFO cannot be excluded. Repeat limited echocardiogram showed no PFO.  Event monitor from 04/2021 showed HR range 43-132 BPM with average HR 69 bpm, 1 run of NSVT lasting 5 beats, 2 runs of SVT, longest lasting 13 beats, second-degree heart block  (asymptomatic with no patient trigger), <1% PAC and <1% PVC burden.  Metoprolol was weaned off and switch to carvedilol for better HTN control.  Patient is here for follow-up visit, reported improved palpitations after starting carvedilol.  She was diagnosed with moderate OSA and awaiting CPAP therapy.  She was having left arm shooting pain and was wondering if this is related to her heart.  No chest pains reported.  No DOE, orthopnea, PND, syncope, presyncope or leg swelling.  Past Medical History:  Diagnosis Date   Ectopic pregnancy    Heart murmur    Hyperlipidemia    Hypertension    Insomnia, unspecified    Osteopenia 03/12/2021   Right knee pain    SVT (supraventricular tachycardia) (HCC)     Past Surgical History:  Procedure Laterality Date   ABDOMINAL HYSTERECTOMY     BREAST CYST EXCISION Left    67 years old   COLONOSCOPY  2016   [facility]; 1 tubular adenoma, 1 hyperplastic polyp, TI biopsy with reactive lymphoid hyperplasia without active or chronic enteritis.   COLONOSCOPY N/A 12/02/2021   Procedure: COLONOSCOPY;  Surgeon: Corbin Ade, MD;  Location: [facility];   Service: Endoscopy;  Laterality: N/A;  7:30am   cyst left breast      SALPINGECTOMY Left    TMJ ARTHROPLASTY      Current Outpatient Medications  Medication Sig Dispense Refill   albuterol (VENTOLIN HFA) 108 (90 Base) MCG/ACT inhaler Inhale 2 puffs into the lungs every 6 (six) hours as needed for wheezing or shortness of breath. 8 g 2   carvedilol (COREG) 6.25 MG tablet Take 1 tablet (6.25 mg total) by mouth 2 (two) times daily. Start AFTER completing metoprolol for 3 weeks 180 tablet 3   fluticasone (FLONASE) 50 MCG/ACT nasal spray Place 2 sprays into both nostrils daily. 48 g 1   furosemide (LASIX) 80 MG tablet Take 1 tablet (80 mg total) by mouth daily. 90 tablet 1   loratadine (CLARITIN) 10 MG tablet Take 1 tablet (10 mg total) by mouth daily. 90 tablet 1   lovastatin (MEVACOR) 40 MG tablet Take 1 tablet (40 mg total) by mouth at bedtime. 100 tablet 0   Multiple Vitamins-Minerals (MULTIVITAMIN WITH MINERALS) tablet Take 1 tablet by mouth every other day.     traZODone (DESYREL) 50 MG tablet Take 0.5-1 tablets (25-50 mg total) by mouth at bedtime as needed for sleep. 30 tablet 2   No current facility-administered medications for this visit.   Allergies:  Diltiazem, Anesthesia s-i-40 [propofol], Codeine, Olmesartan, Naproxen, Norvasc [amlodipine], and Tamiflu [oseltamivir]   Social History: The patient  reports that she has never smoked.  She has never been exposed to tobacco smoke. She has never used smokeless tobacco. She reports that she does not drink alcohol and does not use drugs.   Family History: The patient's family history includes COPD in her brother and brother; Colon cancer in her sister and sister; Diabetes in her brother, mother, and sister; Heart disease in her brother and mother; Hypertension in her brother, brother, father, mother, sister, and sister; Lung disease in her brother; Pancreatic cancer in her mother; Stroke in her brother and maternal grandmother; Thyroid disease  in her brother and mother.   ROS:  Please see the history of present illness. Otherwise, complete review of systems is positive for none  All other systems are reviewed and negative.   Physical Exam: VS:  BP 128/86   Pulse 77   Ht 5\' 10"  (1.778 m)   Wt (!) 308 lb 3.2 oz (139.8 kg)   SpO2 96%   BMI 44.22 kg/m , BMI Body mass index is 44.22 kg/m.  Wt Readings from Last 3 Encounters:  09/08/23 (!) 308 lb 3.2 oz (139.8 kg)  08/12/23 (!) 305 lb (138.3 kg)  07/28/23 (!) 308 lb 3.2 oz (139.8 kg)    General: Patient appears comfortable at rest. HEENT: Conjunctiva and lids normal, oropharynx clear with moist mucosa. Neck: Supple, no elevated JVP or carotid bruits, no thyromegaly. Lungs: Clear to auscultation, nonlabored breathing at rest. Cardiac: Regular rate and rhythm, no S3 or significant systolic murmur, no pericardial rub. Abdomen: Soft, nontender, no hepatomegaly, bowel sounds present, no guarding or rebound. Extremities: No pitting edema, distal pulses 2+. Skin: Warm and dry. Musculoskeletal: No kyphosis. Neuropsychiatric: Alert and oriented x3, affect grossly appropriate.  Recent Labwork: 12/23/2022: ALT 15; AST 21 03/21/2023: TSH 0.50 05/20/2023: Hemoglobin 12.2; Platelets 296 06/20/2023: BUN 14; Creatinine, Ser 0.69; Magnesium 2.5; Potassium 4.1; Sodium 141     Component Value Date/Time   CHOL 179 05/20/2023 1013   TRIG 78 05/20/2023 1013   HDL 70 05/20/2023 1013   CHOLHDL 2.6 05/20/2023 1013   LDLCALC 95 05/20/2023 1013     Assessment and Plan:  # Left arm pain, noncardiac -Patient has shooting pain in her left arm, no chest pain.  Noncardiac.  No further cardiac workup is indicated for this.  # Paroxysmal SVT # Palpitations -Ongoing palpitations at night since 2019, event monitor is unremarkable except for 1 run of NSVT, 2 runs of brief SVT lasting 13 beats, second-degree heart block with 2.8-second pause asymptomatic.  No PAC or PVC burden.  She also has bad dreams  waking her up from sleep with palpitations.  Currently on carvedilol 6.25 mg twice daily and reported improvement in her symptoms.  Diltiazem was added but she had rash and hence had to be discontinued.  She was also diagnosed with OSA recently and awaiting CPAP.  This should improve her palpitations.  # Moderate OSA -Diagnosed with moderate OSA recently, awaiting CPAP titration.  # HTN, controlled -Did not tolerate amlodipine due to leg swelling.  Had rash to diltiazem.  Metoprolol was switched to carvedilol for better HTN control, continue carvedilol 6.5 mg twice daily, continue p.o. Lasix 80 mg once daily.  # HLD, at goal -Continue lovastatin 40 mg nightly, goal LDL less than 100, follows with PCP.  # Morbid obesity with BMI more than 40 -Diet and exercise counseling provided.    I spent a total duration 30 minutes during the prior notes, labs, EKG, face-to-face discussion/counseling of her medical condition, pathophysiology, evaluation, management,  documenting the findings in the note.   Disposition:  Follow up  6 months  Signed Baljit Liebert Verne Spurr, MD, 09/08/2023 8:53 AM    [facility] at [facility] [address] [place], [place], [place] [address]

## 2023-09-11 NOTE — Patient Instructions (Signed)

## 2023-09-11 NOTE — Progress Notes (Unsigned)
   Established Patient Office Visit   Subjective  Patient ID: Kristine Mata, female    DOB: 01/24/1956  Age: 67 y.o. MRN: [id number]  No chief complaint on file.   She  has a past medical history of Ectopic pregnancy, Heart murmur, Hyperlipidemia, Hypertension, Insomnia, unspecified, Osteopenia (03/12/2021), Right knee pain, and SVT (supraventricular tachycardia) (HCC).  HPI Patient presents to the clinic for   ROS    Objective:     There were no vitals taken for this visit. {Vitals History (Optional):23777}  Physical Exam   No results found for any visits on 09/12/23.  The 10-year ASCVD risk score (Arnett DK, et al., 2019) is: 9.7%    Assessment & Plan:  Prediabetes  Primary hypertension    No follow-ups on file.   Cruzita Lederer Newman Nip, [name]

## 2023-09-12 ENCOUNTER — Ambulatory Visit (INDEPENDENT_AMBULATORY_CARE_PROVIDER_SITE_OTHER): Payer: Medicare HMO | Admitting: Family Medicine

## 2023-09-12 ENCOUNTER — Encounter: Payer: Self-pay | Admitting: Family Medicine

## 2023-09-12 VITALS — BP 134/83 | HR 67 | Ht 70.0 in | Wt 308.1 lb

## 2023-09-12 DIAGNOSIS — I10 Essential (primary) hypertension: Secondary | ICD-10-CM | POA: Diagnosis not present

## 2023-09-12 DIAGNOSIS — M25561 Pain in right knee: Secondary | ICD-10-CM

## 2023-09-12 DIAGNOSIS — R7303 Prediabetes: Secondary | ICD-10-CM | POA: Diagnosis not present

## 2023-09-12 DIAGNOSIS — G8929 Other chronic pain: Secondary | ICD-10-CM | POA: Diagnosis not present

## 2023-09-12 DIAGNOSIS — Z5982 Transportation insecurity: Secondary | ICD-10-CM

## 2023-09-12 MED ORDER — TIZANIDINE HCL 4 MG PO TABS: 4.0000 mg | ORAL_TABLET | Freq: Three times a day (TID) | ORAL | 1 refills | Status: DC | PRN

## 2023-09-12 MED ORDER — PREDNISONE 20 MG PO TABS
20.0000 mg | ORAL_TABLET | Freq: Two times a day (BID) | ORAL | 0 refills | Status: AC
Start: 2023-09-12 — End: 2023-09-17

## 2023-09-12 NOTE — Assessment & Plan Note (Signed)
Last Hemoglobin A1c 6.2 Repeat panel today Discussed follow a DASH diet which includes vegetables,fruits,whole grains, fat free or low fat diary,fish,poultry,beans,nuts and seeds,vegetable oils. Find an activity that you will enjoy and start to be active at least 5 days a week for 30 minutes each day.

## 2023-09-12 NOTE — Assessment & Plan Note (Addendum)
Prednisone 20 mg twice daily x 5 days Zanaflex 4 mg PRN I explained to the patient that non-pharmacological interventions include the application of ice or heat, rest, and recommended range of motion exercises along with gentle stretching. For pain management, Tylenol was advised. The patient was instructed to follow up if symptoms worsen or persist. The patient verbalized understanding of the care plan, and all questions were answered.

## 2023-09-12 NOTE — Assessment & Plan Note (Signed)
Patient interested in Medical weight loss clinic in [place] but has transportation issues Referral placed to Social services   Continued discussion adhering to weight loss plan, with strong emphasize on nutrition and exercise. Read food labels to know how many calories are in each serving , Include more protein intake such as lean meat, poultry, fish. Increase fiber intake such as vegetables, whole grains, fruits, artichokes, green peas, broccoli, lentils and lima beans.

## 2023-09-14 LAB — CBC WITH DIFFERENTIAL/PLATELET
Basophils Absolute: 0 10*3/uL (ref 0.0–0.2)
Basos: 0 %
EOS (ABSOLUTE): 0.1 10*3/uL (ref 0.0–0.4)
Eos: 2 %
Hematocrit: 38.4 % (ref 34.0–46.6)
Hemoglobin: 11.8 g/dL (ref 11.1–15.9)
Immature Grans (Abs): 0 10*3/uL (ref 0.0–0.1)
Immature Granulocytes: 0 %
Lymphocytes Absolute: 2 10*3/uL (ref 0.7–3.1)
Lymphs: 41 %
MCH: 27.8 pg (ref 26.6–33.0)
MCHC: 30.7 g/dL — ABNORMAL LOW (ref 31.5–35.7)
MCV: 91 fL (ref 79–97)
Monocytes Absolute: 0.4 10*3/uL (ref 0.1–0.9)
Monocytes: 8 %
Neutrophils Absolute: 2.4 10*3/uL (ref 1.4–7.0)
Neutrophils: 49 %
Platelets: 303 10*3/uL (ref 150–450)
RBC: 4.24 x10E6/uL (ref 3.77–5.28)
RDW: 13.9 % (ref 11.7–15.4)
WBC: 5 10*3/uL (ref 3.4–10.8)

## 2023-09-14 LAB — BMP8+EGFR
BUN/Creatinine Ratio: 16 (ref 12–28)
BUN: 9 mg/dL (ref 8–27)
CO2: 22 mmol/L (ref 20–29)
Calcium: 10.6 mg/dL — ABNORMAL HIGH (ref 8.7–10.3)
Chloride: 102 mmol/L (ref 96–106)
Creatinine, Ser: 0.57 mg/dL (ref 0.57–1.00)
Glucose: 113 mg/dL — ABNORMAL HIGH (ref 70–99)
Potassium: 3.8 mmol/L (ref 3.5–5.2)
Sodium: 144 mmol/L (ref 134–144)
eGFR: 99 mL/min/{1.73_m2} (ref >59)

## 2023-09-14 LAB — LIPID PANEL
Chol/HDL Ratio: 2.4 ratio (ref 0.0–4.4)
Cholesterol, Total: 181 mg/dL (ref 100–199)
HDL: 75 mg/dL (ref >39)
LDL Chol Calc (NIH): 89 mg/dL (ref 0–99)
Triglycerides: 93 mg/dL (ref 0–149)
VLDL Cholesterol Cal: 17 mg/dL (ref 5–40)

## 2023-09-14 LAB — HEMOGLOBIN A1C
Est. average glucose Bld gHb Est-mCnc: 131 mg/dL
Hgb A1c MFr Bld: 6.2 % — ABNORMAL HIGH (ref 4.8–5.6)

## 2023-09-14 NOTE — Progress Notes (Signed)
Please inform patient,   Hemoglobin A1c 6.2 indicates prediabetes - no medication intervention just lifestyle changes    I can prescribe Metformin for the patient to support their weight loss goals, if they are interested. Please let me know their preference.    A Sample Day of Eating for or Prediabetes:  Breakfast: Oatmeal with a handful of berries and a sprinkle of chia seeds, paired with a boiled egg.  Lunch: Grilled chicken breast on a bed of spinach and kale, topped with avocado, a drizzle of olive oil, and a slice of whole grain bread.  Snack: A handful of almonds or a small apple with peanut butter.  Dinner: Baked salmon with roasted broccoli and quinoa.  Snack (if needed): A piece of cheese or a small bowl of Greek yogurt.  Find an activity that you will enjoyandstart to be active at least 5 days a week for 30 minutes each day.

## 2023-09-16 ENCOUNTER — Ambulatory Visit
Admission: RE | Admit: 2023-09-16 | Discharge: 2023-09-16 | Disposition: A | Discharge status: Home / Self Care | Payer: Medicare HMO | Source: Ambulatory Visit | Attending: Family Medicine | Admitting: Family Medicine

## 2023-09-16 ENCOUNTER — Other Ambulatory Visit: Payer: Self-pay | Admitting: Family Medicine

## 2023-09-16 DIAGNOSIS — Z1231 Encounter for screening mammogram for malignant neoplasm of breast: Secondary | ICD-10-CM

## 2023-09-16 DIAGNOSIS — Z0000 Encounter for general adult medical examination without abnormal findings: Secondary | ICD-10-CM

## 2023-09-19 ENCOUNTER — Telehealth: Payer: Self-pay | Admitting: Family Medicine

## 2023-09-19 ENCOUNTER — Telehealth: Payer: Self-pay

## 2023-09-19 ENCOUNTER — Telehealth: Payer: Self-pay | Admitting: *Deleted

## 2023-09-19 ENCOUNTER — Telehealth: Payer: Self-pay | Admitting: Internal Medicine

## 2023-09-19 NOTE — Telephone Encounter (Signed)
Please ask patient to send a message to her cardiologist for any medication changes due to her blood pressure reading

## 2023-09-19 NOTE — Telephone Encounter (Signed)
Patient called 145/78 pulse 88 and oxgyen 96, she asking to speak to nurse everynight goes to bed rapid heart beat, she asking speak to nurse. She knows this is not normal.   Teams message sent out 11.04.2024 at 1:14pm

## 2023-09-19 NOTE — Progress Notes (Signed)
  Care Coordination   Note   09/19/2023 Name: [name] MRN: [id number] DOB: 22-May-1956  Kristine Mata is a 67 y.o. year old female who sees Del Newman Nip, Tenna Child, [name] for primary care. I reached out to Geoffry Paradise by phone today to offer care coordination services.  [name] was given information about Care Coordination services today including:   The Care Coordination services include support from the care team which includes your Nurse Coordinator, Clinical Social Worker, or Pharmacist.  The Care Coordination team is here to help remove barriers to the health concerns and goals most important to you. Care Coordination services are voluntary, and the patient may decline or stop services at any time by request to their care team member.   Care Coordination Consent Status: Patient agreed to services and verbal consent obtained.   Follow up plan:  Telephone appointment with care coordination team member scheduled for:  09/20/23  Encounter Outcome:  Patient Scheduled  [facility] Coordination Care Guide  Direct Dial: [phone]

## 2023-09-19 NOTE — Telephone Encounter (Signed)
Patient c/o Palpitations:  High priority if patient c/o lightheadedness, shortness of breath, or chest pain  How long have you had palpitations/irregular HR/ Afib? Are you having the symptoms now? Every night  Are you currently experiencing lightheadedness, SOB or CP? No   Do you have a history of afib (atrial fibrillation) or irregular heart rhythm? Yes  Have you checked your BP or HR? (document readings if available): 80's-90's Last night 148/78   The other night 150/78-80  Are you experiencing any other symptoms? Patient stated this will happen every night. Patient stated her heart rate will beat rapidly and her BP will go up. Patient denies having other symptoms when this occurs. Patient would like to know if she needs to switch any of her medications. Please advise.

## 2023-09-19 NOTE — Telephone Encounter (Signed)
Pt. Has been informed and will contact Cardiology.

## 2023-09-20 ENCOUNTER — Ambulatory Visit: Payer: Self-pay

## 2023-09-20 NOTE — Patient Instructions (Signed)
Visit Information  Thank you for taking time to visit with me today. Please don't hesitate to contact me if I can be of assistance to you.   Following are the goals we discussed today:  Patient will communicate with [employer] to enroll in transportation program.   Our next appointment is by telephone on 09/23/23 at 11:30am.  Please call the care guide team at [phone] if you need to cancel or reschedule your appointment.   If you are experiencing a Mental Health or Behavioral Health Crisis or need someone to talk to, please call 911  Patient verbalizes understanding of instructions and care plan provided today and agrees to view in MyChart. Active MyChart status and patient understanding of how to access instructions and care plan via MyChart confirmed with patient.     Telephone follow up appointment with care management team member scheduled for: 09/23/23 at 11:30am  Lysle Morales, BSW Social Worker [phone]

## 2023-09-20 NOTE — Telephone Encounter (Signed)
Reports palpitations continue throughout the night that wakes her up. Reports she's had these palpitations for awhile and they are no worse. Denies chest pain, dizziness or sob. Confirmed that she takes carvedilol 6.25 mg BID. Reports she does have caffeine containing drinks sometimes. Reports she uses decaf mostly. Reports she does have stress due to neighbors knocking on her door at night. Today BP was 138/78 & HR 90. Advised that message would be sent to provider and if she has worsening symptoms to go to the ED for an evaluation. Verbalized understanding of plan.

## 2023-09-20 NOTE — Patient Outreach (Signed)
  Care Coordination   Initial Visit Note   09/20/2023 Name: Kristine Mata MRN: [id number] DOB: 05-08-1956  Kristine Mata is a 67 y.o. year old female who sees Del Newman Nip, Tenna Child, [name] for primary care. I spoke with  Geoffry Paradise by phone today.  What matters to the patients health and wellness today?  Patient needs transportation resources.    Goals Addressed             This Visit's Progress    Transportation       Interventions Today    Flowsheet Row Most Recent Value  Chronic Disease   Chronic disease during today's visit Hypertension (HTN)  General Interventions   General Interventions Discussed/Reviewed General Interventions Discussed, General Interventions Reviewed, [employer], Communication with  [Pt car needs repair and family has been assisting with transportation.]  Communication with --  [SW t/c [employer] but transportation is not covered. SW t/c [employer] and left vm to contact pt to enroll.]              SDOH assessments and interventions completed:  Yes  SDOH Interventions Today    Flowsheet Row Most Recent Value  SDOH Interventions   Food Insecurity Interventions Intervention Not Indicated  Housing Interventions Intervention Not Indicated  Transportation Interventions Intervention Not Indicated, Other (Comment)  [Has car but needs repairs]  Utilities Interventions Intervention Not Indicated        Care Coordination Interventions:  Yes, provided   Follow up plan: Follow up call scheduled for 09/23/23 at 11:30am    Encounter Outcome:  Patient Visit Completed

## 2023-09-20 NOTE — Telephone Encounter (Signed)
Patient informed and agrees to wearing heart monitor. Aware that monitor will be mailed to home address.

## 2023-09-21 ENCOUNTER — Ambulatory Visit: Payer: Medicare HMO | Attending: Internal Medicine

## 2023-09-21 ENCOUNTER — Other Ambulatory Visit: Payer: Self-pay | Admitting: Internal Medicine

## 2023-09-21 DIAGNOSIS — R002 Palpitations: Secondary | ICD-10-CM

## 2023-09-21 NOTE — Telephone Encounter (Signed)
14 Day [employer] XT enrollment completed to be sent to home.

## 2023-09-22 ENCOUNTER — Telehealth: Payer: Self-pay | Admitting: Internal Medicine

## 2023-09-22 NOTE — Telephone Encounter (Signed)
Checking percert on the following  14 Day [employer] X[employer] enrollment completed to be sent to home.

## 2023-09-23 ENCOUNTER — Ambulatory Visit: Payer: Self-pay

## 2023-09-23 DIAGNOSIS — R400 Somnolence: Secondary | ICD-10-CM | POA: Diagnosis not present

## 2023-09-23 DIAGNOSIS — G4733 Obstructive sleep apnea (adult) (pediatric): Secondary | ICD-10-CM | POA: Diagnosis not present

## 2023-09-23 NOTE — Patient Instructions (Signed)
Visit Information  Thank you for taking time to visit with me today. Please don't hesitate to contact me if I can be of assistance to you.   Following are the goals we discussed today:  Patient will use [employer] for local transportation and will work with family for out of county transportation.   If you are experiencing a Mental Health or Behavioral Health Crisis or need someone to talk to, please call 911  Patient verbalizes understanding of instructions and care plan provided today and agrees to view in MyChart. Active MyChart status and patient understanding of how to access instructions and care plan via MyChart confirmed with patient.     No further follow up required: Patient does not request an additional follow up.  Lysle Morales, BSW Social Worker [phone]

## 2023-09-23 NOTE — Patient Outreach (Signed)
  Care Coordination   Follow Up Visit Note   09/23/2023 Name: ILAH LIEBELT MRN: [id number] DOB: 1956/06/11  VIOLET WHITINGER is a 67 y.o. year old female who sees Del Newman Nip, Tenna Child, [name] for primary care. I spoke with  Geoffry Paradise by phone today.  What matters to the patients health and wellness today?  Patient needs transportation.     Goals Addressed             This Visit's Progress    Transportation       Interventions Today    Flowsheet Row Most Recent Value  General Interventions   General Interventions Discussed/Reviewed General Interventions Discussed, General Interventions Reviewed  [Pt was approved for [employer] in county transportation but not out of county. Pt has already contacted private transportation companies and it is too expensive.  Pt has apt is [place] for a second opinion and other apts are local. Pt does have family who assist.]              SDOH assessments and interventions completed:  No     Care Coordination Interventions:  Yes, provided   Follow up plan: No further intervention required.   Encounter Outcome:  Patient Visit Completed

## 2023-09-25 DIAGNOSIS — R002 Palpitations: Secondary | ICD-10-CM

## 2023-09-29 ENCOUNTER — Other Ambulatory Visit: Payer: Self-pay | Admitting: Family

## 2023-09-29 DIAGNOSIS — J069 Acute upper respiratory infection, unspecified: Secondary | ICD-10-CM

## 2023-10-05 ENCOUNTER — Other Ambulatory Visit: Payer: Self-pay

## 2023-10-05 MED ORDER — FLUTICASONE PROPIONATE 50 MCG/ACT NA SUSP: 2.0000 | Freq: Every day | NASAL | 1 refills | Status: DC

## 2023-10-12 DIAGNOSIS — R002 Palpitations: Secondary | ICD-10-CM | POA: Diagnosis not present

## 2023-10-20 NOTE — Telephone Encounter (Signed)
Call came from [facility] Vonna Kotyk) that the ONO for this patient has been declined due to the patient states she does not have transportation to come pick it up.

## 2023-10-21 ENCOUNTER — Telehealth: Payer: Self-pay | Admitting: Internal Medicine

## 2023-10-21 NOTE — Telephone Encounter (Signed)
Pt is calling to get heart monitor results

## 2023-10-23 DIAGNOSIS — G4733 Obstructive sleep apnea (adult) (pediatric): Secondary | ICD-10-CM | POA: Diagnosis not present

## 2023-10-23 DIAGNOSIS — R400 Somnolence: Secondary | ICD-10-CM | POA: Diagnosis not present

## 2023-10-25 ENCOUNTER — Encounter: Payer: Self-pay | Admitting: *Deleted

## 2023-10-25 NOTE — Telephone Encounter (Signed)
Replied via mychart.

## 2023-10-25 NOTE — Telephone Encounter (Signed)
Notified via mychart.

## 2023-10-25 NOTE — Telephone Encounter (Signed)
Patient is following up regarding her request to discuss monitor results.

## 2023-10-26 ENCOUNTER — Other Ambulatory Visit: Payer: Self-pay | Admitting: Family Medicine

## 2023-10-26 DIAGNOSIS — J069 Acute upper respiratory infection, unspecified: Secondary | ICD-10-CM

## 2023-10-26 MED ORDER — LORATADINE 10 MG PO TABS: 10.0000 mg | ORAL_TABLET | Freq: Every day | ORAL | 1 refills | Status: DC

## 2023-10-26 NOTE — Telephone Encounter (Signed)
Copied from CRM [id number]. Topic: Clinical - Medication Refill >> Oct 26, 2023  3:26 PM Louie Casa B wrote: Most Recent Primary Care Visit:  Provider: Rica Records  Department: [facility]  Visit Type: OFFICE VISIT  Date: 09/12/2023  Medication: ***  Has the patient contacted their pharmacy?  (Agent: If no, request that the patient contact the pharmacy for the refill. If patient does not wish to contact the pharmacy document the reason why and proceed with request.) (Agent: If yes, when and what did the pharmacy advise?)  Is this the correct pharmacy for this prescription?  If no, delete pharmacy and type the correct one.  This is the patient's preferred pharmacy:  [facility] [address], [place] - [address] [place] [address] Phone: [phone] Fax: [phone]   Has the prescription been filled recently?   Is the patient out of the medication?   Has the patient been seen for an appointment in the last year OR does the patient have an upcoming appointment?   Can we respond through MyChart?   Agent: Please be advised that Rx refills may take up to 3 business days. We ask that you follow-up with your pharmacy.

## 2023-10-27 ENCOUNTER — Telehealth: Payer: Self-pay

## 2023-10-27 NOTE — Telephone Encounter (Signed)
Refills sent to pharmacy. 

## 2023-10-27 NOTE — Telephone Encounter (Signed)
Copied from CRM [id number]. Topic: Clinical - Medication Refill >> Oct 26, 2023  3:24 PM Louie Casa B wrote: Most Recent Primary Care Visit:  Provider: Rica Records  Department: [facility]  Visit Type: OFFICE VISIT  Date: 09/12/2023  Medication: Claritin 10 mg   Has the patient contacted their pharmacy? Yes (Agent: If no, request that the patient contact the pharmacy for the refill. If patient does not wish to contact the pharmacy document the reason why and proceed with request.) (Agent: If yes, when and what did the pharmacy advise?)  Is this the correct pharmacy for this prescription? Yes If no, delete pharmacy and type the correct one.  This is the patient's preferred pharmacy:  [facility] [address] [place] - [address] [place] [address] Phone: [phone] Fax: [phone]   Has the prescription been filled recently? No  Is the patient out of the medication? Yes  Has the patient been seen for an appointment in the last year OR does the patient have an upcoming appointment? Yes  Can we respond through MyChart? No  Agent: Please be advised that Rx refills may take up to 3 business days. We ask that you follow-up with your pharmacy.

## 2023-11-01 ENCOUNTER — Telehealth: Payer: Self-pay

## 2023-11-01 ENCOUNTER — Ambulatory Visit: Payer: Medicare HMO | Attending: Internal Medicine | Admitting: Internal Medicine

## 2023-11-01 DIAGNOSIS — I10 Essential (primary) hypertension: Secondary | ICD-10-CM

## 2023-11-01 DIAGNOSIS — I4719 Other supraventricular tachycardia: Secondary | ICD-10-CM

## 2023-11-01 MED ORDER — CARVEDILOL 12.5 MG PO TABS: 12.5000 mg | ORAL_TABLET | Freq: Two times a day (BID) | ORAL | 3 refills | Status: DC

## 2023-11-01 NOTE — Progress Notes (Signed)
Virtual Visit via Telephone Note   Because of Kristine Mata co-morbid illnesses, she is at least at moderate risk for complications without adequate follow up.  This format is felt to be most appropriate for this patient at this time.  The patient did not have access to video technology/had technical difficulties with video requiring transitioning to audio format only (telephone).  All issues noted in this document were discussed and addressed.  No physical exam could be performed with this format.  Please refer to the patient's chart for her consent to telehealth for [facility].    Date:  11/01/2023   ID:  Kristine Mata, DOB 11/05/1956, MRN [id number] The patient was identified using 2 identifiers.  Patient Location: Home Provider Location: Office/Clinic   PCP:  Rica Records, [name]   [facility] Providers Cardiologist:  Marjo Bicker, MD     Evaluation Performed:  Follow-Up Visit  Chief Complaint:  F/u  History of Present Illness:    Kristine Mata is a 67 y.o. female with HTN, HLD, paroxysmal SVT is here for follow-up visit.  Patient was referred to cardiology clinic in 2019 for evaluation of paroxysmal SVT. EKG showed NSR. She was already on metoprolol succinate at that time. Echocardiogram from 2019 showed normal LVEF and no valvular heart disease. PFO cannot be excluded. Repeat limited echocardiogram showed no PFO. Event monitor from 04/2021 showed HR range 43-132 BPM with average HR 69 bpm, 1 run of NSVT lasting 5 beats, 2 runs of SVT, longest lasting 13 beats, second-degree heart block (asymptomatic with no patient trigger), <1% PAC and <1% PVC burden. Metoprolol was weaned off and switched to carvedilol for better HTN control.   Patient is here to discuss event monitor results.  I reviewed the event monitor results with her.  She is worried her blood pressure is high at home, 140s millimeters mercury SBP and HR 76 today.  No  other symptoms of chest pain, SOB, dizziness, syncope, palpitations or leg swelling.  She got CPAP recently and noticed a lot of difference after started to sleep with CPAP.  She is also motivated lose weight.   Past Medical History:  Diagnosis Date   Ectopic pregnancy    Heart murmur    Hyperlipidemia    Hypertension    Insomnia, unspecified    Osteopenia 03/12/2021   Right knee pain    SVT (supraventricular tachycardia) (HCC)    Past Surgical History:  Procedure Laterality Date   ABDOMINAL HYSTERECTOMY     BREAST CYST EXCISION Left    67 years old   COLONOSCOPY  2016   [facility]; 1 tubular adenoma, 1 hyperplastic polyp, TI biopsy with reactive lymphoid hyperplasia without active or chronic enteritis.   COLONOSCOPY N/A 12/02/2021   Procedure: COLONOSCOPY;  Surgeon: Corbin Ade, MD;  Location: [facility];  Service: Endoscopy;  Laterality: N/A;  7:30am   cyst left breast      SALPINGECTOMY Left    TMJ ARTHROPLASTY       Current Meds  Medication Sig   albuterol (VENTOLIN HFA) 108 (90 Base) MCG/ACT inhaler Inhale 2 puffs into the lungs every 6 (six) hours as needed for wheezing or shortness of breath.   carvedilol (COREG) 12.5 MG tablet Take 1 tablet (12.5 mg total) by mouth 2 (two) times daily with a meal.   fluticasone (FLONASE) 50 MCG/ACT nasal spray Place 2 sprays into both nostrils daily.   furosemide (LASIX) 80 MG tablet  Take 1 tablet (80 mg total) by mouth daily.   loratadine (CLARITIN) 10 MG tablet Take 1 tablet (10 mg total) by mouth daily.   lovastatin (MEVACOR) 40 MG tablet Take 1 tablet (40 mg total) by mouth at bedtime.   Multiple Vitamins-Minerals (MULTIVITAMIN WITH MINERALS) tablet Take 1 tablet by mouth every other day.   tiZANidine (ZANAFLEX) 4 MG tablet Take 1 tablet (4 mg total) by mouth every 8 (eight) hours as needed for muscle spasms.   traZODone (DESYREL) 50 MG tablet Take 0.5-1 tablets (25-50 mg total) by mouth at bedtime as needed for sleep.    [DISCONTINUED] carvedilol (COREG) 6.25 MG tablet Take 1 tablet (6.25 mg total) by mouth 2 (two) times daily. Start AFTER completing metoprolol for 3 weeks     Allergies:   Diltiazem, Anesthesia s-i-40 [propofol], Codeine, Olmesartan, Naproxen, Norvasc [amlodipine], and Tamiflu [oseltamivir]   Social History   Tobacco Use   Smoking status: Never    Passive exposure: Never   Smokeless tobacco: Never  Vaping Use   Vaping status: Never Used  Substance Use Topics   Alcohol use: Never   Drug use: Never     Family Hx: The patient's family history includes COPD in her brother and brother; Colon cancer in her sister and sister; Diabetes in her brother, mother, and sister; Heart disease in her brother and mother; Hypertension in her brother, brother, father, mother, sister, and sister; Lung disease in her brother; Pancreatic cancer in her mother; Stroke in her brother and maternal grandmother; Thyroid disease in her brother and mother. There is no history of Breast cancer.  ROS:   Please see the history of present illness.     All other systems reviewed and are negative.   Prior CV studies:   The following studies were reviewed today:    Labs/Other Tests and Data Reviewed:    EKG:    Recent Labs: 12/23/2022: ALT 15 03/21/2023: TSH 0.50 06/20/2023: Magnesium 2.5 09/12/2023: BUN 9; Creatinine, Ser 0.57; Hemoglobin 11.8; Platelets 303; Potassium 3.8; Sodium 144   Recent Lipid Panel Lab Results  Component Value Date/Time   CHOL 181 09/12/2023 10:04 AM   TRIG 93 09/12/2023 10:04 AM   HDL 75 09/12/2023 10:04 AM   CHOLHDL 2.4 09/12/2023 10:04 AM   LDLCALC 89 09/12/2023 10:04 AM    Wt Readings from Last 3 Encounters:  09/12/23 (!) 308 lb 1.3 oz (139.7 kg)  09/08/23 (!) 308 lb 3.2 oz (139.8 kg)  08/12/23 (!) 305 lb (138.3 kg)     Risk Assessment/Calculations:     STOP-Bang Score:  3      Objective:    Vital Signs:  BP (!) 150/80      ASSESSMENT & PLAN:     Paroxysmal  SVT HTN, partially controlled Moderate OSA on CPAP HLD, at goal Morbid obesity with BMI more than 40   -No palpitations, I reviewed the event monitor results with the patient.  Home BP around 140 mmHg and HR 76 bpm today.  She is worried about her elevated blood pressures.  Currently on carvedilol 6.25 mg which I will increase to 12.5 mg twice daily.  She got CPAP recently and noticed symptoms getting better after starting CPAP.  She also is motivated to lose weight.    Time:   Today, I have spent 15 minutes with the patient with telehealth technology discussing the above problems.     Medication Adjustments/Labs and Tests Ordered: Current medicines are reviewed at length with the  patient today.  Concerns regarding medicines are outlined above.   Tests Ordered: No orders of the defined types were placed in this encounter.   Medication Changes: Meds ordered this encounter  Medications   carvedilol (COREG) 12.5 MG tablet    Sig: Take 1 tablet (12.5 mg total) by mouth 2 (two) times daily with a meal.    Dispense:  60 tablet    Refill:  3    11/01/2023-Dose increase    Follow Up:    March 2025  Signed, Orion Modest Shayona Hibbitts, MD  11/01/2023 11:59 AM    [facility]

## 2023-11-01 NOTE — Patient Instructions (Addendum)
Medication Instructions:  Your physician has recommended you make the following change in your medication:  Increased Carvedilol from 6.25 to 12.5 mg twice daily Continue taking all medications as prescribed  Labwork: None  Testing/Procedures: None  Follow-Up: Your physician recommends that you schedule a follow-up appointment in: Keep same appointment scheduled  Any Other Special Instructions Will Be Listed Below (If Applicable).  If you need a refill on your cardiac medications before your next appointment, please call your pharmacy.

## 2023-11-01 NOTE — Telephone Encounter (Signed)
  Patient Consent for Virtual Visit        Kristine Mata has provided verbal consent on 11/01/2023 for a virtual visit (video or telephone).   CONSENT FOR VIRTUAL VISIT FOR:  Kristine Mata  By participating in this virtual visit I agree to the following:  I hereby voluntarily request, consent and authorize [facility] and its employed or contracted physicians, physician assistants, nurse practitioners or other licensed health care professionals (the Practitioner), to provide me with telemedicine health care services (the "Services") as deemed necessary by the treating Practitioner. I acknowledge and consent to receive the Services by the Practitioner via telemedicine. I understand that the telemedicine visit will involve communicating with the Practitioner through live audiovisual communication technology and the disclosure of certain medical information by electronic transmission. I acknowledge that I have been given the opportunity to request an in-person assessment or other available alternative prior to the telemedicine visit and am voluntarily participating in the telemedicine visit.  I understand that I have the right to withhold or withdraw my consent to the use of telemedicine in the course of my care at any time, without affecting my right to future care or treatment, and that the Practitioner or I may terminate the telemedicine visit at any time. I understand that I have the right to inspect all information obtained and/or recorded in the course of the telemedicine visit and may receive copies of available information for a reasonable fee.  I understand that some of the potential risks of receiving the Services via telemedicine include:  Delay or interruption in medical evaluation due to technological equipment failure or disruption; Information transmitted may not be sufficient (e.g. poor resolution of images) to allow for appropriate medical decision making by the  Practitioner; and/or  In rare instances, security protocols could fail, causing a breach of personal health information.  Furthermore, I acknowledge that it is my responsibility to provide information about my medical history, conditions and care that is complete and accurate to the best of my ability. I acknowledge that Practitioner's advice, recommendations, and/or decision may be based on factors not within their control, such as incomplete or inaccurate data provided by me or distortions of diagnostic images or specimens that may result from electronic transmissions. I understand that the practice of medicine is not an exact science and that Practitioner makes no warranties or guarantees regarding treatment outcomes. I acknowledge that a copy of this consent can be made available to me via my patient portal [facility] MyChart), or I can request a printed copy by calling the office of  [facility].    I understand that my insurance will be billed for this visit.   I have read or had this consent read to me. I understand the contents of this consent, which adequately explains the benefits and risks of the Services being provided via telemedicine.  I have been provided ample opportunity to ask questions regarding this consent and the Services and have had my questions answered to my satisfaction. I give my informed consent for the services to be provided through the use of telemedicine in my medical care

## 2023-11-04 ENCOUNTER — Telehealth: Payer: Self-pay | Admitting: Internal Medicine

## 2023-11-04 NOTE — Telephone Encounter (Signed)
Reports she thinks carvedilol is causing chest tightness. Reports her carvedilol was increased to 12.5 mg twice daily 2-3 days ago. She reports chest tightness after taking the 2nd dose. Reports taking morning dose around 8:30 am and she doesn't feel chest tightness until she takes her second dose 8-9 hours after first dose. Reports she only feels chest tightness after her 2nd of carvedilol. Denies active chest tightness. Reports this morning her BP was 145/80 & HR 70. Advised to start taking carvedilol at least 12 hours apart and see if symptoms improve. Also advised that message would be sent to provider for recommendations. Verbalized understanding.

## 2023-11-04 NOTE — Telephone Encounter (Signed)
Pt c/o medication issue:  1. Name of Medication: Carvedilol 12.5mg  2 times a day  2. How are you currently taking this medication (dosage and times per day)?   3. Are you having a reaction (difficulty breathing--STAT)?  ,   4. What is your medication issue? Causing tightness in her chest- having some tightness at this time

## 2023-11-04 NOTE — Telephone Encounter (Signed)
Patient informed and verbalized understanding of plan. 

## 2023-11-23 DIAGNOSIS — R400 Somnolence: Secondary | ICD-10-CM | POA: Diagnosis not present

## 2023-11-23 DIAGNOSIS — G4733 Obstructive sleep apnea (adult) (pediatric): Secondary | ICD-10-CM | POA: Diagnosis not present

## 2023-12-05 ENCOUNTER — Other Ambulatory Visit: Payer: Self-pay | Admitting: Family Medicine

## 2023-12-05 DIAGNOSIS — I10 Essential (primary) hypertension: Secondary | ICD-10-CM

## 2023-12-11 ENCOUNTER — Encounter: Payer: Self-pay | Admitting: Cardiology

## 2023-12-11 NOTE — Progress Notes (Unsigned)
SLEEP MEDICINE VIRTUAL CONSULT NOTE Video Note   Because of Kristine Mata's co-morbid illnesses, she is at least at moderate risk for complications without adequate follow up.  This format is felt to be most appropriate for this patient at this time.  All issues noted in this document were discussed and addressed.  A limited physical exam was performed with this format.  Please refer to the patient's chart for her consent to telehealth for [facility].      Date:  12/12/2023   ID:  Kristine Mata, DOB September 06, 1956, MRN [id number] The patient was identified using 2 identifiers.  Patient Location: Home Provider Location: Home Office   PCP:  Del Nigel Berthold, [name]   [facility] Providers Cardiologist:  Marjo Bicker, MD     Evaluation Performed:  New Patient Evaluation  Chief Complaint:  OSA  History of Present Illness:    Kristine Mata is a 68 y.o. female who is being seen today for the evaluation of OSA at the request of Vixhnu Mallipeddi, MD.  Kristine Mata is a 68 y.o. female with a hx of HLD, HTN, heart murmur, SVT who saw Dr. Jenene Slicker 9//2024 and complained of worsening palpitations at night, bad dreams and morbid obesity.  She underwent HST showing moderate OSA with an AHI of 14.8/hr.  She was started on auto CPAP from 4 to 15cm H2O with heated humidity and mask of choice.    She is doing well with her PAP device and thinks that she has gotten used to it.  She tolerates the mask and feels the pressure is adequate.  Since going on PAP she feels rested in the am and has no significant daytime sleepiness.  She is having a lot of problems with nasal and mouth dryness.  She denies any significant mouth or nasal congestion.  She does not think that he snores.     Past Medical History:  Diagnosis Date   Ectopic pregnancy    Heart murmur    Hyperlipidemia    Hypertension    Insomnia, unspecified    OSA on CPAP    moderate OSA  with an AHI of 14.8/hr on auto CPAP   Osteopenia 03/12/2021   Right knee pain    SVT (supraventricular tachycardia) (HCC)    Past Surgical History:  Procedure Laterality Date   ABDOMINAL HYSTERECTOMY     BREAST CYST EXCISION Left    68 years old   COLONOSCOPY  2016   [facility]; 1 tubular adenoma, 1 hyperplastic polyp, TI biopsy with reactive lymphoid hyperplasia without active or chronic enteritis.   COLONOSCOPY N/A 12/02/2021   Procedure: COLONOSCOPY;  Surgeon: Corbin Ade, MD;  [facility];  Service: Endoscopy;  Laterality: N/A;  7:30am   cyst left breast      SALPINGECTOMY Left    TMJ ARTHROPLASTY       Current Meds  Medication Sig   albuterol (VENTOLIN HFA) 108 (90 Base) MCG/ACT inhaler Inhale 2 puffs into the lungs every 6 (six) hours as needed for wheezing or shortness of breath.   carvedilol (COREG) 12.5 MG tablet Take 1 tablet (12.5 mg total) by mouth 2 (two) times daily with a meal. (Patient taking differently: Take 12.5 mg by mouth daily.)   fluticasone (FLONASE) 50 MCG/ACT nasal spray Place 2 sprays into both nostrils daily.   furosemide (LASIX) 80 MG tablet Take 1 tablet by mouth once daily   loratadine (  CLARITIN) 10 MG tablet Take 1 tablet (10 mg total) by mouth daily.   lovastatin (MEVACOR) 40 MG tablet Take 1 tablet (40 mg total) by mouth at bedtime.   Multiple Vitamins-Minerals (MULTIVITAMIN WITH MINERALS) tablet Take 1 tablet by mouth every other day.   tiZANidine (ZANAFLEX) 4 MG tablet Take 1 tablet (4 mg total) by mouth every 8 (eight) hours as needed for muscle spasms.   traZODone (DESYREL) 50 MG tablet Take 0.5-1 tablets (25-50 mg total) by mouth at bedtime as needed for sleep.     Allergies:   Diltiazem, Anesthesia s-i-40 [propofol], Codeine, Olmesartan, Naproxen, Norvasc [amlodipine], and Tamiflu [oseltamivir]   Social History   Tobacco Use   Smoking status: Never    Passive exposure: Never   Smokeless tobacco: Never  Vaping Use   Vaping  status: Never Used  Substance Use Topics   Alcohol use: Never   Drug use: Never     Family Hx: The patient's family history includes COPD in her brother and brother; Colon cancer in her sister and sister; Diabetes in her brother, mother, and sister; Heart disease in her brother and mother; Hypertension in her brother, brother, father, mother, sister, and sister; Lung disease in her brother; Pancreatic cancer in her mother; Stroke in her brother and maternal grandmother; Thyroid disease in her brother and mother. There is no history of Breast cancer.  ROS:   Please see the history of present illness.     All other systems reviewed and are negative.   Prior Sleep studies:   The following studies were reviewed today:  HST  Labs/Other Tests and Data Reviewed:     Recent Labs: 12/23/2022: ALT 15 03/21/2023: TSH 0.50 06/20/2023: Magnesium 2.5 09/12/2023: BUN 9; Creatinine, Ser 0.57; Hemoglobin 11.8; Platelets 303; Potassium 3.8; Sodium 144    Wt Readings from Last 3 Encounters:  09/12/23 (!) 308 lb 1.3 oz (139.7 kg)  09/08/23 (!) 308 lb 3.2 oz (139.8 kg)  08/12/23 (!) 305 lb (138.3 kg)     Risk Assessment/Calculations:      STOP-Bang Score:  3      Objective:    Vital Signs:  BP 135/80   Pulse 77   Ht 5\' 10"  (1.778 m)   BMI 44.20 kg/m    VITAL SIGNS:  reviewed GEN:  no acute distress EYES:  sclerae anicteric, EOMI - Extraocular Movements Intact RESPIRATORY:  normal respiratory effort, symmetric expansion CARDIOVASCULAR:  no peripheral edema SKIN:  no rash, lesions or ulcers. MUSCULOSKELETAL:  no obvious deformities. NEURO:  alert and oriented x 3, no obvious focal deficit PSYCH:  normal affect  ASSESSMENT & PLAN:    OSA - The patient is tolerating PAP therapy well without any problems. The PAP download performed by his DME was personally reviewed and interpreted by me today and showed an AHI of 0.5/hr on auto CPAP from 4 to 15 cm H2O with 0% compliance in using more  than 4 hours nightly.  The patient has been using and benefiting from PAP use and will continue to benefit from therapy.  -I encouraged her to try to increase the humidity to help with mouth and nose dryness -I will send her a new mask that is FFM over the nose to see if it fits better -order supplies to change out cushion every 4 weeks -encouraged better compliance  HTN -BP controlled on exam today -continue prescription drug management with Carvedilol 12.5mg  BID with PRN refills   Time:   Today, I have  spent 15 minutes with the patient with telehealth technology discussing the above problems.     Medication Adjustments/Labs and Tests Ordered: Current medicines are reviewed at length with the patient today.  Concerns regarding medicines are outlined above.   Tests Ordered: No orders of the defined types were placed in this encounter.   Medication Changes: No orders of the defined types were placed in this encounter.   Follow Up:  In Person in 4 week(s)  Signed, Armanda Magic, MD  12/12/2023 9:05 AM    [facility]

## 2023-12-12 ENCOUNTER — Ambulatory Visit: Payer: Medicare HMO | Attending: Cardiology | Admitting: Cardiology

## 2023-12-12 VITALS — BP 135/80 | HR 77 | Ht 70.0 in

## 2023-12-12 DIAGNOSIS — I10 Essential (primary) hypertension: Secondary | ICD-10-CM

## 2023-12-12 DIAGNOSIS — G4733 Obstructive sleep apnea (adult) (pediatric): Secondary | ICD-10-CM

## 2023-12-12 NOTE — Patient Instructions (Signed)
Medication Instructions:  Your physician recommends that you continue on your current medications as directed. Please refer to the Current Medication list given to you today.  *If you need a refill on your cardiac medications before your next appointment, please call your pharmacy*   Lab Work: None.  If you have labs (blood work) drawn today and your tests are completely normal, you will receive your results only by: MyChart Message (if you have MyChart) OR A paper copy in the mail If you have any lab test that is abnormal or we need to change your treatment, we will call you to review the results.   Testing/Procedures: None.   Follow-Up:  Your next visit will  be a VIRTUAL VISIT on Tuesday, 01/17/2024 at 8:20 AM with:  Provider:   Dr. Armanda Magic, MD   Other Instructions Dr. Mayford Knife has sent new orders to your DME company. Someone from your DME company may reach out to confirm.

## 2023-12-13 ENCOUNTER — Other Ambulatory Visit: Payer: Self-pay

## 2023-12-13 DIAGNOSIS — G4733 Obstructive sleep apnea (adult) (pediatric): Secondary | ICD-10-CM

## 2023-12-13 DIAGNOSIS — I10 Essential (primary) hypertension: Secondary | ICD-10-CM

## 2023-12-13 NOTE — Progress Notes (Signed)
Per Dr. Mayford Knife, Order for a FFM (Full Face Mask) over the bridge of the nose as well as supplies to be able to change out cushion monthly sent to [facility] today 1/28

## 2023-12-24 DIAGNOSIS — R400 Somnolence: Secondary | ICD-10-CM | POA: Diagnosis not present

## 2023-12-24 DIAGNOSIS — G4733 Obstructive sleep apnea (adult) (pediatric): Secondary | ICD-10-CM | POA: Diagnosis not present

## 2023-12-26 ENCOUNTER — Ambulatory Visit: Payer: Self-pay | Admitting: Family Medicine

## 2023-12-29 NOTE — Telephone Encounter (Signed)
Copied from CRM [id number]. Topic: Clinical - Medication Question >> Dec 29, 2023 10:07 AM Geroge Baseman wrote: Reason for CRM: COREG patient has two bottles of the medication, and one says to take 12.5 once a day, one says 12.5 twice a day. Please call patient to advise which dosage she needs to be taking.

## 2024-01-04 ENCOUNTER — Telehealth: Payer: Self-pay

## 2024-01-04 NOTE — Telephone Encounter (Signed)
 Copied from CRM [id number]. Topic: General - Billing Inquiry >> Jan 04, 2024 11:42 AM Eunice Blase wrote: Reason for CRM: Pt needs copy of payment of copay for doctor's visits from present to all of 2024. Please call pt [phone].Please mail to: [address] [facility] [place] [address]

## 2024-01-12 DIAGNOSIS — Z6841 Body Mass Index (BMI) 40.0 and over, adult: Secondary | ICD-10-CM | POA: Diagnosis not present

## 2024-01-12 DIAGNOSIS — R030 Elevated blood-pressure reading, without diagnosis of hypertension: Secondary | ICD-10-CM | POA: Diagnosis not present

## 2024-01-12 DIAGNOSIS — I10 Essential (primary) hypertension: Secondary | ICD-10-CM | POA: Diagnosis not present

## 2024-01-13 ENCOUNTER — Telehealth: Payer: Self-pay | Admitting: Internal Medicine

## 2024-01-13 NOTE — Telephone Encounter (Signed)
 Informed patient that her appointment is only 10 days out.  No other spots available in office but will place her on wait list.  Stated she could come at anytime.  Was appreciative of the call back.

## 2024-01-13 NOTE — Telephone Encounter (Signed)
 Patient said that her PCP wants her to get a sooner appt than 01/23/24. York Spaniel that she is frustrated that she can't get in when she needs to

## 2024-01-17 ENCOUNTER — Ambulatory Visit: Payer: Medicare HMO | Admitting: Cardiology

## 2024-01-23 ENCOUNTER — Ambulatory Visit: Payer: Medicare HMO | Admitting: Internal Medicine

## 2024-01-26 ENCOUNTER — Ambulatory Visit: Admitting: Orthopaedic Surgery

## 2024-02-03 DIAGNOSIS — M170 Bilateral primary osteoarthritis of knee: Secondary | ICD-10-CM | POA: Diagnosis not present

## 2024-02-13 ENCOUNTER — Ambulatory Visit: Admitting: "Endocrinology

## 2024-02-23 ENCOUNTER — Ambulatory Visit: Attending: Internal Medicine | Admitting: Internal Medicine

## 2024-02-23 ENCOUNTER — Encounter: Payer: Self-pay | Admitting: Internal Medicine

## 2024-02-23 VITALS — BP 138/88 | HR 67 | Ht 70.0 in | Wt 308.4 lb

## 2024-02-23 DIAGNOSIS — I471 Supraventricular tachycardia, unspecified: Secondary | ICD-10-CM | POA: Diagnosis not present

## 2024-02-23 DIAGNOSIS — I10 Essential (primary) hypertension: Secondary | ICD-10-CM

## 2024-02-23 DIAGNOSIS — R002 Palpitations: Secondary | ICD-10-CM | POA: Diagnosis not present

## 2024-02-23 MED ORDER — AMLODIPINE BESYLATE 2.5 MG PO TABS: 2.5000 mg | ORAL_TABLET | Freq: Every day | ORAL | 3 refills | Status: DC

## 2024-02-23 NOTE — Progress Notes (Signed)
 Cardiology Office Note  Date: 02/23/2024   ID: Carlita, Whitcomb Apr 04, 1956, MRN [id number]  PCP:  Rica Records, [name]  Cardiologist:  Marjo Bicker, MD Electrophysiologist:  None    History of Present Illness: ADNA NOFZIGER is a 68 y.o. female known to have HTN, HLD, paroxysmal SVT is here for follow-up visit.  Patient was referred to cardiology clinic in 2019 for evaluation of paroxysmal SVT. EKG showed NSR. She was already on metoprolol succinate at that time. Echocardiogram from 2019 showed normal LVEF and no valvular heart disease. PFO cannot be excluded. Repeat limited echocardiogram showed no PFO.  Event monitor from 04/2021 showed HR range 43-132 BPM with average HR 69 bpm, 1 run of NSVT lasting 5 beats, 2 runs of SVT, longest lasting 13 beats, second-degree heart block  (asymptomatic with no patient trigger), <1% PAC and <1% PVC burden.  Metoprolol was weaned off and switch to carvedilol for better HTN control.  He underwent another event monitor in December 2024 that did not reveal any evidence of malignant arrhythmias.  She was diagnosed with moderate OSA, currently on CPAP.  She is here for follow-up visit.  She continues to have palpitations at night and is tired of them.  She also lost her baby sister and nephew recently.  She is grieving and tearful during interview today.  Does not have other symptoms of dizziness, syncope, leg swelling.  No chest pains.  Past Medical History:  Diagnosis Date   Ectopic pregnancy    Heart murmur    Hyperlipidemia    Hypertension    Insomnia, unspecified    OSA on CPAP    moderate OSA with an AHI of 14.8/hr on auto CPAP   Osteopenia 03/12/2021   Right knee pain    SVT (supraventricular tachycardia) (HCC)     Past Surgical History:  Procedure Laterality Date   ABDOMINAL HYSTERECTOMY     BREAST CYST EXCISION Left    68 years old   COLONOSCOPY  2016   [facility]; 1 tubular adenoma, 1 hyperplastic polyp, TI biopsy  with reactive lymphoid hyperplasia without active or chronic enteritis.   COLONOSCOPY N/A 12/02/2021   Procedure: COLONOSCOPY;  Surgeon: Corbin Ade, MD;  [facility];  Service: Endoscopy;  Laterality: N/A;  7:30am   cyst left breast      SALPINGECTOMY Left    TMJ ARTHROPLASTY      Current Outpatient Medications  Medication Sig Dispense Refill   albuterol (VENTOLIN HFA) 108 (90 Base) MCG/ACT inhaler Inhale 2 puffs into the lungs every 6 (six) hours as needed for wheezing or shortness of breath. 8 g 2   amLODipine (NORVASC) 2.5 MG tablet Take 1 tablet (2.5 mg total) by mouth daily. 90 tablet 3   carvedilol (COREG) 12.5 MG tablet Take 1 tablet (12.5 mg total) by mouth 2 (two) times daily with a meal. (Patient taking differently: Take 12.5 mg by mouth daily.) 60 tablet 3   fluticasone (FLONASE) 50 MCG/ACT nasal spray Place 2 sprays into both nostrils daily. 48 g 1   furosemide (LASIX) 80 MG tablet Take 1 tablet by mouth once daily 90 tablet 0   loratadine (CLARITIN) 10 MG tablet Take 1 tablet (10 mg total) by mouth daily. 90 tablet 1   lovastatin (MEVACOR) 40 MG tablet Take 1 tablet (40 mg total) by mouth at bedtime. 100 tablet 0   Multiple Vitamins-Minerals (MULTIVITAMIN WITH MINERALS) tablet Take 1 tablet by mouth every other  day.     tiZANidine (ZANAFLEX) 4 MG tablet Take 1 tablet (4 mg total) by mouth every 8 (eight) hours as needed for muscle spasms. 30 tablet 1   No current facility-administered medications for this visit.   Allergies:  Diltiazem, Anesthesia s-i-40 [propofol], Codeine, Olmesartan, Naproxen, Norvasc [amlodipine], and Tamiflu [oseltamivir]   Social History: The patient  reports that she has never smoked. She has never been exposed to tobacco smoke. She has never used smokeless tobacco. She reports that she does not drink alcohol and does not use drugs.   Family History: The patient's family history includes COPD in her brother and brother; Colon cancer in  her sister and sister; Diabetes in her brother, mother, and sister; Heart disease in her brother and mother; Hypertension in her brother, brother, father, mother, sister, and sister; Lung disease in her brother; Pancreatic cancer in her mother; Stroke in her brother and maternal grandmother; Thyroid disease in her brother and mother.   ROS:  Please see the history of present illness. Otherwise, complete review of systems is positive for none  All other systems are reviewed and negative.   Physical Exam: VS:  BP 138/88   Pulse 67   Ht 5\' 10"  (1.778 m)   Wt (!) 308 lb 6.4 oz (139.9 kg)   SpO2 97%   BMI 44.25 kg/m , BMI Body mass index is 44.25 kg/m.  Wt Readings from Last 3 Encounters:  02/23/24 (!) 308 lb 6.4 oz (139.9 kg)  09/12/23 (!) 308 lb 1.3 oz (139.7 kg)  09/08/23 (!) 308 lb 3.2 oz (139.8 kg)    General: Patient appears comfortable at rest. HEENT: Conjunctiva and lids normal, oropharynx clear with moist mucosa. Neck: Supple, no elevated JVP or carotid bruits, no thyromegaly. Lungs: Clear to auscultation, nonlabored breathing at rest. Cardiac: Regular rate and rhythm, no S3 or significant systolic murmur, no pericardial rub. Abdomen: Soft, nontender, no hepatomegaly, bowel sounds present, no guarding or rebound. Extremities: No pitting edema, distal pulses 2+. Skin: Warm and dry. Musculoskeletal: No kyphosis. Neuropsychiatric: Alert and oriented x3, affect grossly appropriate.  Recent Labwork: 03/21/2023: TSH 0.50 06/20/2023: Magnesium 2.5 09/12/2023: BUN 9; Creatinine, Ser 0.57; Hemoglobin 11.8; Platelets 303; Potassium 3.8; Sodium 144     Component Value Date/Time   CHOL 181 09/12/2023 1004   TRIG 93 09/12/2023 1004   HDL 75 09/12/2023 1004   CHOLHDL 2.4 09/12/2023 1004   LDLCALC 89 09/12/2023 1004     Assessment and Plan:   # Palpitations -Ongoing palpitations since 2019 that occur especially at night.  Event monitor was unremarkable for any sustained malignant  arrhythmias. Currently on carvedilol 12.5 mg twice daily.  She lost her baby sister and nephew recently, under a lot of stress and tearful during interview today.  She says she is worried if she might die in sleep. She probably needs sleep medications at night.  Instructed her to follow-up with PCP.  # Moderate OSA -Continue CPAP.  # HTN, partially controlled -Recently lost her baby sister and nephew.  She is under a lot of stress and grieving.  Tearful during interview today.  Her blood pressures are also elevated, 160 mmHg SBP.  Will start amlodipine 2.5 mg once daily.  Patient agreeable to start amlodipine despite having leg swelling in the past.  Intolerant to diltiazem due to rash.  Metoprolol was switched to carvedilol for better HTN control, continue carvedilol 12.5 mg twice daily.  # HLD, at goal -Continue lovastatin 40 mg nightly, LDL  goal should be less than 100.  Follows up with PCP.  # Morbid obesity with BMI more than 40 -Lost lot of weight after joining [employer].  Continue heartily diet and exercise.  Can follow-up with PCP.     Disposition:  Follow up PRN  Signed Maziah Smola Verne Spurr, MD, 02/23/2024 12:21 PM    [facility] [address] [place], [place], [place] [address]

## 2024-02-23 NOTE — Patient Instructions (Addendum)
 Medication Instructions:  Your physician has recommended you make the following change in your medication:  Start raking Amlodipine 2.5 mg once daily Continue taking all other medications as prescribed  Labwork: None  Testing/Procedures: None  Follow-Up: Your physician recommends that you schedule a follow-up appointment in: Follow up as needed  Any Other Special Instructions Will Be Listed Below (If Applicable). Thank you for choosing  [facility]!     If you need a refill on your cardiac medications before your next appointment, please call your pharmacy.

## 2024-02-24 ENCOUNTER — Ambulatory Visit (INDEPENDENT_AMBULATORY_CARE_PROVIDER_SITE_OTHER): Payer: Self-pay | Admitting: Family Medicine

## 2024-02-24 ENCOUNTER — Encounter: Payer: Self-pay | Admitting: Family Medicine

## 2024-02-24 VITALS — BP 143/84 | HR 72 | Ht 70.0 in | Wt 308.1 lb

## 2024-02-24 DIAGNOSIS — E559 Vitamin D deficiency, unspecified: Secondary | ICD-10-CM

## 2024-02-24 DIAGNOSIS — R7303 Prediabetes: Secondary | ICD-10-CM | POA: Diagnosis not present

## 2024-02-24 DIAGNOSIS — E038 Other specified hypothyroidism: Secondary | ICD-10-CM | POA: Diagnosis not present

## 2024-02-24 DIAGNOSIS — F4321 Adjustment disorder with depressed mood: Secondary | ICD-10-CM | POA: Diagnosis not present

## 2024-02-24 DIAGNOSIS — M25561 Pain in right knee: Secondary | ICD-10-CM

## 2024-02-24 DIAGNOSIS — I10 Essential (primary) hypertension: Secondary | ICD-10-CM | POA: Diagnosis not present

## 2024-02-24 DIAGNOSIS — G4709 Other insomnia: Secondary | ICD-10-CM | POA: Diagnosis not present

## 2024-02-24 DIAGNOSIS — Z13820 Encounter for screening for osteoporosis: Secondary | ICD-10-CM

## 2024-02-24 DIAGNOSIS — J069 Acute upper respiratory infection, unspecified: Secondary | ICD-10-CM | POA: Diagnosis not present

## 2024-02-24 MED ORDER — TRAZODONE HCL 50 MG PO TABS: 50.0000 mg | ORAL_TABLET | Freq: Every evening | ORAL | 3 refills | Status: AC | PRN

## 2024-02-24 MED ORDER — LORATADINE 10 MG PO TABS: 10.0000 mg | ORAL_TABLET | Freq: Every day | ORAL | 1 refills | Status: DC

## 2024-02-24 MED ORDER — FUROSEMIDE 80 MG PO TABS: 80.0000 mg | ORAL_TABLET | Freq: Every day | ORAL | 0 refills | Status: DC

## 2024-02-24 MED ORDER — FLUTICASONE PROPIONATE 50 MCG/ACT NA SUSP: 2.0000 | Freq: Every day | NASAL | 1 refills | Status: AC

## 2024-02-24 MED ORDER — ALBUTEROL SULFATE HFA 108 (90 BASE) MCG/ACT IN AERS: 2.0000 | INHALATION_SPRAY | Freq: Four times a day (QID) | RESPIRATORY_TRACT | 2 refills | Status: DC | PRN

## 2024-02-24 MED ORDER — TIZANIDINE HCL 4 MG PO TABS: 4.0000 mg | ORAL_TABLET | Freq: Two times a day (BID) | ORAL | 2 refills | Status: DC | PRN

## 2024-02-24 NOTE — Assessment & Plan Note (Signed)
 Referral placed to therapy Patient crying during today visit due to sister and nephew passing away a few months ago  Patient verbally consented to [facility] services about presenting concerns and psychiatric consultation as appropriate. The services will be billed as appropriate for the patient.

## 2024-02-24 NOTE — Assessment & Plan Note (Signed)
 Trial Trazodone 50 mg Explained to go to bed at the same time each night and get up at the same time each morning, including on the weekends. Make sure your bedroom is quiet, dark, relaxing, and at a comfortable temperature. Remove electronic devices, such as TVs, computers, and smart phones, from the bedroom.

## 2024-02-24 NOTE — Patient Instructions (Signed)

## 2024-02-24 NOTE — Assessment & Plan Note (Signed)
 Xray ordered  Zanaflex 4 mg PRN  Advise patient treatment plan for knee pain includes rest, applying ice for 15-20 minutes several times a day, and gentle stretching and strengthening exercises to improve muscle support. Possible Physical therapy referral may help with proper technique. Using a knee brace or compression bandage can provide support, and elevating the leg helps reduce swelling. Low-impact exercises like swimming or cycling should be introduced gradually, and maintaining a healthy weight can prevent future pain.

## 2024-02-24 NOTE — Progress Notes (Signed)
 Established Patient Office Visit   Subjective  Patient ID: Kristine Mata, female    DOB: 12-Apr-1956  Age: 68 y.o. MRN: [id number]  Chief Complaint  Patient presents with   Medical Management of Chronic Issues    Medication questions  Recent fall causing injury and pain in knee.  Issues w/ sleep     She  has a past medical history of Ectopic pregnancy, Heart murmur, Hyperlipidemia, Hypertension, Insomnia, unspecified, OSA on CPAP, Osteopenia (03/12/2021), Right knee pain, and SVT (supraventricular tachycardia) (HCC).  Insomnia The patient reports chronic insomnia characterized by fragmented sleep, difficulty initiating sleep, and general sleep disturbances. Symptoms occur nightly and have been gradually worsening over time. Contributing factors include anxiety and emotional stress. Caffeine intake is minimal, with consumption limited to 0-1 beverage per day, typically coffee. Symptoms are somewhat alleviated in a dark, quiet environment. The patient has previously tried Tylenol PM without significant relief. Usual bedtime is between 10:00 and 11:00 PM, but sleep onset typically takes over an hour. Relevant medical history includes hypertension and ongoing family-related stress and anxiety.  Right Knee Pain The patient presents with acute right knee and hip pain following a fall in a parking lot. She describes the pain as aching, cramping, and stabbing, with a severity of 10/10. The pain has been fluctuating since the incident. Associated symptoms include difficulty bearing weight and muscle weakness. She denies any numbness, tingling, or presence of foreign bodies. The pain is exacerbated by movement, palpation, and weight-bearing. Interventions including rest, non-weight bearing, and NSAIDs have been attempted without relief.    Review of Systems  Constitutional:  Negative for chills and fever.  Respiratory:  Negative for shortness of breath.   Cardiovascular:  Negative for chest  pain.  Gastrointestinal:  Negative for abdominal pain.  Genitourinary:  Negative for dysuria.  Musculoskeletal:  Positive for joint pain and myalgias.       Right knee pain  Neurological:  Negative for tingling.  Psychiatric/Behavioral:  The patient is nervous/anxious and has insomnia.       Objective:     BP (!) 143/84   Pulse 72   Ht 5\' 10"  (1.778 m)   Wt (!) 308 lb 1.9 oz (139.8 kg)   SpO2 94%   BMI 44.21 kg/m  BP Readings from Last 3 Encounters:  02/24/24 (!) 143/84  02/23/24 138/88  12/12/23 135/80      Physical Exam Vitals reviewed.  Constitutional:      General: She is not in acute distress.    Appearance: Normal appearance. She is not ill-appearing, toxic-appearing or diaphoretic.  HENT:     Head: Normocephalic.  Eyes:     General:        Right eye: No discharge.        Left eye: No discharge.     Conjunctiva/sclera: Conjunctivae normal.  Cardiovascular:     Rate and Rhythm: Normal rate.     Pulses: Normal pulses.     Heart sounds: Normal heart sounds.  Pulmonary:     Effort: Pulmonary effort is normal. No respiratory distress.     Breath sounds: Normal breath sounds.  Abdominal:     Tenderness: There is no left CVA tenderness.  Musculoskeletal:     Right knee: Decreased range of motion. Tenderness present.     Left knee: Decreased range of motion. No tenderness.  Skin:    General: Skin is warm and dry.     Capillary Refill: Capillary refill takes less than 2  seconds.  Neurological:     General: No focal deficit present.     Mental Status: She is alert and oriented to person, place, and time.  Psychiatric:        Mood and Affect: Mood normal.        Behavior: Behavior normal.      No results found for any visits on 02/24/24.  The 10-year ASCVD risk score (Arnett DK, et al., 2019) is: 13%    Assessment & Plan:  Primary hypertension -     Lipid panel -     CMP14+EGFR -     CBC with Differential/Platelet  TSH (thyroid-stimulating hormone  deficiency) -     TSH + free T4  Vitamin D deficiency -     VITAMIN D 25 Hydroxy (Vit-D Deficiency, Fractures)  Prediabetes -     Hemoglobin A1c  Screening for osteoporosis -     DG Bone Density; Future  Viral URI with cough -     Loratadine; Take 1 tablet (10 mg total) by mouth daily.  Dispense: 90 tablet; Refill: 1  Essential hypertension -     Furosemide; Take 1 tablet (80 mg total) by mouth daily.  Dispense: 90 tablet; Refill: 0  Acute pain of right knee Assessment & Plan: Xray ordered  Zanaflex 4 mg PRN  Advise patient treatment plan for knee pain includes rest, applying ice for 15-20 minutes several times a day, and gentle stretching and strengthening exercises to improve muscle support. Possible Physical therapy referral may help with proper technique. Using a knee brace or compression bandage can provide support, and elevating the leg helps reduce swelling. Low-impact exercises like swimming or cycling should be introduced gradually, and maintaining a healthy weight can prevent future pain.   Orders: -     DG Knee Complete 4 Views Right; Future  Grieving Assessment & Plan: Referral placed to therapy Patient crying during today visit due to sister and nephew passing away a few months ago  Patient verbally consented to [facility] services about presenting concerns and psychiatric consultation as appropriate. The services will be billed as appropriate for the patient.     Orders: -     Ambulatory referral to Behavioral Health  Right knee pain, unspecified chronicity Assessment & Plan: Xray ordered  Zanaflex 4 mg PRN  Advise patient treatment plan for knee pain includes rest, applying ice for 15-20 minutes several times a day, and gentle stretching and strengthening exercises to improve muscle support. Possible Physical therapy referral may help with proper technique. Using a knee brace or compression bandage can provide support, and elevating  the leg helps reduce swelling. Low-impact exercises like swimming or cycling should be introduced gradually, and maintaining a healthy weight can prevent future pain.    Secondary insomnia Assessment & Plan: Trial Trazodone 50 mg Explained to go to bed at the same time each night and get up at the same time each morning, including on the weekends. Make sure your bedroom is quiet, dark, relaxing, and at a comfortable temperature. Remove electronic devices, such as TVs, computers, and smart phones, from the bedroom.    Other orders -     tiZANidine HCl; Take 1 tablet (4 mg total) by mouth 2 (two) times daily as needed for muscle spasms.  Dispense: 60 tablet; Refill: 2 -     Fluticasone Propionate; Place 2 sprays into both nostrils daily.  Dispense: 48 g; Refill: 1 -     Albuterol Sulfate HFA;  Inhale 2 puffs into the lungs every 6 (six) hours as needed for wheezing or shortness of breath.  Dispense: 8 g; Refill: 2 -     traZODone HCl; Take 1 tablet (50 mg total) by mouth at bedtime as needed.  Dispense: 30 tablet; Refill: 3    Return in about 4 months (around 06/25/2024), or if symptoms worsen or fail to improve, for chronic follow-up.   Cruzita Lederer Newman Nip, [name]

## 2024-03-01 DIAGNOSIS — R7303 Prediabetes: Secondary | ICD-10-CM | POA: Diagnosis not present

## 2024-03-01 DIAGNOSIS — I10 Essential (primary) hypertension: Secondary | ICD-10-CM | POA: Diagnosis not present

## 2024-03-01 DIAGNOSIS — E559 Vitamin D deficiency, unspecified: Secondary | ICD-10-CM | POA: Diagnosis not present

## 2024-03-01 DIAGNOSIS — E038 Other specified hypothyroidism: Secondary | ICD-10-CM | POA: Diagnosis not present

## 2024-03-02 ENCOUNTER — Encounter: Payer: Self-pay | Admitting: Family Medicine

## 2024-03-02 LAB — CBC WITH DIFFERENTIAL/PLATELET
Basophils Absolute: 0 10*3/uL (ref 0.0–0.2)
Basos: 1 %
EOS (ABSOLUTE): 0.1 10*3/uL (ref 0.0–0.4)
Eos: 2 %
Hematocrit: 38 % (ref 34.0–46.6)
Hemoglobin: 11.8 g/dL (ref 11.1–15.9)
Immature Grans (Abs): 0 10*3/uL (ref 0.0–0.1)
Immature Granulocytes: 0 %
Lymphocytes Absolute: 2.8 10*3/uL (ref 0.7–3.1)
Lymphs: 48 %
MCH: 27.7 pg (ref 26.6–33.0)
MCHC: 31.1 g/dL — ABNORMAL LOW (ref 31.5–35.7)
MCV: 89 fL (ref 79–97)
Monocytes Absolute: 0.5 10*3/uL (ref 0.1–0.9)
Monocytes: 8 %
Neutrophils Absolute: 2.5 10*3/uL (ref 1.4–7.0)
Neutrophils: 41 %
Platelets: 314 10*3/uL (ref 150–450)
RBC: 4.26 x10E6/uL (ref 3.77–5.28)
RDW: 13.8 % (ref 11.7–15.4)
WBC: 6 10*3/uL (ref 3.4–10.8)

## 2024-03-02 LAB — LIPID PANEL
Chol/HDL Ratio: 2.4 ratio (ref 0.0–4.4)
Cholesterol, Total: 174 mg/dL (ref 100–199)
HDL: 73 mg/dL (ref >39)
LDL Chol Calc (NIH): 84 mg/dL (ref 0–99)
Triglycerides: 93 mg/dL (ref 0–149)
VLDL Cholesterol Cal: 17 mg/dL (ref 5–40)

## 2024-03-02 LAB — CMP14+EGFR
ALT: 13 IU/L (ref 0–32)
AST: 22 IU/L (ref 0–40)
Albumin: 4.5 g/dL (ref 3.9–4.9)
Alkaline Phosphatase: 212 IU/L — ABNORMAL HIGH (ref 44–121)
BUN/Creatinine Ratio: 19 (ref 12–28)
BUN: 11 mg/dL (ref 8–27)
Bilirubin Total: 0.3 mg/dL (ref 0.0–1.2)
CO2: 23 mmol/L (ref 20–29)
Calcium: 10.9 mg/dL — ABNORMAL HIGH (ref 8.7–10.3)
Chloride: 103 mmol/L (ref 96–106)
Creatinine, Ser: 0.58 mg/dL (ref 0.57–1.00)
Globulin, Total: 2.6 g/dL (ref 1.5–4.5)
Glucose: 116 mg/dL — ABNORMAL HIGH (ref 70–99)
Potassium: 4.2 mmol/L (ref 3.5–5.2)
Sodium: 143 mmol/L (ref 134–144)
Total Protein: 7.1 g/dL (ref 6.0–8.5)
eGFR: 98 mL/min/{1.73_m2} (ref >59)

## 2024-03-02 LAB — TSH+FREE T4
Free T4: 1.27 ng/dL (ref 0.82–1.77)
TSH: 0.633 u[IU]/mL (ref 0.450–4.500)

## 2024-03-02 LAB — HEMOGLOBIN A1C
Est. average glucose Bld gHb Est-mCnc: 126 mg/dL
Hgb A1c MFr Bld: 6 % — ABNORMAL HIGH (ref 4.8–5.6)

## 2024-03-02 LAB — VITAMIN D 25 HYDROXY (VIT D DEFICIENCY, FRACTURES): Vit D, 25-Hydroxy: 22.3 ng/mL — ABNORMAL LOW (ref 30.0–100.0)

## 2024-03-05 ENCOUNTER — Encounter: Payer: Self-pay | Admitting: "Endocrinology

## 2024-03-05 ENCOUNTER — Ambulatory Visit: Admitting: "Endocrinology

## 2024-03-05 VITALS — BP 118/86 | HR 72 | Ht 70.0 in | Wt 303.0 lb

## 2024-03-05 DIAGNOSIS — E210 Primary hyperparathyroidism: Secondary | ICD-10-CM | POA: Diagnosis not present

## 2024-03-05 DIAGNOSIS — E559 Vitamin D deficiency, unspecified: Secondary | ICD-10-CM

## 2024-03-05 NOTE — Progress Notes (Signed)
 Outpatient Endocrinology Note Jorge Newcomer, MD    Kristine Mata Aug 06, 1956 [id number]  Referring Provider: Del Orbe Polanco, Ilian* Primary Care Provider: Rosanna Comment, [name] Reason for consultation: Subjective   Assessment & Plan  Diagnoses and all orders for this visit:  Hyperparathyroidism, primary (HCC) -     DG Bone Density -     PTH, Intact and Calcium   Hypercalcemia  Vitamin D  deficiency   Primary hyperparathyroidism, c/o intermittent abdominal pain, osteopenia per 2022 DXA 24 hr urine Ca 12/2022 was 333 (no total urine mentioned)  02/2023 NM PARATHYROID  SCINTIGRAPHY AND SPECT IMAGING showed activity localized to the inferior posterior LEFT lobe of thyroid  gland is suggestive of parathyroid  adenoma. 02/2021 DXA showed T score -1.2 right femur    12/16/22: 24 hour urine Ca 333 Patient is not keen on surgery No indication for surgery currently: calcium  stable at 10.5, ordered PTH Recommend 1000 international units Vit D every day  Ordered DXA: pending, reinforced  Discussed importance of good hydration to prevent severe hypercalcemia:  - 8 eight ounce glasses of fluids per day.  - low threshold for ER visit for IV fluids if having     nausea/vomiting/diarrhea and cannot keep well hydrated.   Return in about 4 months (around 07/05/2024) for visit + labs before next visit, labs today.   I have reviewed current medications, nurse's notes, allergies, vital signs, past medical and surgical history, family medical history, and social history for this encounter. Counseled patient on symptoms, examination findings, lab findings, imaging results, treatment decisions and monitoring and prognosis. The patient understood the recommendations and agrees with the treatment plan. All questions regarding treatment plan were fully answered.  Jorge Newcomer, MD  03/05/24   History of Present Illness HPI  Kristine Mata is a 68 y.o. female referred by Dr. Fleta Human  Polanco for follow up for primary hyperparathyroidism driven hypercalcemia.    Interval history: C/o 2 falls since last seen but not fractures No heart burn/nausea/vomiting Sometimes gets abdominal pain when constipated C/o depression due to death of sister and nephew  Initial history:   She was found to have elevated serum calcium  of 10.4 (corrected for 12/2022) mg/dL in with a high intact PTH level of 108 pg/ml. Patient has had a high Calcium  at least since 08/2021.    Component     Latest Ref Rng 06/20/2023  Sodium     135 - 145 mEq/L 141   Potassium     3.5 - 5.1 mEq/L 4.1   Chloride     96 - 112 mEq/L 102   CO2     19 - 32 mEq/L 29   Albumin     3.5 - 5.2 g/dL 4.6   BUN     6 - 23 mg/dL 14   Creatinine     6.57 - 1.20 mg/dL 8.46   Glucose     70 - 99 mg/dL 97   Phosphorus     2.3 - 4.6 mg/dL 3.5   GFR     >96.29 mL/min 89.17   Calcium      8.4 - 10.5 mg/dL 52.8 (H)   Calcium      8.7 - 10.3 mg/dL 41.3 (H)   Vitamin D  1, 25 (OH) Total     18 - 72 pg/mL 97 (H)   Vitamin D3 1, 25 (OH)     pg/mL 97   Vitamin D2 1, 25 (OH)     pg/mL <8  PTH, Intact     15 - 65 pg/mL 133 (H)   PTH Interp Comment   VITD     30.00 - 100.00 ng/mL 26.13 (L)     No new symptoms Patient feel well except BP issues being managed by another provider   Initial history:   Patient denies a history of kidney stones.  She  current hematuria No Polyuria No Nocturia Once a night  Excessive Thirst  renal failure No Anorexia No abdominal pain yes, twice a week Heartburn No constipation sometimes  nausea or vomiting no  h/o peptic ulcer disease no  depression no  confusion no excessive fatigue no fracture yes, ground level in 2015-2016 osteoporosis no, osteopenia  Headaches sometimes Numbness no Tingling yes  She  take Calcium  No She  take Vitamin D  supplements No  She denies a history of taking chronic lithium No She denies a history of thiazide diuretics.   She denies  self/family history of renal stones/hypercalcemia (except niece) denies a personal history of MEN syndromes/medullary thyroid  cancer/ pheochromocytoma  Physical Exam  BP 118/86   Pulse 72   Ht 5\' 10"  (1.778 m)   Wt (!) 303 lb (137.4 kg)   SpO2 97%   BMI 43.48 kg/m    Constitutional: well developed, well nourished Head: normocephalic, atraumatic Eyes: sclera anicteric, no redness Neck: supple Lungs: normal respiratory effort Neurology: alert and oriented Skin: dry, no appreciable rashes Musculoskeletal: no appreciable defects Psychiatric: normal mood and affect   Current Medications Patient's Medications  New Prescriptions   No medications on file  Previous Medications   ALBUTEROL  (VENTOLIN  HFA) 108 (90 BASE) MCG/ACT INHALER    Inhale 2 puffs into the lungs every 6 (six) hours as needed for wheezing or shortness of breath.   AMLODIPINE  (NORVASC ) 2.5 MG TABLET    Take 1 tablet (2.5 mg total) by mouth daily.   CARVEDILOL  (COREG ) 12.5 MG TABLET    Take 1 tablet (12.5 mg total) by mouth 2 (two) times daily with a meal.   FLUTICASONE  (FLONASE ) 50 MCG/ACT NASAL SPRAY    Place 2 sprays into both nostrils daily.   FUROSEMIDE  (LASIX ) 80 MG TABLET    Take 1 tablet (80 mg total) by mouth daily.   LORATADINE  (CLARITIN ) 10 MG TABLET    Take 1 tablet (10 mg total) by mouth daily.   LOVASTATIN  (MEVACOR ) 40 MG TABLET    Take 1 tablet (40 mg total) by mouth at bedtime.   MULTIPLE VITAMINS-MINERALS (MULTIVITAMIN WITH MINERALS) TABLET    Take 1 tablet by mouth every other day.   TIZANIDINE  (ZANAFLEX ) 4 MG TABLET    Take 1 tablet (4 mg total) by mouth 2 (two) times daily as needed for muscle spasms.   TRAZODONE  (DESYREL ) 50 MG TABLET    Take 1 tablet (50 mg total) by mouth at bedtime as needed.  Modified Medications   No medications on file  Discontinued Medications   No medications on file    Allergies Allergies  Allergen Reactions   Diltiazem  Rash   Anesthesia S-I-40 [Propofol] Nausea And  Vomiting   Codeine Nausea And Vomiting   Olmesartan      Edema in feet   Naproxen  Palpitations   Norvasc  [Amlodipine ] Other (See Comments)    Edema at 2.5 mg dosage.   Tamiflu  [Oseltamivir ] Rash and Cough    Rash, Cough, and Wheezing    Past Medical History Past Medical History:  Diagnosis Date   Ectopic pregnancy    Heart murmur  Hyperlipidemia    Hypertension    Insomnia, unspecified    OSA on CPAP    moderate OSA with an AHI of 14.8/hr on auto CPAP   Osteopenia 03/12/2021   Right knee pain    SVT (supraventricular tachycardia) (HCC)     Past Surgical History Past Surgical History:  Procedure Laterality Date   ABDOMINAL HYSTERECTOMY     BREAST CYST EXCISION Left    68 years old   COLONOSCOPY  2016   [facility]; 1 tubular adenoma, 1 hyperplastic polyp, TI biopsy with reactive lymphoid hyperplasia without active or chronic enteritis.   COLONOSCOPY N/A 12/02/2021   Procedure: COLONOSCOPY;  Surgeon: Suzette Espy, MD;  Location: [facility];  Service: Endoscopy;  Laterality: N/A;  7:30am   cyst left breast      SALPINGECTOMY Left    TMJ ARTHROPLASTY      Family History family history includes COPD in her brother and brother; Colon cancer in her sister and sister; Diabetes in her brother, mother, and sister; Heart disease in her brother and mother; Hypertension in her brother, brother, father, mother, sister, and sister; Lung disease in her brother; Pancreatic cancer in her mother; Stroke in her brother and maternal grandmother; Thyroid  disease in her brother and mother.  Social History Social History   Socioeconomic History   Marital status: Divorced    Spouse name: Not on file   Number of children: 1   Years of education: 12   Highest education level: 12th grade  Occupational History   Occupation: Retired  Tobacco Use   Smoking status: Never    Passive exposure: Never   Smokeless tobacco: Never  Vaping Use   Vaping status: Never Used  Substance and Sexual  Activity   Alcohol use: Never   Drug use: Never   Sexual activity: Not Currently    Partners: Male    Birth control/protection: Surgical  Other Topics Concern   Not on file  Social History Narrative   Lives alone. Has one son - lives in [place].   Social Drivers of Health   Financial Resource Strain: Low Risk  (03/16/2023)   Overall Financial Resource Strain (CARDIA)    Difficulty of Paying Living Expenses: Not hard at all  Food Insecurity: Food Insecurity Present (09/20/2023)   Hunger Vital Sign    Worried About Running Out of Food in the Last Year: Never true    Ran Out of Food in the Last Year: Sometimes true  Transportation Needs: No Transportation Needs (09/20/2023)   PRAPARE - Administrator, Civil Service (Medical): No    Lack of Transportation (Non-Medical): No  Physical Activity: Inactive (03/16/2023)   Exercise Vital Sign    Days of Exercise per Week: 0 days    Minutes of Exercise per Session: 0 min  Stress: No Stress Concern Present (03/16/2023)   [employer] of Occupational Health - Occupational Stress Questionnaire    Feeling of Stress : Only a little  Social Connections: Moderately Isolated (03/16/2023)   Social Connection and Isolation Panel [NHANES]    Frequency of Communication with Friends and Family: More than three times a week    Frequency of Social Gatherings with Friends and Family: More than three times a week    Attends Religious Services: More than 4 times per year    Active Member of [employer] or Organizations: No    Attends Banker Meetings: Never    Marital Status: Divorced  Catering manager Violence: Not  At Risk (03/16/2023)   Humiliation, Afraid, Rape, and Kick questionnaire    Fear of Current or Ex-Partner: No    Emotionally Abused: No    Physically Abused: No    Sexually Abused: No    Lab Results  Component Value Date   CHOL 174 03/01/2024   Lab Results  Component Value Date   HDL 73 03/01/2024   Lab Results   Component Value Date   LDLCALC 84 03/01/2024   Lab Results  Component Value Date   TRIG 93 03/01/2024   Lab Results  Component Value Date   CHOLHDL 2.4 03/01/2024   Lab Results  Component Value Date   CREATININE 0.58 03/01/2024   Lab Results  Component Value Date   GFR 89.17 06/20/2023      Component Value Date/Time   NA 143 03/01/2024 1026   K 4.2 03/01/2024 1026   CL 103 03/01/2024 1026   CO2 23 03/01/2024 1026   GLUCOSE 116 (H) 03/01/2024 1026   GLUCOSE 97 06/20/2023 1401   BUN 11 03/01/2024 1026   CREATININE 0.58 03/01/2024 1026   CALCIUM  10.9 (H) 03/01/2024 1026   PROT 7.1 03/01/2024 1026   ALBUMIN 4.5 03/01/2024 1026   AST 22 03/01/2024 1026   ALT 13 03/01/2024 1026   ALKPHOS 212 (H) 03/01/2024 1026   BILITOT 0.3 03/01/2024 1026   GFRNONAA >60 08/04/2022 2143   GFRAA 106 07/15/2020 1419      Latest Ref Rng & Units 03/01/2024   10:26 AM 09/12/2023   10:04 AM 06/20/2023    2:01 PM  BMP  Glucose 70 - 99 mg/dL 409  811  97   BUN 8 - 27 mg/dL 11  9  14    Creatinine 0.57 - 1.00 mg/dL 9.14  7.82  9.56   BUN/Creat Ratio 12 - 28 19  16     Sodium 134 - 144 mmol/L 143  144  141   Potassium 3.5 - 5.2 mmol/L 4.2  3.8  4.1   Chloride 96 - 106 mmol/L 103  102  102   CO2 20 - 29 mmol/L 23  22  29    Calcium  8.7 - 10.3 mg/dL 21.3  08.6  57.8    46.9        Component Value Date/Time   WBC 6.0 03/01/2024 1026   WBC 7.8 08/04/2022 2143   RBC 4.26 03/01/2024 1026   RBC 4.46 08/04/2022 2143   HGB 11.8 03/01/2024 1026   HCT 38.0 03/01/2024 1026   PLT 314 03/01/2024 1026   MCV 89 03/01/2024 1026   MCH 27.7 03/01/2024 1026   MCH 29.4 08/04/2022 2143   MCHC 31.1 (L) 03/01/2024 1026   MCHC 32.1 08/04/2022 2143   RDW 13.8 03/01/2024 1026   LYMPHSABS 2.8 03/01/2024 1026   MONOABS 0.6 08/04/2022 2143   EOSABS 0.1 03/01/2024 1026   BASOSABS 0.0 03/01/2024 1026   Lab Results  Component Value Date   TSH 0.633 03/01/2024   TSH 0.50 03/21/2023   TSH 0.578 12/23/2022    FREET4 1.27 03/01/2024   FREET4 0.88 03/21/2023       Parts of this note may have been dictated using voice recognition software. There may be variances in spelling and vocabulary which are unintentional. Not all errors are proofread. Please notify the [facility] if any discrepancies are noted or if the meaning of any statement is not clear.

## 2024-03-06 ENCOUNTER — Other Ambulatory Visit: Payer: Self-pay | Admitting: "Endocrinology

## 2024-03-06 DIAGNOSIS — E210 Primary hyperparathyroidism: Secondary | ICD-10-CM

## 2024-03-06 DIAGNOSIS — E559 Vitamin D deficiency, unspecified: Secondary | ICD-10-CM

## 2024-03-06 LAB — PTH, INTACT AND CALCIUM
Calcium: 10.5 mg/dL — ABNORMAL HIGH (ref 8.6–10.4)
PTH: 298 pg/mL — ABNORMAL HIGH (ref 16–77)

## 2024-03-07 ENCOUNTER — Telehealth: Payer: Self-pay

## 2024-03-12 ENCOUNTER — Telehealth: Payer: Self-pay | Admitting: Internal Medicine

## 2024-03-12 NOTE — Telephone Encounter (Signed)
 Advised that she could stop amlodipine  for now, continue monitoring home blood pressures, and follow up with her family doctor to manage her blood pressure. Advised that Dr. Mallipeddi is out of the office and she would be contacted next week with her response. Verbalized understanding.

## 2024-03-12 NOTE — Telephone Encounter (Signed)
 Pt c/o medication issue:  1. Name of Medication: Amlodipine  25 mg  2. How are you currently taking this medication (dosage and times per day)?    3. Are you having a reaction (difficulty breathing--STAT)?   4. What is your medication issue? Patient wants to know if there is something else she can take? She says it is making her feet swell

## 2024-03-14 ENCOUNTER — Encounter: Payer: Self-pay | Admitting: Family Medicine

## 2024-03-14 ENCOUNTER — Ambulatory Visit (HOSPITAL_COMMUNITY)
Admission: RE | Admit: 2024-03-14 | Discharge: 2024-03-14 | Disposition: A | Discharge status: Home / Self Care | Source: Ambulatory Visit | Attending: Family Medicine | Admitting: Family Medicine

## 2024-03-14 ENCOUNTER — Ambulatory Visit (INDEPENDENT_AMBULATORY_CARE_PROVIDER_SITE_OTHER): Payer: Medicare HMO

## 2024-03-14 VITALS — BP 148/80 | Ht 70.0 in | Wt 300.0 lb

## 2024-03-14 DIAGNOSIS — Z7189 Other specified counseling: Secondary | ICD-10-CM

## 2024-03-14 DIAGNOSIS — Z789 Other specified health status: Secondary | ICD-10-CM

## 2024-03-14 DIAGNOSIS — E210 Primary hyperparathyroidism: Secondary | ICD-10-CM | POA: Diagnosis not present

## 2024-03-14 DIAGNOSIS — Z5941 Food insecurity: Secondary | ICD-10-CM | POA: Diagnosis not present

## 2024-03-14 DIAGNOSIS — Z9181 History of falling: Secondary | ICD-10-CM | POA: Diagnosis not present

## 2024-03-14 DIAGNOSIS — Z0100 Encounter for examination of eyes and vision without abnormal findings: Secondary | ICD-10-CM

## 2024-03-14 DIAGNOSIS — Z0001 Encounter for general adult medical examination with abnormal findings: Secondary | ICD-10-CM | POA: Diagnosis not present

## 2024-03-14 DIAGNOSIS — Z0000 Encounter for general adult medical examination without abnormal findings: Secondary | ICD-10-CM

## 2024-03-14 DIAGNOSIS — R2681 Unsteadiness on feet: Secondary | ICD-10-CM

## 2024-03-14 DIAGNOSIS — Z5982 Transportation insecurity: Secondary | ICD-10-CM | POA: Diagnosis not present

## 2024-03-14 NOTE — Progress Notes (Signed)
 Subjective:   Kristine Mata is a 68 y.o. who presents for a Medicare Wellness preventive visit.  Visit Complete: Virtual I connected with  Kristine Mata on 03/14/24 by a audio enabled telemedicine application and verified that I am speaking with the correct person using two identifiers.  Patient Location: Home  Provider Location: Office/Clinic  I discussed the limitations of evaluation and management by telemedicine. The patient expressed understanding and agreed to proceed.  Vital Signs: Because this visit was a virtual/telehealth visit, some criteria may be missing or patient reported. Any vitals not documented were not able to be obtained and vitals that have been documented are patient reported.  VideoDeclined- This patient declined Librarian, academic. Therefore the visit was completed with audio only.  Persons Participating in Visit: Patient.  AWV Questionnaire: No: Patient Medicare AWV questionnaire was not completed prior to this visit.  Cardiac Risk Factors include: advanced age (>26men, >38 women);dyslipidemia;sedentary lifestyle;obesity (BMI >30kg/m2);hypertension     Objective:    Today's Vitals   03/14/24 1322 03/14/24 1325  BP: (!) 148/80   Weight: 300 lb (136.1 kg)   Height: 5\' 10"  (1.778 m)   PainSc: 10-Worst pain ever 10-Worst pain ever  PainLoc: Knee    Body mass index is 43.05 kg/m.     03/14/2024    1:45 PM 03/16/2023    3:41 PM 03/09/2023    2:11 PM 06/14/2022   10:32 AM 12/02/2021    6:32 AM 06/11/2021   11:21 AM 03/17/2018   10:57 AM  Advanced Directives  Does Patient Have a Medical Advance Directive? No No No No No Yes No  Type of Holiday representative of Healthcare Power of Attorney in Chart?      No - copy requested   Would patient like information on creating a medical advance directive? No - Patient declined Yes (MAU/Ambulatory/Procedural Areas - Information given) No -  Patient declined No - Patient declined No - Patient declined      Current Medications (verified) Outpatient Encounter Medications as of 03/14/2024  Medication Sig   albuterol  (VENTOLIN  HFA) 108 (90 Base) MCG/ACT inhaler Inhale 2 puffs into the lungs every 6 (six) hours as needed for wheezing or shortness of breath.   amLODipine  (NORVASC ) 2.5 MG tablet Take 1 tablet (2.5 mg total) by mouth daily.   carvedilol  (COREG ) 12.5 MG tablet Take 1 tablet (12.5 mg total) by mouth 2 (two) times daily with a meal. (Patient taking differently: Take 12.5 mg by mouth daily.)   fluticasone  (FLONASE ) 50 MCG/ACT nasal spray Place 2 sprays into both nostrils daily.   furosemide  (LASIX ) 80 MG tablet Take 1 tablet (80 mg total) by mouth daily.   loratadine  (CLARITIN ) 10 MG tablet Take 1 tablet (10 mg total) by mouth daily.   lovastatin  (MEVACOR ) 40 MG tablet Take 1 tablet (40 mg total) by mouth at bedtime.   Multiple Vitamins-Minerals (MULTIVITAMIN WITH MINERALS) tablet Take 1 tablet by mouth every other day.   tiZANidine  (ZANAFLEX ) 4 MG tablet Take 1 tablet (4 mg total) by mouth 2 (two) times daily as needed for muscle spasms.   traZODone  (DESYREL ) 50 MG tablet Take 1 tablet (50 mg total) by mouth at bedtime as needed.   No facility-administered encounter medications on file as of 03/14/2024.    Allergies (verified) Diltiazem , Anesthesia s-i-40 [propofol], Codeine, Olmesartan , Naproxen , Norvasc  [amlodipine ], and Tamiflu  [oseltamivir ]   History: Past Medical History:  Diagnosis Date   Ectopic pregnancy    Heart murmur    Hyperlipidemia    Hypertension    Insomnia, unspecified    OSA on CPAP    moderate OSA with an AHI of 14.8/hr on auto CPAP   Osteopenia 03/12/2021   Right knee pain    SVT (supraventricular tachycardia) (HCC)    Past Surgical History:  Procedure Laterality Date   ABDOMINAL HYSTERECTOMY     BREAST CYST EXCISION Left    68 years old   COLONOSCOPY  2016   [facility]; 1 tubular adenoma, 1  hyperplastic polyp, TI biopsy with reactive lymphoid hyperplasia without active or chronic enteritis.   COLONOSCOPY N/A 12/02/2021   Procedure: COLONOSCOPY;  Surgeon: Suzette Espy, MD;  Location: [facility];  Service: Endoscopy;  Laterality: N/A;  7:30am   cyst left breast      SALPINGECTOMY Left    TMJ ARTHROPLASTY     Family History  Problem Relation Age of Onset   Diabetes Mother    Heart disease Mother        used nitroglycerin   Hypertension Mother    Thyroid  disease Mother    Pancreatic cancer Mother    Hypertension Father    Diabetes Sister    Hypertension Sister    Colon cancer Sister        35   Hypertension Sister    Colon cancer Sister        10s   Hypertension Brother    Thyroid  disease Brother    Diabetes Brother    Hypertension Brother    COPD Brother    Heart disease Brother    Stroke Brother    COPD Brother    Lung disease Brother    Stroke Maternal Grandmother    Breast cancer Neg Hx    Social History   Socioeconomic History   Marital status: Divorced    Spouse name: Not on file   Number of children: 1   Years of education: 12   Highest education level: 12th grade  Occupational History   Occupation: Retired  Tobacco Use   Smoking status: Never    Passive exposure: Never   Smokeless tobacco: Never  Vaping Use   Vaping status: Never Used  Substance and Sexual Activity   Alcohol use: Never   Drug use: Never   Sexual activity: Not Currently    Partners: Male    Birth control/protection: Surgical  Other Topics Concern   Not on file  Social History Narrative   Lives alone. Has one son - lives in [place].   Social Drivers of Health   Financial Resource Strain: Low Risk  (03/14/2024)   Overall Financial Resource Strain (CARDIA)    Difficulty of Paying Living Expenses: Not hard at all  Food Insecurity: Food Insecurity Present (03/14/2024)   Hunger Vital Sign    Worried About Running Out of Food in the Last Year: Often true    Ran Out of  Food in the Last Year: Often true  Transportation Needs: Unmet Transportation Needs (03/14/2024)   PRAPARE - Administrator, Civil Service (Medical): Yes    Lack of Transportation (Non-Medical): Yes  Physical Activity: Insufficiently Active (03/14/2024)   Exercise Vital Sign    Days of Exercise per Week: 5 days    Minutes of Exercise per Session: 20 min  Stress: No Stress Concern Present (03/14/2024)   [employer] of Occupational Health - Occupational Stress Questionnaire    Feeling  of Stress : Not at all  Social Connections: Moderately Isolated (03/14/2024)   Social Connection and Isolation Panel [NHANES]    Frequency of Communication with Friends and Family: More than three times a week    Frequency of Social Gatherings with Friends and Family: More than three times a week    Attends Religious Services: More than 4 times per year    Active Member of [employer] or Organizations: No    Attends Engineer, structural: Never    Marital Status: Divorced    Tobacco Counseling Counseling given: Not Answered    Clinical Intake:  Pre-visit preparation completed: Yes  Pain : 0-10 Pain Score: 10-Worst pain ever Pain Type: Chronic pain Pain Location: Knee Pain Orientation: Right Pain Descriptors / Indicators: Constant, Stabbing Pain Onset: More than a month ago Pain Frequency: Constant     BMI - recorded: 43.05 Nutritional Status: BMI > 30  Obese Nutritional Risks: None Diabetes: No  Lab Results  Component Value Date   HGBA1C 6.0 (H) 03/01/2024   HGBA1C 6.2 (H) 09/12/2023   HGBA1C 6.2 (H) 05/20/2023     How often do you need to have someone help you when you read instructions, pamphlets, or other written materials from your doctor or pharmacy?: 1 - Never  Interpreter Needed?: No  Information entered by :: Sally Crazier CMA   Activities of Daily Living     03/14/2024    1:36 PM 03/16/2023    3:42 PM  In your present state of health, do you have any  difficulty performing the following activities:  Hearing? 0 0  Vision? 0 0  Comment patient is not up to date on yearly exams. referral placed today   Difficulty concentrating or making decisions? 0 0  Walking or climbing stairs? 1 1  Comment chronic knee pain RT knee Unsteady Balance/Gait  Dressing or bathing? 0 0  Doing errands, shopping? 1 1  Comment patient has no transportation. CRR placed today. Unsteady Balance/Gait  Preparing Food and eating ? N N  Using the Toilet? N N  In the past six months, have you accidently leaked urine? N N  Do you have problems with loss of bowel control? N N  Managing your Medications? N N  Managing your Finances? N N  Housekeeping or managing your Housekeeping? Anselm Kingfisher  Comment  Son Assists    Patient Care Team: Del Amber Bail, Rogerio Clay, [name] as PCP - General (Family Medicine) Mallipeddi, Kennyth Pean, MD as PCP - Cardiology (Cardiology) Riley Cheadle Windsor Hatcher, MD as Consulting Physician (Gastroenterology)  Indicate any recent Medical Services you may have received from other than [facility] providers in the past year (date may be approximate).     Assessment:   This is a routine wellness examination for Felice.  Hearing/Vision screen Hearing Screening - Comments:: Patient denies any hearing difficulties.   Vision Screening - Comments:: Patient is not up to date on yearly eye exams.  Referral to ophthalmology placed.     Goals Addressed             This Visit's Progress    Exercise 3x per week (30 min per time)   On track    Weight (lb) < 200 lb (90.7 kg)   300 lb (136.1 kg)    I've [facility] [employer] and I want to lose weight       Depression Screen     03/14/2024    1:45 PM 02/24/2024    1:42 PM 09/12/2023  8:53 AM 05/13/2023    1:52 PM 05/13/2023    1:20 PM 03/16/2023    3:39 PM 03/09/2023    2:09 PM  PHQ 2/9 Scores  PHQ - 2 Score 1 2 2 2  0 0 0  PHQ- 9 Score 7 8 4 7  0      Fall Risk     03/14/2024    1:40 PM 02/24/2024    1:41 PM 05/13/2023     1:54 PM 05/13/2023    1:20 PM 03/16/2023    3:33 PM  Fall Risk   Falls in the past year? 1 1 1  0 0  Number falls in past yr: 1 1 1  0 0  Injury with Fall? 1 1 0 0 0  Risk for fall due to : History of fall(s);Impaired balance/gait;Orthopedic patient;Impaired mobility Impaired balance/gait Other (Comment) No Fall Risks Impaired balance/gait;Impaired mobility;No Fall Risks  Follow up Falls prevention discussed;Falls evaluation completed;Education provided Follow up appointment Falls evaluation completed;Education provided Falls evaluation completed Falls evaluation completed;Education provided;Falls prevention discussed    MEDICARE RISK AT HOME:  Medicare Risk at Home Any stairs in or around the home?: No If so, are there any without handrails?: No Home free of loose throw rugs in walkways, pet beds, electrical cords, etc?: Yes Adequate lighting in your home to reduce risk of falls?: Yes Life alert?: No Use of a cane, walker or w/c?: Yes Grab bars in the bathroom?: No Shower chair or bench in shower?: No (pt needs a shower bench due to high fall risk, gait instability) Elevated toilet seat or a handicapped toilet?: No (needs an attachment for commode. she has had multiple falls. chronic RT knee pain.Aaron Aas)  TIMED UP AND GO:  Was the test performed?  No  Cognitive Function: 6CIT completed        03/14/2024    1:43 PM 03/09/2023    2:10 PM 06/14/2022   10:34 AM 06/11/2021   11:19 AM  6CIT Screen  What Year? 0 points 0 points 0 points 0 points  What month? 0 points 0 points 0 points 0 points  What time? 0 points 0 points 0 points 0 points  Count back from 20 0 points 0 points 0 points 0 points  Months in reverse 0 points 0 points 0 points 0 points  Repeat phrase 0 points 0 points 0 points 0 points  Total Score 0 points 0 points 0 points 0 points    Immunizations Immunization History  Administered Date(s) Administered   Influenza,inj,Quad PF,6+ Mos 09/06/2017   Influenza-Unspecified  10/14/2015, 08/06/2016, 08/15/2018   Tdap 11/15/2016    Screening Tests Health Maintenance  Topic Date Due   Pneumonia Vaccine 2+ Years old (1 of 1 - PCV) Never done   Zoster Vaccines- Shingrix (1 of 2) Never done   COVID-19 Vaccine (1 - 2024-25 season) Never done   INFLUENZA VACCINE  06/15/2024   MAMMOGRAM  09/15/2024   Medicare Annual Wellness (AWV)  03/14/2025   DEXA SCAN  03/14/2026   DTaP/Tdap/Td (2 - Td or Tdap) 11/15/2026   Colonoscopy  12/02/2026   Hepatitis C Screening  Completed   HPV VACCINES  Aged Out   Meningococcal B Vaccine  Aged Out    Health Maintenance  Health Maintenance Due  Topic Date Due   Pneumonia Vaccine 3+ Years old (1 of 1 - PCV) Never done   Zoster Vaccines- Shingrix (1 of 2) Never done   COVID-19 Vaccine (1 - 2024-25 season) Never done  Health Maintenance Items Addressed: CRR placed. Patient advised of recommended vaccines with verbal understanding.   Additional Screening:  Vision Screening: Recommended annual ophthalmology exams for early detection of glaucoma and other disorders of the eye. Referral to ophthalmology placed.   Dental Screening: Recommended annual dental exams for proper oral hygiene  Community Resource Referral / Chronic Care Management: CRR required this visit?  Yes   CCM required this visit?  No     Plan:     I have personally reviewed and noted the following in the patient's chart:   Medical and social history Use of alcohol, tobacco or illicit drugs  Current medications and supplements including opioid prescriptions. Patient is not currently taking opioid prescriptions. Functional ability and status Nutritional status Physical activity Advanced directives List of other physicians Hospitalizations, surgeries, and ER visits in previous 12 months Vitals Screenings to include cognitive, depression, and falls Referrals and appointments  In addition, I have reviewed and discussed with patient certain  preventive protocols, quality metrics, and best practice recommendations. A written personalized care plan for preventive services as well as general preventive health recommendations were provided to patient.     Abby Julaine Zimny, CMA   03/14/2024   After Visit Summary: (MyChart) Due to this being a telephonic visit, the after visit summary with patients personalized plan was offered to patient via MyChart   Notes: PCP Follow Up Recommendations: patient scheduled to see pcp on 03/15/2024 to follow up on blood pressure. BP has been increasing since cardilogy d/c'ed amlodipine  due to swelling.

## 2024-03-14 NOTE — Patient Instructions (Signed)
 Kristine Mata , Thank you for taking time to come for your Medicare Wellness Visit. I appreciate your ongoing commitment to your health goals. Please review the following plan we discussed and let me know if I can assist you in the future.   Referrals/Orders/Follow-Ups/Clinician Recommendations:    Next Medicare AWV: Mar 19, 2025 at 8:00 am video visit  You've been referred to see Dr. Cindra Cree for an eye exam. If you haven't heard from his office in about a week, please call to schedule your appointment.   Cindra Cree, MD [address] [place] [address]  Phone: [phone]  A referral has been placed for you to check and see what additional resources are available to you.   If you haven't heard from anyone within the next 7 business days, please call them and let them know a referral has been placed  Phone: [phone]      Aim for 30 minutes of exercise or brisk walking, 6-8 glasses of water, and 5 servings of fruits and vegetables each day.   This is a list of the screening recommended for you and due dates:  Health Maintenance  Topic Date Due   Pneumonia Vaccine (1 of 1 - PCV) Never done   Zoster (Shingles) Vaccine (1 of 2) Never done   COVID-19 Vaccine (1 - 2024-25 season) Never done   Flu Shot  06/15/2024   Mammogram  09/15/2024   Medicare Annual Wellness Visit  03/14/2025   DEXA scan (bone density measurement)  03/14/2026   DTaP/Tdap/Td vaccine (2 - Td or Tdap) 11/15/2026   Colon Cancer Screening  12/02/2026   Hepatitis C Screening  Completed   HPV Vaccine  Aged Out   Meningitis B Vaccine  Aged Out    Advanced directives: (Declined) Advance directive discussed with you today. Even though you declined this today, please call our office should you change your mind, and we can give you the proper paperwork for you to fill out. Advance Care Planning is important because it:  [x]  Makes sure you receive the medical care that is consistent with your values, goals, and  preferences  [x]  It provides guidance to your family and loved ones and it also reduces their decisional burden about whether or not they are making the right decisions based on what you want done  Follow the link provided in your after visit summary or read over the paperwork we have mailed to you to help you started getting your Advance Directives in place. If you need assistance in completing these, please reach out to us  so that we can help you!  Next Medicare Annual Wellness Visit scheduled for next year: yes  Understanding Your Risk for Falls Millions of people have serious injuries from falls each year. It is important to understand your risk of falling. Talk with your health care provider about your risk and what you can do to lower it. If you do have a serious fall, make sure to tell your provider. Falling once raises your risk of falling again. How can falls affect me? Serious injuries from falls are common. These include: Broken bones, such as hip fractures. Head injuries, such as traumatic brain injuries (TBI) or concussions. A fear of falling can cause you to avoid activities and stay at home. This can make your muscles weaker and raise your risk for a fall. What can increase my risk? There are a number of risk factors that increase your risk for falling. The more  risk factors you have, the higher your risk of falling. Serious injuries from a fall happen most often to people who are older than 69 years old. Teenagers and young adults ages 56-29 are also at higher risk. Common risk factors include: Weakness in the lower body. Being generally weak or confused due to long-term (chronic) illness. Dizziness or balance problems. Poor vision. Medicines that cause dizziness or drowsiness. These may include: Medicines for your blood pressure, heart, anxiety, insomnia, or swelling (edema). Pain medicines. Muscle relaxants. Other risk factors include: Drinking alcohol. Having had a  fall in the past. Having foot pain or wearing improper footwear. Working at a dangerous job. Having any of the following in your home: Tripping hazards, such as floor clutter or loose rugs. Poor lighting. Pets. Having dementia or memory loss. What actions can I take to lower my risk of falling?  Physical activity Stay physically fit. Do strength and balance exercises. Consider taking a regular class to build strength and balance. Yoga and tai chi are good options. Vision Have your eyes checked every year and your prescription for glasses or contacts updated as needed. Shoes and walking aids Wear non-skid shoes. Wear shoes that have rubber soles and low heels. Do not wear high heels. Do not walk around the house in socks or slippers. Use a cane or walker as told by your provider. Home safety Attach secure railings on both sides of your stairs. Install grab bars for your bathtub, shower, and toilet. Use a non-skid mat in your bathtub or shower. Attach bath mats securely with double-sided, non-slip rug tape. Use good lighting in all rooms. Keep a flashlight near your bed. Make sure there is a clear path from your bed to the bathroom. Use night-lights. Do not use throw rugs. Make sure all carpeting is taped or tacked down securely. Remove all clutter from walkways and stairways, including extension cords. Repair uneven or broken steps and floors. Avoid walking on icy or slippery surfaces. Walk on the grass instead of on icy or slick sidewalks. Use ice melter to get rid of ice on walkways in the winter. Use a cordless phone. Questions to ask your health care provider Can you help me check my risk for a fall? Do any of my medicines make me more likely to fall? Should I take a vitamin D  supplement? What exercises can I do to improve my strength and balance? Should I make an appointment to have my vision checked? Do I need a bone density test to check for weak bones (osteoporosis)? Would  it help to use a cane or a walker? Where to find more information Centers for Disease Control and Prevention, STEADI: TonerPromos.no Community-Based Fall Prevention Programs: TonerPromos.no [employer] on Aging: BaseRingTones.pl Contact a health care provider if: You fall at home. You are afraid of falling at home. You feel weak, drowsy, or dizzy. This information is not intended to replace advice given to you by your health care provider. Make sure you discuss any questions you have with your health care provider. Document Revised: 07/05/2022 Document Reviewed: 07/05/2022 Elsevier Patient Education  2024 [employer].

## 2024-03-15 ENCOUNTER — Ambulatory Visit: Admitting: Family Medicine

## 2024-03-15 ENCOUNTER — Telehealth: Payer: Self-pay | Admitting: *Deleted

## 2024-03-15 ENCOUNTER — Encounter: Payer: Self-pay | Admitting: Family Medicine

## 2024-03-15 VITALS — BP 119/72 | HR 70 | Ht 70.0 in | Wt 303.1 lb

## 2024-03-15 DIAGNOSIS — M542 Cervicalgia: Secondary | ICD-10-CM | POA: Diagnosis not present

## 2024-03-15 DIAGNOSIS — G8929 Other chronic pain: Secondary | ICD-10-CM

## 2024-03-15 DIAGNOSIS — M25512 Pain in left shoulder: Secondary | ICD-10-CM

## 2024-03-15 MED ORDER — CLONIDINE HCL 0.1 MG PO TABS: 0.1000 mg | ORAL_TABLET | Freq: Two times a day (BID) | ORAL | 1 refills | Status: DC

## 2024-03-15 MED ORDER — TIZANIDINE HCL 4 MG PO TABS
4.0000 mg | ORAL_TABLET | Freq: Two times a day (BID) | ORAL | 2 refills | Status: DC | PRN
Start: 2024-03-15 — End: 2024-08-08

## 2024-03-15 NOTE — Assessment & Plan Note (Signed)
 Xray ordered Zanaflex  4 mg PRN I explained to the patient that non-pharmacological interventions include the application of ice or heat, rest, and recommended range of motion exercises along with gentle stretching. For pain management, Tylenol was advised. The patient was instructed to follow up if symptoms worsen or persist. The patient verbalized understanding of the care plan, and all questions were answered.

## 2024-03-15 NOTE — Progress Notes (Signed)
 Complex Care Management Note  Care Guide Note 03/15/2024 Name: ZANIJAH HALFMANN MRN: [id number] DOB: 02-06-1956  NATALEAH JAGOW is a 68 y.o. year old female who sees Del Amber Bail, Rogerio Clay, [name] for primary care. I reached out to Loy Ruff by phone today to offer complex care management services.  Ms. Flippin was given information about Complex Care Management services today including:   The Complex Care Management services include support from the care team which includes your Nurse Care Manager, Clinical Social Worker, or Pharmacist.  The Complex Care Management team is here to help remove barriers to the health concerns and goals most important to you. Complex Care Management services are voluntary, and the patient may decline or stop services at any time by request to their care team member.   Complex Care Management Consent Status: Patient agreed to services and verbal consent obtained.   Follow up plan:  Telephone appointment with complex care management team member scheduled for:  03/19/2024  Encounter Outcome:  Patient Scheduled  Kandis Ormond, CMA [place]  [facility], [facility] Guide Direct Dial: [phone]  Fax: [phone] Website: [email or web address]

## 2024-03-15 NOTE — Patient Instructions (Signed)

## 2024-03-15 NOTE — Progress Notes (Signed)
 Established Patient Office Visit   Subjective  Patient ID: Kristine Mata, female    DOB: Nov 29, 1955  Age: 68 y.o. MRN: [id number]  Chief Complaint  Patient presents with   Medical Management of Chronic Issues    Shooting Pain in left thumb  that is radiating up arm into left shoulder x 3 weeks.  blood pressure follow up/ amlodipine  d/c by cardiology due to bilateral LLE. cardiologist is out of the office this week.      She  has a past medical history of Ectopic pregnancy, Heart murmur, Hyperlipidemia, Hypertension, Insomnia, unspecified, OSA on CPAP, Osteopenia (03/12/2021), Right knee pain, and SVT (supraventricular tachycardia) (HCC).  Shoulder Pain and neck pain  The patient reports persistent pain in the neck and left shoulder, which has been a chronic issue. A history of trauma is noted. The pain is continuous and has progressively worsened over time. The sensation is described as aching, with a severity rating of 6/10. Associated symptoms include numbness, stiffness, and tingling. The pain is exacerbated by physical activity. Despite the use of acetaminophen and NSAIDs, the patient has not experienced any relief from these treatments.    Review of Systems  Constitutional:  Negative for chills and fever.  Respiratory:  Negative for shortness of breath.   Cardiovascular:  Negative for chest pain.  Genitourinary:  Negative for dysuria.  Musculoskeletal:  Positive for myalgias and neck pain.  Neurological:  Positive for tingling.      Objective:     BP 119/72   Pulse 70   Ht 5\' 10"  (1.778 m)   Wt (!) 303 lb 1.9 oz (137.5 kg)   SpO2 94%   BMI 43.49 kg/m  BP Readings from Last 3 Encounters:  03/15/24 119/72  03/14/24 (!) 148/80  03/05/24 118/86      Physical Exam Vitals reviewed.  Constitutional:      General: She is not in acute distress.    Appearance: Normal appearance. She is not ill-appearing, toxic-appearing or diaphoretic.  HENT:     Head:  Normocephalic.  Eyes:     General:        Right eye: No discharge.        Left eye: No discharge.     Conjunctiva/sclera: Conjunctivae normal.  Cardiovascular:     Rate and Rhythm: Normal rate.     Pulses: Normal pulses.     Heart sounds: Normal heart sounds.  Pulmonary:     Effort: Pulmonary effort is normal. No respiratory distress.     Breath sounds: Normal breath sounds.  Musculoskeletal:     Right shoulder: Normal range of motion.     Left shoulder: Tenderness present. Decreased range of motion.     Cervical back: Normal range of motion. No erythema. No pain with movement. Normal range of motion.  Skin:    General: Skin is warm and dry.     Capillary Refill: Capillary refill takes less than 2 seconds.  Neurological:     General: No focal deficit present.     Mental Status: She is alert and oriented to person, place, and time.     Coordination: Coordination normal.     Gait: Gait normal.  Psychiatric:        Mood and Affect: Mood normal.        Behavior: Behavior normal.      No results found for any visits on 03/15/24.  The 10-year ASCVD risk score (Arnett DK, et al., 2019) is: 8.8%  Assessment & Plan:  Neck pain Assessment & Plan: Xray ordered Zanaflex  4 mg PRN I explained to the patient that non-pharmacological interventions include the application of ice or heat, rest, and recommended range of motion exercises along with gentle stretching. For pain management, Tylenol was advised. The patient was instructed to follow up if symptoms worsen or persist. The patient verbalized understanding of the care plan, and all questions were answered.   Orders: -     DG Cervical Spine Complete; Future  Chronic left shoulder pain -     DG Shoulder Left; Future -     tiZANidine  HCl; Take 1 tablet (4 mg total) by mouth 2 (two) times daily as needed for muscle spasms.  Dispense: 60 tablet; Refill: 2  Other orders -     cloNIDine  HCl; Take 1 tablet (0.1 mg total) by mouth 2  (two) times daily.  Dispense: 60 tablet; Refill: 1    Return if symptoms worsen or fail to improve.   Avelino Lek Amber Bail, [name]

## 2024-03-16 ENCOUNTER — Telehealth: Payer: Self-pay | Admitting: *Deleted

## 2024-03-16 ENCOUNTER — Encounter: Payer: Self-pay | Admitting: Radiology

## 2024-03-16 NOTE — Progress Notes (Signed)
 Complex Care Management Note Care Guide Note  03/16/2024 Name: Kristine Mata MRN: [id number] DOB: January 26, 1956  Kristine Mata is a 68 y.o. year old female who is a primary care patient of Del Abron Abt, [name] . The community resource team was consulted for assistance with Transportation Needs  and Food Insecurity  SDOH screenings and interventions completed:  Yes     SDOH Interventions Today    Flowsheet Row Most Recent Value  SDOH Interventions   Food Insecurity Interventions Community Resources Provided  [employer] mail food bank  list]  Transportation Interventions SCAT (Specialized Community Area Transporation)  [Community transportation is the only available resource for the patient but they will not go to [employer] as they only provide it with in the county]        Care guide performed the following interventions: Patient provided with information about care guide support team and interviewed to confirm resource needs.  Follow Up Plan:  No further follow up planned at this time. The patient has been provided with needed resources.  Encounter Outcome:  Patient Visit Completed Kristine Mata  [facility] HealthPopulation Health Care Guide  Direct [phone] [phone] Website: [email or web address]

## 2024-03-16 NOTE — Progress Notes (Signed)
 Complex Care Management Note Care Guide Note  03/16/2024 Name: Kristine Mata MRN: [id number] DOB: 1956/07/20   Complex Care Management Outreach Attempts: An unsuccessful telephone outreach was attempted today to offer the patient information about available complex care management services.  Follow Up Plan:  Additional outreach attempts will be made to offer the patient complex care management information and services.   Encounter Outcome:  No Answer  [facility] HealthPopulation Health Care Guide  Direct Dial:[phone] Fax:[phone] Website: [email or web address]

## 2024-03-19 ENCOUNTER — Encounter: Payer: Self-pay | Admitting: *Deleted

## 2024-03-19 ENCOUNTER — Telehealth: Payer: Self-pay | Admitting: *Deleted

## 2024-03-19 NOTE — Telephone Encounter (Signed)
 Fax came for patient stating the ONO was cancelled because they were unable to schedule the test.

## 2024-03-20 NOTE — Telephone Encounter (Signed)
 Per Mallipeddi, cancel previous order to add chlorthalidone and says patient can follow with PCP to manage BP.

## 2024-04-06 ENCOUNTER — Other Ambulatory Visit: Payer: Self-pay | Admitting: Internal Medicine

## 2024-04-06 ENCOUNTER — Other Ambulatory Visit: Payer: Self-pay | Admitting: Family Medicine

## 2024-04-06 DIAGNOSIS — E782 Mixed hyperlipidemia: Secondary | ICD-10-CM

## 2024-04-06 MED ORDER — CARVEDILOL 12.5 MG PO TABS: 12.5000 mg | ORAL_TABLET | Freq: Two times a day (BID) | ORAL | 5 refills | Status: DC

## 2024-04-06 MED ORDER — LOVASTATIN 40 MG PO TABS: 40.0000 mg | ORAL_TABLET | Freq: Every day | ORAL | 1 refills | Status: AC

## 2024-04-10 NOTE — Telephone Encounter (Signed)
 Chart noted in previous encounter.

## 2024-04-12 ENCOUNTER — Telehealth: Payer: Self-pay

## 2024-04-12 NOTE — Telephone Encounter (Signed)
 Please let patient know to discontinue medication and follow up with cardiology for next steps

## 2024-04-12 NOTE — Telephone Encounter (Signed)
 I have done all I can to assist in managing her blood pressure. The patient should follow up with her cardiology specialist for further evaluation and management, as this falls within their scope of care.

## 2024-04-12 NOTE — Telephone Encounter (Signed)
 Copied from CRM [id number]. Topic: General - Other >> Apr 12, 2024 12:30 PM Felizardo Hotter wrote: Reason for CRM: Pt called stated cloNIDine  (CATAPRES ) 0.1 MG tablet is causing her to have tinling on left thumb. Please call pt at [phone].

## 2024-04-12 NOTE — Progress Notes (Signed)
 Complex Care Management Care Guide Note  04/12/2024 Name: JAMECIA LERMAN MRN: [id number] DOB: 09/20/1956  TAMMERA ENGERT is a 68 y.o. year old female who is a primary care patient of Del Abron Abt, [name] and is actively engaged with the care management team. I reached out to Loy Ruff by phone today to assist with re-scheduling  with the RN Case Manager.  Follow up plan: Patient declined rescheduling    Lenton Rail , RMA     [facility]  [facility], [facility] Guide  Direct Dial: [phone]  Website: [email or web address]

## 2024-04-13 ENCOUNTER — Other Ambulatory Visit: Payer: Self-pay | Admitting: Family Medicine

## 2024-04-13 ENCOUNTER — Telehealth: Payer: Self-pay | Admitting: Internal Medicine

## 2024-04-13 ENCOUNTER — Telehealth: Payer: Self-pay | Admitting: Family Medicine

## 2024-04-13 NOTE — Telephone Encounter (Signed)
 The cardiologist is telling her to have her PCP manage her BP. Could a message be sent to cardiologist letting him know you are wanting him to manage since he is redirecting her back to you for management.

## 2024-04-13 NOTE — Telephone Encounter (Signed)
 I will senf a message to Dr. Landy Pinna

## 2024-04-13 NOTE — Telephone Encounter (Signed)
 Kristine Mata

## 2024-04-13 NOTE — Telephone Encounter (Signed)
 Patient is requesting to switch providers from Dr. Mallipeddi to Dr. Emmette Harms. Please advise.  Thank you

## 2024-04-24 ENCOUNTER — Encounter: Payer: Self-pay | Admitting: Cardiology

## 2024-04-24 ENCOUNTER — Ambulatory Visit: Attending: Cardiology | Admitting: Cardiology

## 2024-04-24 VITALS — BP 147/86 | HR 60 | Ht 70.0 in | Wt 302.0 lb

## 2024-04-24 DIAGNOSIS — I471 Supraventricular tachycardia, unspecified: Secondary | ICD-10-CM

## 2024-04-24 DIAGNOSIS — R5383 Other fatigue: Secondary | ICD-10-CM

## 2024-04-24 DIAGNOSIS — I10 Essential (primary) hypertension: Secondary | ICD-10-CM

## 2024-04-24 DIAGNOSIS — R079 Chest pain, unspecified: Secondary | ICD-10-CM | POA: Diagnosis not present

## 2024-04-24 DIAGNOSIS — Z01812 Encounter for preprocedural laboratory examination: Secondary | ICD-10-CM

## 2024-04-24 DIAGNOSIS — E669 Obesity, unspecified: Secondary | ICD-10-CM

## 2024-04-24 MED ORDER — METOPROLOL TARTRATE 50 MG PO TABS: 50.0000 mg | ORAL_TABLET | Freq: Once | ORAL | 0 refills | Status: DC

## 2024-04-24 MED ORDER — FLECAINIDE ACETATE 50 MG PO TABS: 50.0000 mg | ORAL_TABLET | Freq: Two times a day (BID) | ORAL | 3 refills | Status: DC

## 2024-04-24 NOTE — Patient Instructions (Addendum)
 Medication Instructions:  - START FLECANIDE 50 MG TWICE A DAY    *If you need a refill on your cardiac medications before your next appointment, please call your pharmacy*   Lab Work: Please complete a BMET in our lab on the first floor before you leave today. You do not need to be fasting.   If you have labs (blood work) drawn today and your tests are completely normal, you will receive your results only by: MyChart Message (if you have MyChart) OR A paper copy in the mail If you have any lab test that is abnormal or we need to change your treatment, we will call you to review the results.   Testing/Procedures:   Your cardiac CT will be scheduled at one of the below locations:   [facility] [address] [place], [place] [address] [phone]  OR  Jeralene Mom. [facility] and Vascular Tower [address]  [place], [place] [address] Opening March 12, 2024  If scheduled at [facility], please arrive at the [facility] and Children's Entrance (Entrance C2) of [facility] 30 minutes prior to test start time. You can use the FREE valet parking offered at entrance C (encouraged to control the heart rate for the test)  Proceed to the [facility] Radiology Department (first floor) to check-in and test prep.   All radiology patients and guests should use entrance C2 at [facility], accessed from [facility], even though the hospital's physical address listed is [address]    If scheduled at the Heart and Vascular Tower at [address], please enter the parking lot using the [address] entrance and use the FREE valet service at the patient drop-off area. Enter the buidling and check-in with registration on the main floor.   Please follow these instructions carefully (unless otherwise directed):  An IV will be required for this test and Nitroglycerin will be given.  Hold all erectile dysfunction medications at  least 3 days (72 hrs) prior to test. (Ie viagra, cialis, sildenafil, tadalafil, etc)   On the Night Before the Test: Be sure to Drink plenty of water. Do not consume any caffeinated/decaffeinated beverages or chocolate 12 hours prior to your test. Do not take any antihistamines SUCH AS CLARITIN  12 hours prior to your test.  On the Day of the Test: Drink plenty of water until 1 hour prior to the test. Do not eat any food 1 hour prior to test. You may take your regular medications prior to the test.  Take metoprolol  (Lopressor ) two hours prior to test. You may hold your morning dose of carvedilol  and take this instead.  If you take Furosemide /Hydrochlorothiazide/Spironolactone/Chlorthalidone, please HOLD on the morning of the test. FEMALES- please wear underwire-free bra if available, avoid dresses & tight clothing       After the Test: Drink plenty of water. After receiving IV contrast, you may experience a mild flushed feeling. This is normal. On occasion, you may experience a mild rash up to 24 hours after the test. This is not dangerous. If this occurs, you can take Benadryl 25 mg, Zyrtec , Claritin , or Allegra and increase your fluid intake. (Patients taking Tikosyn should avoid Benadryl, and may take Zyrtec , Claritin , or Allegra) If you experience trouble breathing, this can be serious. If it is severe call 911 IMMEDIATELY. If it is mild, please call our office.  We will call to schedule your test 2-4 weeks out understanding that some insurance companies  will need an authorization prior to the service being performed.   For more information and frequently asked questions, please visit our website : [email or web address]  For non-scheduling related questions, please contact the cardiac imaging nurse navigator should you have any questions/concerns: Cardiac Imaging Nurse Navigators Direct Office Dial: [phone]   For scheduling needs, including cancellations and  rescheduling, please call [place], [phone].    Follow-Up: At [facility], you and your health needs are our priority.  As part of our continuing mission to provide you with exceptional heart care, we have created designated Provider Care Teams.  These Care Teams include your primary Cardiologist (physician) and Advanced Practice Providers (APPs -  Physician Assistants and Nurse Practitioners) who all work together to provide you with the care you need, when you need it.  We recommend signing up for the patient portal called "MyChart".  Sign up information is provided on this After Visit Summary.  MyChart is used to connect with patients for Virtual Visits (Telemedicine).  Patients are able to view lab/test results, encounter notes, upcoming appointments, etc.  Non-urgent messages can be sent to your provider as well.   To learn more about what you can do with MyChart, go to [email or web address].    Your next appointment:   16 week(s)  The format for your next appointment:   In Person  Provider:   Kardie Tobb, MD    Other Instructions  DR. TOBB HAS REFERRED YOU TO OUR PHARMACY TEAM FOR BLOOD PRESSURE AND WEIGHT LOSS MANAGEMENT.  PLEASE CHECK YOUR BLOOD PRESSURE DAILY FOR THE NEXT TWO WEEK  AND SEND THE READINGS IN VIA MYCHART.

## 2024-04-24 NOTE — Progress Notes (Addendum)
 Cardiology Office Note:    Date:  04/27/2024   ID:  Kristine Mata, DOB 16-Mar-1956, MRN [id number]  PCP:  Terry Wilhelmena [name], [name]  Cardiologist:  Diannah SHAUNNA Maywood, MD  Electrophysiologist:  None   Referring MD: Del Orbe Polanco, Ilian*    I have been having chest pain  History of Present Illness:    Kristine Mata is a 68 y.o. female with a hx of Hypertension, Hyperlipidemia, PSVT, 5 beats of NSVT noted in the overnight hours on a zio monitor, OSA usesCPA    She has seen Dr, Mallipeddi and was last seen on 02/23/2024. During that visit she was started on Amlodipine  2.5 mg daily. She had reported intolerance to Cardizem . Coreg  was continued.   Of note she has had to issues with her medications - transitioned from metorprolol to coreg  due to need for better blood pressure control and asymptomatic second degree AV block.   Today she reports that she has been experiencing  persistent heart palpitations, primarily at night around 2 to 3 AM, with a heart rate recorded up to 193 beats per minute at 5 AM. These symptoms have been intermittent since 2019.  She has hypertension and has been on medications including metoprolol , losartan , carvedilol , amlodipine , and clonidine . Amlodipine  caused significant peripheral edema, and clonidine  was associated with a sensation of 'electricity' in her arm. She is currently on carvedilol  12.5 mg twice daily and has recently was told to hold the clonidine .  Her blood pressure at home typically ranges from 130-140/80. She leads a sedentary lifestyle due to a knee injury, has gained weight since retiring, and is considering increasing her physical activity. No shortness of breath or fatigue, though she feels more tired recently, attributing it to poor sleep.  Past Medical History:  Diagnosis Date   Ectopic pregnancy    Heart murmur    Hyperlipidemia    Hypertension    Insomnia, unspecified    OSA on CPAP    moderate OSA with an AHI of  14.8/hr on auto CPAP   Osteopenia 03/12/2021   Right knee pain    SVT (supraventricular tachycardia) (HCC)     Past Surgical History:  Procedure Laterality Date   ABDOMINAL HYSTERECTOMY     BREAST CYST EXCISION Left    68 years old   COLONOSCOPY  2016   [facility]; 1 tubular adenoma, 1 hyperplastic polyp, TI biopsy with reactive lymphoid hyperplasia without active or chronic enteritis.   COLONOSCOPY N/A 12/02/2021   Procedure: COLONOSCOPY;  Surgeon: Shaaron Lamar HERO, MD;  Location: [facility];  Service: Endoscopy;  Laterality: N/A;  7:30am   cyst left breast      SALPINGECTOMY Left    TMJ ARTHROPLASTY      Current Medications: Current Meds  Medication Sig   albuterol  (VENTOLIN  HFA) 108 (90 Base) MCG/ACT inhaler Inhale 2 puffs into the lungs every 6 (six) hours as needed for wheezing or shortness of breath.   carvedilol  (COREG ) 12.5 MG tablet Take 1 tablet (12.5 mg total) by mouth 2 (two) times daily with a meal.   cholecalciferol (VITAMIN D3) 25 MCG (1000 UNIT) tablet Take 1,000 Units by mouth daily.   cloNIDine  (CATAPRES ) 0.1 MG tablet Take 1 tablet (0.1 mg total) by mouth 2 (two) times daily.   flecainide  (TAMBOCOR ) 50 MG tablet Take 1 tablet (50 mg total) by mouth 2 (two) times daily.   fluticasone  (FLONASE ) 50 MCG/ACT nasal spray Place 2 sprays into both nostrils daily.  furosemide  (LASIX ) 80 MG tablet Take 1 tablet (80 mg total) by mouth daily.   loratadine  (CLARITIN ) 10 MG tablet Take 1 tablet (10 mg total) by mouth daily.   lovastatin  (MEVACOR ) 40 MG tablet Take 1 tablet (40 mg total) by mouth at bedtime.   metoprolol  tartrate (LOPRESSOR ) 50 MG tablet Take 1 tablet (50 mg total) by mouth once for 1 dose. Take 90-120 minutes prior to scan. Hold for SBP less than 110.   Multiple Vitamins-Minerals (MULTIVITAMIN WITH MINERALS) tablet Take 1 tablet by mouth every other day.   traZODone  (DESYREL ) 50 MG tablet Take 1 tablet (50 mg total) by mouth at bedtime as needed.     Allergies:    Diltiazem , Anesthesia s-i-40 [propofol], Codeine, Olmesartan , Naproxen , Norvasc  [amlodipine ], and Tamiflu  [oseltamivir ]   Social History   Socioeconomic History   Marital status: Divorced    Spouse name: Not on file   Number of children: 1   Years of education: 12   Highest education level: 12th grade  Occupational History   Occupation: Retired  Tobacco Use   Smoking status: Never    Passive exposure: Never   Smokeless tobacco: Never  Vaping Use   Vaping status: Never Used  Substance and Sexual Activity   Alcohol use: Never   Drug use: Never   Sexual activity: Not Currently    Partners: Male    Birth control/protection: Surgical  Other Topics Concern   Not on file  Social History Narrative   Lives alone. Has one son - lives in [place].   Social Drivers of Health   Financial Resource Strain: Low Risk  (03/14/2024)   Overall Financial Resource Strain (CARDIA)    Difficulty of Paying Living Expenses: Not hard at all  Food Insecurity: Food Insecurity Present (03/14/2024)   Hunger Vital Sign    Worried About Running Out of Food in the Last Year: Often true    Ran Out of Food in the Last Year: Often true  Transportation Needs: Unmet Transportation Needs (03/14/2024)   PRAPARE - Administrator, Civil Service (Medical): Yes    Lack of Transportation (Non-Medical): Yes  Physical Activity: Insufficiently Active (03/14/2024)   Exercise Vital Sign    Days of Exercise per Week: 5 days    Minutes of Exercise per Session: 20 min  Stress: No Stress Concern Present (03/14/2024)   [employer] of Occupational Health - Occupational Stress Questionnaire    Feeling of Stress : Not at all  Social Connections: Moderately Isolated (03/14/2024)   Social Connection and Isolation Panel    Frequency of Communication with Friends and Family: More than three times a week    Frequency of Social Gatherings with Friends and Family: More than three times a week    Attends Religious  Services: More than 4 times per year    Active Member of [employer] or Organizations: No    Attends Engineer, structural: Never    Marital Status: Divorced     Family History: The patient's family history includes COPD in her brother and brother; Colon cancer in her sister and sister; Diabetes in her brother, mother, and sister; Heart disease in her brother and mother; Hypertension in her brother, brother, father, mother, sister, and sister; Lung disease in her brother; Pancreatic cancer in her mother; Stroke in her brother and maternal grandmother; Thyroid  disease in her brother and mother. There is no history of Breast cancer.  ROS:   Review of Systems  Constitution: Negative for  decreased appetite, fever and weight gain.  HENT: Negative for congestion, ear discharge, hoarse voice and sore throat.   Eyes: Negative for discharge, redness, vision loss in right eye and visual halos.  Cardiovascular: Negative for chest pain, dyspnea on exertion, leg swelling, orthopnea and palpitations.  Respiratory: Negative for cough, hemoptysis, shortness of breath and snoring.   Endocrine: Negative for heat intolerance and polyphagia.  Hematologic/Lymphatic: Negative for bleeding problem. Does not bruise/bleed easily.  Skin: Negative for flushing, nail changes, rash and suspicious lesions.  Musculoskeletal: Negative for arthritis, joint pain, muscle cramps, myalgias, neck pain and stiffness.  Gastrointestinal: Negative for abdominal pain, bowel incontinence, diarrhea and excessive appetite.  Genitourinary: Negative for decreased libido, genital sores and incomplete emptying.  Neurological: Negative for brief paralysis, focal weakness, headaches and loss of balance.  Psychiatric/Behavioral: Negative for altered mental status, depression and suicidal ideas.  Allergic/Immunologic: Negative for HIV exposure and persistent infections.    EKGs/Labs/Other Studies Reviewed:    The following studies were  reviewed today:   EKG:  The ekg ordered today demonstrates   Recent Labs: 06/20/2023: Magnesium 2.5 03/01/2024: ALT 13; TSH 0.633 04/24/2024: BUN 10; Creatinine, Ser 0.54; Hemoglobin 12.0; Platelets 308; Potassium 4.3; Sodium 141  Recent Lipid Panel    Component Value Date/Time   CHOL 174 03/01/2024 1026   TRIG 93 03/01/2024 1026   HDL 73 03/01/2024 1026   CHOLHDL 2.4 03/01/2024 1026   LDLCALC 84 03/01/2024 1026    Physical Exam:    VS:  BP (!) 147/86 (BP Location: Left Arm, Patient Position: Sitting, Cuff Size: Large)   Pulse 60   Ht 5' 10 (1.778 m)   Wt (!) 302 lb (137 kg)   SpO2 93%   BMI 43.33 kg/m     Wt Readings from Last 3 Encounters:  04/24/24 (!) 302 lb (137 kg)  03/15/24 (!) 303 lb 1.9 oz (137.5 kg)  03/14/24 300 lb (136.1 kg)     GEN: Well nourished, well developed in no acute distress HEENT: Normal NECK: No JVD; No carotid bruits LYMPHATICS: No lymphadenopathy CARDIAC: S1S2 noted,RRR, no murmurs, rubs, gallops RESPIRATORY:  Clear to auscultation without rales, wheezing or rhonchi  ABDOMEN: Soft, non-tender, non-distended, +bowel sounds, no guarding. EXTREMITIES: No edema, No cyanosis, no clubbing MUSCULOSKELETAL:  No deformity  SKIN: Warm and dry NEUROLOGIC:  Alert and oriented x 3, non-focal PSYCHIATRIC:  Normal affect, good insight  ASSESSMENT:    1. Primary hypertension   2. Chest pain, unspecified type   3. PSVT (paroxysmal supraventricular tachycardia) (HCC)   4. Other fatigue   5. Pre-procedure lab exam   6. Obesity with serious comorbidity, unspecified class, unspecified obesity type    PLAN:    Paroxysmal Supraventricular Tachycardia (PSVT)/ Noted beats of NSVT on zio monitor  Intermittent palpitations with heart rates up to 193 bpm. Previous beta blocker therapy caused pauses. Current symptoms align with NSVT  timing on heart monitor. -  has not tolerate cardizem  and metoprolol , Initiate flecainide  twice daily. - Continue  carvedilol .  Intermittent fatigue - need to rule our CAD especillay with risk factors and symptoms then now in the presence of flecainide  use.   Intermittent chest discomfort with her risk factors.  Will order CCTA.  The symptoms chest pain is concerning, this patient does have intermediate risk for coronary artery disease and at this time I would like to pursue an ischemic evaluation in this patient.  Shared decision a coronary CTA at this time is appropriate.  I have  discussed with the patient about the testing.  The patient has no IV contrast allergy and is agreeable to proceed with this test.  - Schedule follow-up EKG in 16 weeks.  Hypertension Blood pressure 130-140/80 mmHg. Clonidine  effective without worsening palpitations. - Resume clonidine  0.1 mg twice daily. - Monitor blood pressure daily and record in MyChart after two weeks. - Consult pharmacist for medication options aiding sleep apnea and blood pressure control.  Prediabetes Aware of diagnosis and attempting lifestyle modifications.  Obesity Weight gain post-retirement, motivated to increase physical activity. - Encourage water aerobics and low-impact exercises.  Vitamin D  deficiency Low vitamin D  levels noted in April. - Resume vitamin D  supplementation.  In terms of her OSA- I will refer her to our pharmacy team for consideration for Zepbound.   The patient is in agreement with the above plan. The patient left the office in stable condition.  The patient will follow up in   Medication Adjustments/Labs and Tests Ordered: Current medicines are reviewed at length with the patient today.  Concerns regarding medicines are outlined above.  Orders Placed This Encounter  Procedures   CT CORONARY MORPH W/CTA COR W/SCORE W/CA W/CM &/OR WO/CM   Basic Metabolic Panel (BMET)   CBC   AMB Referral to [facility] Pharm-D   Meds ordered this encounter  Medications   flecainide  (TAMBOCOR ) 50 MG tablet    Sig: Take 1 tablet (50  mg total) by mouth 2 (two) times daily.    Dispense:  180 tablet    Refill:  3   metoprolol  tartrate (LOPRESSOR ) 50 MG tablet    Sig: Take 1 tablet (50 mg total) by mouth once for 1 dose. Take 90-120 minutes prior to scan. Hold for SBP less than 110.    Dispense:  1 tablet    Refill:  0    Patient Instructions  Medication Instructions:  - START FLECANIDE 50 MG TWICE A DAY    *If you need a refill on your cardiac medications before your next appointment, please call your pharmacy*   Lab Work: Please complete a BMET in our lab on the first floor before you leave today. You do not need to be fasting.   If you have labs (blood work) drawn today and your tests are completely normal, you will receive your results only by: MyChart Message (if you have MyChart) OR A paper copy in the mail If you have any lab test that is abnormal or we need to change your treatment, we will call you to review the results.   Testing/Procedures:   Your cardiac CT will be scheduled at one of the below locations:   [facility] [address] [place], [place] [address] [phone]  OR  [facility] and Vascular Tower [address]  [place], [place] [address] Opening March 12, 2024  If scheduled at [facility], please arrive at the [facility] and Children's Entrance (Entrance C2) of [facility] 30 minutes prior to test start time. You can use the FREE valet parking offered at entrance C (encouraged to control the heart rate for the test)  Proceed to the [facility] Radiology Department (first floor) to check-in and test prep.   All radiology patients and guests should use entrance C2 at [facility], accessed from [facility], even though the hospital's physical address listed is [address]    If scheduled at the Heart and Vascular Tower at [address], please enter the  parking lot using the [address] entrance and use  the FREE valet service at the patient drop-off area. Enter the buidling and check-in with registration on the main floor.   Please follow these instructions carefully (unless otherwise directed):  An IV will be required for this test and Nitroglycerin  will be given.  Hold all erectile dysfunction medications at least 3 days (72 hrs) prior to test. (Ie viagra, cialis, sildenafil, tadalafil, etc)   On the Night Before the Test: Be sure to Drink plenty of water. Do not consume any caffeinated/decaffeinated beverages or chocolate 12 hours prior to your test. Do not take any antihistamines SUCH AS CLARITIN  12 hours prior to your test.  On the Day of the Test: Drink plenty of water until 1 hour prior to the test. Do not eat any food 1 hour prior to test. You may take your regular medications prior to the test.  Take metoprolol  (Lopressor ) two hours prior to test. You may hold your morning dose of carvedilol  and take this instead.  If you take Furosemide /Hydrochlorothiazide/Spironolactone/Chlorthalidone, please HOLD on the morning of the test. FEMALES- please wear underwire-free bra if available, avoid dresses & tight clothing       After the Test: Drink plenty of water. After receiving IV contrast, you may experience a mild flushed feeling. This is normal. On occasion, you may experience a mild rash up to 24 hours after the test. This is not dangerous. If this occurs, you can take Benadryl 25 mg, Zyrtec , Claritin , or Allegra and increase your fluid intake. (Patients taking Tikosyn should avoid Benadryl, and may take Zyrtec , Claritin , or Allegra) If you experience trouble breathing, this can be serious. If it is severe call 911 IMMEDIATELY. If it is mild, please call our office.  We will call to schedule your test 2-4 weeks out understanding that some insurance companies will need an authorization prior to the service being performed.   For more information and frequently asked questions,  please visit our website : [email or web address]  For non-scheduling related questions, please contact the cardiac imaging nurse navigator should you have any questions/concerns: Cardiac Imaging Nurse Navigators Direct Office Dial: [phone]   For scheduling needs, including cancellations and rescheduling, please call [place], [phone].    Follow-Up: At [facility], you and your health needs are our priority.  As part of our continuing mission to provide you with exceptional heart care, we have created designated Provider Care Teams.  These Care Teams include your primary Cardiologist (physician) and Advanced Practice Providers (APPs -  Physician Assistants and Nurse Practitioners) who all work together to provide you with the care you need, when you need it.  We recommend signing up for the patient portal called MyChart.  Sign up information is provided on this After Visit Summary.  MyChart is used to connect with patients for Virtual Visits (Telemedicine).  Patients are able to view lab/test results, encounter notes, upcoming appointments, etc.  Non-urgent messages can be sent to your provider as well.   To learn more about what you can do with MyChart, go to [email or web address].    Your next appointment:   16 week(s)  The format for your next appointment:   In Person  Provider:   Virat Prather, MD    Other Instructions  DR. Adira Limburg HAS REFERRED YOU TO OUR PHARMACY TEAM FOR BLOOD PRESSURE AND WEIGHT LOSS MANAGEMENT.  PLEASE CHECK YOUR BLOOD PRESSURE DAILY FOR THE NEXT TWO WEEK  AND SEND THE READINGS IN VIA MYCHART.  Adopting a Healthy Lifestyle.  Know what a healthy weight is for you (roughly BMI <25) and aim to maintain this   Aim for 7+ servings of fruits and vegetables daily   65-80+ fluid ounces of water or unsweet tea for healthy kidneys   Limit to max 1 drink of alcohol per day; avoid smoking/tobacco   Limit animal fats in diet for cholesterol  and heart health - choose grass fed whenever available   Avoid highly processed foods, and foods high in saturated/trans fats   Aim for low stress - take time to unwind and care for your mental health   Aim for 150 min of moderate intensity exercise weekly for heart health, and weights twice weekly for bone health   Aim for 7-9 hours of sleep daily   When it comes to diets, agreement about the perfect plan isnt easy to find, even among the experts. Experts at the [facility] [employer] developed an idea known as the Healthy Eating Plate. Just imagine a plate divided into logical, healthy portions.   The emphasis is on diet quality:   Load up on vegetables and fruits - one-half of your plate: Aim for color and variety, and remember that potatoes dont count.   Go for whole grains - one-quarter of your plate: Whole wheat, barley, wheat berries, quinoa, oats, brown rice, and foods made with them. If you want pasta, go with whole wheat pasta.   Protein power - one-quarter of your plate: Fish, chicken, beans, and nuts are all healthy, versatile protein sources. Limit red meat.   The diet, however, does go beyond the plate, offering a few other suggestions.   Use healthy plant oils, such as olive, canola, soy, corn, sunflower and peanut. Check the labels, and avoid partially hydrogenated oil, which have unhealthy trans fats.   If youre thirsty, drink water. Coffee and tea are good in moderation, but skip sugary drinks and limit milk and dairy products to one or two daily servings.   The type of carbohydrate in the diet is more important than the amount. Some sources of carbohydrates, such as vegetables, fruits, whole grains, and beans-are healthier than others.   Finally, stay active  Signed, Britni Driscoll, DO  04/27/2024 11:10 PM    [facility]

## 2024-04-25 ENCOUNTER — Ambulatory Visit: Payer: Self-pay | Admitting: Cardiology

## 2024-04-25 LAB — CBC
Hematocrit: 38.5 % (ref 34.0–46.6)
Hemoglobin: 12 g/dL (ref 11.1–15.9)
MCH: 27.9 pg (ref 26.6–33.0)
MCHC: 31.2 g/dL — ABNORMAL LOW (ref 31.5–35.7)
MCV: 90 fL (ref 79–97)
Platelets: 308 10*3/uL (ref 150–450)
RBC: 4.3 x10E6/uL (ref 3.77–5.28)
RDW: 14 % (ref 11.7–15.4)
WBC: 6.2 10*3/uL (ref 3.4–10.8)

## 2024-04-25 LAB — BASIC METABOLIC PANEL WITH GFR
BUN/Creatinine Ratio: 19 (ref 12–28)
BUN: 10 mg/dL (ref 8–27)
CO2: 21 mmol/L (ref 20–29)
Calcium: 10.5 mg/dL — ABNORMAL HIGH (ref 8.7–10.3)
Chloride: 104 mmol/L (ref 96–106)
Creatinine, Ser: 0.54 mg/dL — ABNORMAL LOW (ref 0.57–1.00)
Glucose: 109 mg/dL — ABNORMAL HIGH (ref 70–99)
Potassium: 4.3 mmol/L (ref 3.5–5.2)
Sodium: 141 mmol/L (ref 134–144)
eGFR: 100 mL/min/{1.73_m2} (ref >59)

## 2024-05-07 ENCOUNTER — Telehealth (HOSPITAL_COMMUNITY): Payer: Self-pay | Admitting: *Deleted

## 2024-05-07 NOTE — Telephone Encounter (Signed)
 Received call from patient regarding upcoming cardiac imaging study; pt verbalizes understanding of appt date/time, parking situation and where to check in, pre-test NPO status and medications ordered, and verified current allergies; name and call back number provided for further questions should they arise Johney Frame RN Navigator Cardiac Imaging [facility] [phone] office [phone] cell

## 2024-05-09 ENCOUNTER — Ambulatory Visit (HOSPITAL_COMMUNITY)
Admission: RE | Admit: 2024-05-09 | Discharge: 2024-05-09 | Disposition: A | Discharge status: Home / Self Care | Source: Ambulatory Visit | Attending: Cardiology | Admitting: Cardiology

## 2024-05-09 DIAGNOSIS — R079 Chest pain, unspecified: Secondary | ICD-10-CM | POA: Insufficient documentation

## 2024-05-09 DIAGNOSIS — I471 Supraventricular tachycardia, unspecified: Secondary | ICD-10-CM | POA: Diagnosis not present

## 2024-05-09 DIAGNOSIS — R5383 Other fatigue: Secondary | ICD-10-CM | POA: Diagnosis not present

## 2024-05-09 DIAGNOSIS — K449 Diaphragmatic hernia without obstruction or gangrene: Secondary | ICD-10-CM | POA: Diagnosis not present

## 2024-05-09 MED ORDER — NITROGLYCERIN 0.4 MG SL SUBL
0.8000 mg | SUBLINGUAL_TABLET | Freq: Once | SUBLINGUAL | Status: AC
  Administered 2024-05-09: 0.8 mg via SUBLINGUAL

## 2024-05-09 MED ORDER — IOHEXOL 350 MG/ML SOLN
100.0000 mL | Freq: Once | INTRAVENOUS | Status: AC | PRN
Start: 2024-05-09 — End: 2024-05-09
  Administered 2024-05-09: 100 mL via INTRAVENOUS

## 2024-05-09 MED ORDER — NITROGLYCERIN 0.4 MG SL SUBL
SUBLINGUAL_TABLET | SUBLINGUAL | Status: AC
  Filled 2024-05-09: qty 2

## 2024-05-10 ENCOUNTER — Telehealth (HOSPITAL_COMMUNITY): Payer: Self-pay | Admitting: *Deleted

## 2024-05-10 ENCOUNTER — Other Ambulatory Visit (HOSPITAL_COMMUNITY): Payer: Self-pay | Admitting: *Deleted

## 2024-05-10 DIAGNOSIS — R072 Precordial pain: Secondary | ICD-10-CM

## 2024-05-10 NOTE — Telephone Encounter (Signed)
 Calling patient to inform her that her cardiac CT study was not diagnotic due to injector issues. Patient is willing to come back for the study but will need to arrange for transportation. She will call back to reschedule when she gets a date.  Chantal Requena RN Navigator Cardiac Imaging [facility] [phone] Office [phone] Cell

## 2024-05-14 NOTE — Telephone Encounter (Signed)
 FYI - Pt called in to inform Dr. Sheena that due to transportation issues she is not sure when she would be able to get CT done again. She just wants to let Dr. Sheena know in case there was something else she wanted her to do. She is aware Dr. Sheena is out and will receive an response next week.

## 2024-05-23 ENCOUNTER — Other Ambulatory Visit: Payer: Self-pay | Admitting: Family Medicine

## 2024-05-23 ENCOUNTER — Telehealth (HOSPITAL_COMMUNITY): Payer: Self-pay | Admitting: Emergency Medicine

## 2024-05-23 ENCOUNTER — Encounter (HOSPITAL_COMMUNITY): Payer: Self-pay

## 2024-05-23 NOTE — Telephone Encounter (Signed)
 Reaching out to patient to offer assistance regarding upcoming cardiac imaging study; pt verbalizes understanding of appt date/time, parking situation and where to check in, pre-test NPO status and medications ordered, and verified current allergies; name and call back number provided for further questions should they arise Rockwell Alexandria RN Navigator Cardiac Imaging [facility] [phone] office [phone] cell

## 2024-05-24 ENCOUNTER — Ambulatory Visit (HOSPITAL_COMMUNITY)
Admission: RE | Admit: 2024-05-24 | Discharge: 2024-05-24 | Disposition: A | Discharge status: Home / Self Care | Source: Ambulatory Visit | Attending: Cardiology | Admitting: Cardiology

## 2024-05-24 DIAGNOSIS — I2510 Atherosclerotic heart disease of native coronary artery without angina pectoris: Secondary | ICD-10-CM | POA: Diagnosis not present

## 2024-05-24 DIAGNOSIS — I700 Atherosclerosis of aorta: Secondary | ICD-10-CM | POA: Diagnosis not present

## 2024-05-24 DIAGNOSIS — R072 Precordial pain: Secondary | ICD-10-CM | POA: Diagnosis present

## 2024-05-24 DIAGNOSIS — I517 Cardiomegaly: Secondary | ICD-10-CM | POA: Insufficient documentation

## 2024-05-24 MED ORDER — NITROGLYCERIN 0.4 MG SL SUBL
0.8000 mg | SUBLINGUAL_TABLET | Freq: Once | SUBLINGUAL | Status: AC
  Administered 2024-05-24: 0.8 mg via SUBLINGUAL

## 2024-05-24 MED ORDER — IOHEXOL 350 MG/ML SOLN
100.0000 mL | Freq: Once | INTRAVENOUS | Status: AC | PRN
  Administered 2024-05-24: 100 mL via INTRAVENOUS

## 2024-06-01 ENCOUNTER — Other Ambulatory Visit: Payer: Self-pay | Admitting: Family Medicine

## 2024-06-01 DIAGNOSIS — I10 Essential (primary) hypertension: Secondary | ICD-10-CM

## 2024-06-07 ENCOUNTER — Telehealth: Payer: Self-pay | Admitting: Cardiology

## 2024-06-07 DIAGNOSIS — R42 Dizziness and giddiness: Secondary | ICD-10-CM

## 2024-06-07 DIAGNOSIS — R519 Headache, unspecified: Secondary | ICD-10-CM

## 2024-06-07 NOTE — Telephone Encounter (Signed)
 Pt c/o medication issue:  1. Name of Medication: flecainide  (TAMBOCOR ) 50 MG tablet   2. How are you currently taking this medication (dosage and times per day)? As written  3. Are you having a reaction (difficulty breathing--STAT)? No   4. What is your medication issue? Bad headaches, dizziness, seeing [employer]

## 2024-06-08 NOTE — Telephone Encounter (Signed)
 Called patient back with Dr. Ginette advisement.  Tobb, Kardie, DO to [facility] Triage  Melida Rolin HERO, RN    06/08/24  9:44 AM Thank Holley, I agree with her holding the flecainide . When she is here for a follow up with PharmD can we please do an EKG and flecainide  level   Patient agreed to plan and will keep all her appointments that day.

## 2024-06-08 NOTE — Telephone Encounter (Addendum)
 Called patient back about message. Patient stated ever since she has been taking Flecainide , she has been having headaches, dizziness, and seeing black spots. Patient needs to see someone, but she has issues with transportation. Patient has an appointment on 06/13/24 with PharmD, tried to find an appointment for her around that same time, only have a nurse visit will have patient come in to assess. Encouraged patient to hold her Flecainide  to see if her symptoms improve. Will forward to Dr. Sheena for advisement.

## 2024-06-13 ENCOUNTER — Other Ambulatory Visit (HOSPITAL_COMMUNITY): Payer: Self-pay

## 2024-06-13 ENCOUNTER — Ambulatory Visit: Attending: Cardiology | Admitting: *Deleted

## 2024-06-13 ENCOUNTER — Ambulatory Visit: Attending: Cardiology | Admitting: Pharmacist Clinician (PhC)/ Clinical Pharmacy Specialist

## 2024-06-13 ENCOUNTER — Telehealth: Payer: Self-pay | Admitting: Pharmacy Technician

## 2024-06-13 ENCOUNTER — Encounter: Payer: Self-pay | Admitting: Pharmacist Clinician (PhC)/ Clinical Pharmacy Specialist

## 2024-06-13 ENCOUNTER — Telehealth: Payer: Self-pay | Admitting: Pharmacist Clinician (PhC)/ Clinical Pharmacy Specialist

## 2024-06-13 DIAGNOSIS — R079 Chest pain, unspecified: Secondary | ICD-10-CM | POA: Diagnosis not present

## 2024-06-13 DIAGNOSIS — I471 Supraventricular tachycardia, unspecified: Secondary | ICD-10-CM

## 2024-06-13 NOTE — Telephone Encounter (Signed)
 Please do PA for Zepbound - OSA and obesity

## 2024-06-13 NOTE — Telephone Encounter (Signed)
 Pharmacy Patient Advocate Encounter  Received notification from [facility] that Prior Authorization for zepbound has been DENIED.  Full denial letter will be uploaded to the media tab. See denial reason below.   I put osa but still denied

## 2024-06-13 NOTE — Assessment & Plan Note (Signed)
  Patient has not met goal of at least 5% of body weight loss with comprehensive lifestyle modifications alone in the past 3-6 months. Pharmacotherapy is appropriate to pursue as augmentation. Will start Zepbound.   Confirmed patient not pregnant and no personal or family history of medullary thyroid  carcinoma (MTC) or Multiple Endocrine Neoplasia syndrome type 2 (MEN 2).   Advised patient on common side effects including nausea, diarrhea, dyspepsia, decreased appetite, and fatigue. Counseled patient on reducing meal size and how to titrate medication to minimize side effects. Patient aware to call if intolerable side effects or if experiencing dehydration, abdominal pain, or dizziness. Patient will adhere to dietary modifications and will target at least 150 minutes of moderate intensity exercise weekly, plus resistance training twice a week (as recommended by the American Heart Association). This resistance training--such as weightlifting, bodyweight exercises, or using resistance bands, adapted to the patient's ability--will help prevent muscle loss.  Injection technique reviewed at today's visit.    Follow up in 1-2 days regarding coverage of Zepbound . If therapy is initiated, phone or MyChart follow-ups will be conducted every 4 weeks for dose titration until the patient reaches the effective therapeutic dose and target weight.

## 2024-06-13 NOTE — Telephone Encounter (Signed)
 Pt calls    Insurance has patient last name as dillard   Pharmacy Patient Advocate Encounter   Received notification from Pt Calls Messages that prior authorization for zepbound 2.5mg  is required/requested.   Insurance verification completed.   The patient is insured through [facility] .   Per test claim: PA required; PA submitted to above mentioned insurance via latent Key/confirmation #/EOC [id number] Status is pending

## 2024-06-13 NOTE — Progress Notes (Signed)
 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   Tamiflu  [Oseltamivir ] Rash and Cough    Rash, Cough, and Wheezing    History of Present Illness    Kristine Mata is a 68 y.o. female patient of Dr Sheena, in the office today to discuss options for weight management.  Patient notes that she did not have weight problems until the past 5-10 years.  Prior to that she was active and at a healthy weight.  When she developed knee problems, it cut back significantly on her exercise and activity levels, and she has steadily gained weight.      Current weight management medications: none  Current meds that may affect weight: none  Baseline weight/BMI: 301 lb // 43.19  Insurance payor: [employer] education [employer]  Diet: states she doesn't eat much, late morning breakfast and early evening dinner; dinner is usually small piece of salmon and vegetables; not snacking much  Exercise: limited with bad knee, currently has pedals to use while seated  Confirmed patient not pregnant and no personal or family history of medullary thyroid  carcinoma (MTC) or Multiple Endocrine Neoplasia syndrome type 2 (MEN 2).   Social History:   Tobacco: no  Alcohol: no  Accessory Clinical Findings    Lab Results  Component Value Date   CREATININE 0.54 (L)  04/24/2024   BUN 10 04/24/2024   NA 141 04/24/2024   K 4.3 04/24/2024   CL 104 04/24/2024   CO2 21 04/24/2024   Lab Results  Component Value Date   ALT 13 03/01/2024   AST 22 03/01/2024   ALKPHOS 212 (H) 03/01/2024   BILITOT 0.3 03/01/2024   Lab Results  Component Value Date   HGBA1C 6.0 (H) 03/01/2024    Home Medications/Allergies    Current Outpatient Medications  Medication Sig Dispense Refill   albuterol  (VENTOLIN  HFA) 108 (90 Base) MCG/ACT inhaler Inhale 2 puffs into the lungs every 6 (six) hours as needed for wheezing or shortness of breath. 8 g 2   carvedilol  (COREG ) 12.5 MG tablet Take 1 tablet (12.5 mg total) by mouth 2 (two) times daily with a meal. 60 tablet 5   cholecalciferol (VITAMIN D3) 25 MCG (1000 UNIT) tablet Take 1,000 Units by mouth daily.     cloNIDine  (CATAPRES ) 0.1 MG tablet Take 1 tablet by mouth twice daily 60 tablet 0   flecainide  (TAMBOCOR ) 50 MG tablet Take 1 tablet (50 mg total) by mouth 2 (two) times daily. 180 tablet 3   fluticasone  (FLONASE ) 50 MCG/ACT nasal spray Place 2 sprays into both nostrils daily. 48 g 1   furosemide  (LASIX ) 80 MG tablet Take 1 tablet by mouth once daily 90 tablet 0   loratadine  (CLARITIN ) 10 MG tablet Take 1 tablet (  10 mg total) by mouth daily. 90 tablet 1   lovastatin  (MEVACOR ) 40 MG tablet Take 1 tablet (40 mg total) by mouth at bedtime. 90 tablet 1   metoprolol  tartrate (LOPRESSOR ) 50 MG tablet Take 1 tablet (50 mg total) by mouth once for 1 dose. Take 90-120 minutes prior to scan. Hold for SBP less than 110. (Patient not taking: Reported on 06/13/2024) 1 tablet 0   Multiple Vitamins-Minerals (MULTIVITAMIN WITH MINERALS) tablet Take 1 tablet by mouth every other day.     tiZANidine  (ZANAFLEX ) 4 MG tablet Take 1 tablet (4 mg total) by mouth 2 (two) times daily as needed for muscle spasms. 60 tablet 2   traZODone  (DESYREL ) 50 MG tablet Take 1 tablet (50 mg total) by mouth at bedtime as needed. 30 tablet 3   No current  facility-administered medications for this visit.     Allergies  Allergen Reactions   Diltiazem  Rash   Anesthesia S-I-40 [Propofol] Nausea And Vomiting   Codeine Nausea And Vomiting   Olmesartan      Edema in feet   Naproxen  Palpitations   Norvasc  [Amlodipine ] Other (See Comments)    Edema at 2.5 mg dosage.   Tamiflu  [Oseltamivir ] Rash and Cough    Rash, Cough, and Wheezing    Assessment & Plan    Morbid obesity (HCC)  Patient has not met goal of at least 5% of body weight loss with comprehensive lifestyle modifications alone in the past 3-6 months. Pharmacotherapy is appropriate to pursue as augmentation. Will start Zepbound.   Confirmed patient not pregnant and no personal or family history of medullary thyroid  carcinoma (MTC) or Multiple Endocrine Neoplasia syndrome type 2 (MEN 2).   Advised patient on common side effects including nausea, diarrhea, dyspepsia, decreased appetite, and fatigue. Counseled patient on reducing meal size and how to titrate medication to minimize side effects. Patient aware to call if intolerable side effects or if experiencing dehydration, abdominal pain, or dizziness. Patient will adhere to dietary modifications and will target at least 150 minutes of moderate intensity exercise weekly, plus resistance training twice a week (as recommended by the American Heart Association). This resistance training--such as weightlifting, bodyweight exercises, or using resistance bands, adapted to the patient's ability--will help prevent muscle loss.  Injection technique reviewed at today's visit.    Follow up in 1-2 days regarding coverage of Zepbound . If therapy is initiated, phone or MyChart follow-ups will be conducted every 4 weeks for dose titration until the patient reaches the effective therapeutic dose and target weight.   Henrik Orihuela PharmD CPP [facility]  [address] [place], [place] [address] [phone]

## 2024-06-13 NOTE — Patient Instructions (Signed)
   Nurse Visit   Date of Encounter: 06/13/2024 ID: CAILY RAKERS, DOB [birth year], MRN [id number]  PCP:  Terry Wilhelmena [name], [name]   [facility] Providers Cardiologist:  Diannah SHAUNNA Maywood, MD      Visit Details   VS:  There were no vitals taken for this visit. , BMI There is no height or weight on file to calculate BMI.  Wt Readings from Last 3 Encounters:  04/24/24 (!) 302 lb (137 kg)  03/15/24 (!) 303 lb 1.9 oz (137.5 kg)  03/14/24 300 lb (136.1 kg)     Reason for visit: ekg Performed today: Vitals, EKG, Provider consulted:dod Dr. Anner, and Education Changes (medications, testing, etc.) : n/a Length of Visit: 10 minutes    Medications Adjustments/Labs and Tests Ordered: Orders Placed This Encounter  Procedures   EKG 12-Lead   No orders of the defined types were placed in this encounter.    Lorene, Klimas, RN  06/13/2024 11:10 AM

## 2024-06-13 NOTE — Patient Instructions (Signed)
 We will start the prior authorization process to get Zepbound covered by your insurance.   TIPS FOR SUCCESS Write down the reasons why you want to lose weight and post it in a place where you'll see it often. Start small and work your way up. Keep in mind that it takes time to achieve goals, and small steps add up. Any additional movements help to burn calories. Taking the stairs rather than the elevator and parking at the far end of your parking lot are easy ways to start. Brisk walking for at least 30 minutes 4 or more days of the week is an excellent goal to work toward  [employer] WHAT IT MEANS TO FEEL FULL Did you know that it can take 15 minutes or more for your brain to receive the message that you've eaten? That means that, if you eat less food, but consume it slower, you may still feel satisfied. Eating a lot of fruits and vegetables can also help you feel fuller. Eat off of smaller plates so that moderate portions don't seem too small  TITRATION PLAN Will plan to follow the titration plan as below, pending patient is tolerating each dose before increasing to the next. Can slow titration if needed for tolerability.    Start with 2.5 mg once weekly and increase every 4 weeks as tolerated  If you have any questions or concerns, please reach out to us . SABRA  THANK YOU FOR CHOOSING [facility]

## 2024-06-18 ENCOUNTER — Other Ambulatory Visit: Payer: Self-pay | Admitting: Family Medicine

## 2024-06-20 ENCOUNTER — Ambulatory Visit: Payer: Self-pay | Admitting: Cardiology

## 2024-06-22 ENCOUNTER — Ambulatory Visit: Payer: Self-pay | Admitting: Family Medicine

## 2024-06-26 ENCOUNTER — Ambulatory Visit: Admitting: "Endocrinology

## 2024-06-26 ENCOUNTER — Encounter: Payer: Self-pay | Admitting: "Endocrinology

## 2024-06-26 VITALS — BP 130/80 | HR 68 | Ht 70.0 in | Wt 300.0 lb

## 2024-06-26 DIAGNOSIS — M8589 Other specified disorders of bone density and structure, multiple sites: Secondary | ICD-10-CM | POA: Diagnosis not present

## 2024-06-26 DIAGNOSIS — M85851 Other specified disorders of bone density and structure, right thigh: Secondary | ICD-10-CM | POA: Diagnosis not present

## 2024-06-26 DIAGNOSIS — E210 Primary hyperparathyroidism: Secondary | ICD-10-CM | POA: Diagnosis not present

## 2024-06-26 DIAGNOSIS — E559 Vitamin D deficiency, unspecified: Secondary | ICD-10-CM

## 2024-06-26 NOTE — Progress Notes (Signed)
 Outpatient Endocrinology Note Kristine Birmingham, MD    Kristine Mata [birth year] [id number]  Referring Provider: Del Orbe Mata, Ilian* Primary Care Provider: Terry Wilhelmena [name], Kristine Mata Reason for consultation: Subjective   Assessment & Plan  Diagnoses and all orders for this visit:  Hyperparathyroidism, primary (HCC) -     PTH, intact and calcium  -     Renal function panel -     Vitamin D  1,25 dihydroxy -     VITAMIN D  25 Hydroxy (Vit-D Deficiency, Fractures) -     Calcium , 24-Hour Urine with Creatinine  Hypercalcemia -     PTH, intact and calcium  -     Renal function panel -     Vitamin D  1,25 dihydroxy -     VITAMIN D  25 Hydroxy (Vit-D Deficiency, Fractures) -     Calcium , 24-Hour Urine with Creatinine  Vitamin D  deficiency -     PTH, intact and calcium  -     Renal function panel -     Vitamin D  1,25 dihydroxy -     VITAMIN D  25 Hydroxy (Vit-D Deficiency, Fractures) -     Calcium , 24-Hour Urine with Creatinine  Osteopenia of neck of right femur -     PTH, intact and calcium  -     Renal function panel -     Vitamin D  1,25 dihydroxy -     VITAMIN D  25 Hydroxy (Vit-D Deficiency, Fractures) -     Calcium , 24-Hour Urine with Creatinine    Primary hyperparathyroidism, c/o intermittent abdominal pain, osteopenia per 2022 DXA 24 hr urine Ca 12/2022 was 333 (no total urine mentioned)  02/2023 NM PARATHYROID  SCINTIGRAPHY AND SPECT IMAGING showed activity localized to the inferior posterior LEFT lobe of thyroid  gland is suggestive of parathyroid  adenoma. 02/2021 DXA showed T score -1.2 right femur    02/2024 DXA showed T score -1.7 right femur, T-score: -1.6  right total hip, T-score: -1.1 L femoral neck  Patient is not keen on surgery No indication for surgery currently: ordered repeat labs On 1000 international units Vit D every day  06/26/24 ordered repeat labs + 24 hour urine calcium   Discussed importance of good hydration to prevent severe hypercalcemia:  -  8 eight ounce glasses of fluids per day.  - low threshold for ER visit for IV fluids if having     nausea/vomiting/diarrhea and cannot keep well hydrated.   Return in about 6 months (around 12/27/2024) for visit, labs today.   I have reviewed current medications, nurse's notes, allergies, vital signs, past medical and surgical history, family medical history, and social history for this encounter. Counseled patient on symptoms, examination findings, lab findings, imaging results, treatment decisions and monitoring and prognosis. The patient understood the recommendations and agrees with the treatment plan. All questions regarding treatment plan were fully answered.  Kristine Birmingham, MD  06/26/24   History of Present Illness HPI  Kristine Mata is a 68 y.o. female referred by Dr. Terry Wilhelmena Mata for follow up for primary hyperparathyroidism driven hypercalcemia.    Interval history: On 1000 international units Vit D a day  No falls/fractures since last visit No heart burn/nausea/vomiting Sometimes gets abdominal pain when constipated Sometimes feels depressed due to death of sister and nephew Palpitation at night, seen cardiologist   Initial history:   She was found to have elevated serum calcium  of 10.4 (corrected for 12/2022) mg/dL in with a high intact PTH level of 108 pg/ml. Patient has had a high  Calcium  at least since 08/2021.    Component     Latest Ref Rng 06/20/2023  Sodium     135 - 145 mEq/L 141   Potassium     3.5 - 5.1 mEq/L 4.1   Chloride     96 - 112 mEq/L 102   CO2     19 - 32 mEq/L 29   Albumin     3.5 - 5.2 g/dL 4.6   BUN     6 - 23 mg/dL 14   Creatinine     9.59 - 1.20 mg/dL 9.30   Glucose     70 - 99 mg/dL 97   Phosphorus     2.3 - 4.6 mg/dL 3.5   GFR     >39.99 mL/min 89.17   Calcium      8.4 - 10.5 mg/dL 88.8 (H)   Calcium      8.7 - 10.3 mg/dL 89.2 (H)   Vitamin D  1, 25 (OH) Total     18 - 72 pg/mL 97 (H)   Vitamin D3 1, 25 (OH)     pg/mL  97   Vitamin D2 1, 25 (OH)     pg/mL <8   PTH, Intact     15 - 65 pg/mL 133 (H)   PTH Interp Comment   VITD     30.00 - 100.00 ng/mL 26.13 (L)     No new symptoms Patient feel well except BP issues being managed by another provider   Initial history:   Patient denies a history of kidney stones.  She  current hematuria No Polyuria No Nocturia Once a night  Excessive Thirst  renal failure No Anorexia No abdominal pain yes, twice a week Heartburn No constipation sometimes  nausea or vomiting no  h/o peptic ulcer disease no  depression no  confusion no excessive fatigue no fracture yes, ground level in 2015-2016 osteoporosis no, osteopenia  Headaches sometimes Numbness no Tingling yes  She  take Calcium  No She  take Vitamin D  supplements No  She denies a history of taking chronic lithium No She denies a history of thiazide diuretics.   She denies self/family history of renal stones/hypercalcemia (except niece) denies a personal history of MEN syndromes/medullary thyroid  cancer/ pheochromocytoma  Physical Exam  BP 130/80   Pulse 68   Ht 5' 10 (1.778 m)   Wt 300 lb (136.1 kg)   SpO2 96%   BMI 43.05 kg/m    Constitutional: well developed, well nourished Head: normocephalic, atraumatic Eyes: sclera anicteric, no redness Neck: supple Lungs: normal respiratory effort Neurology: alert and oriented Skin: dry, no appreciable rashes Musculoskeletal: no appreciable defects Psychiatric: normal mood and affect   Current Medications Patient's Medications  New Prescriptions   No medications on file  Previous Medications   ALBUTEROL  (VENTOLIN  HFA) 108 (90 BASE) MCG/ACT INHALER    Inhale 2 puffs into the lungs every 6 (six) hours as needed for wheezing or shortness of breath.   CARVEDILOL  (COREG ) 12.5 MG TABLET    Take 1 tablet (12.5 mg total) by mouth 2 (two) times daily with a meal.   CHOLECALCIFEROL (VITAMIN D3) 25 MCG (1000 UNIT) TABLET    Take 1,000 Units by  mouth daily.   CLONIDINE  (CATAPRES ) 0.1 MG TABLET    Take 1 tablet by mouth twice daily   FLECAINIDE  (TAMBOCOR ) 50 MG TABLET    Take 1 tablet (50 mg total) by mouth 2 (two) times daily.   FLUTICASONE  (FLONASE ) 50 MCG/ACT NASAL SPRAY  Place 2 sprays into both nostrils daily.   FUROSEMIDE  (LASIX ) 80 MG TABLET    Take 1 tablet by mouth once daily   LORATADINE  (CLARITIN ) 10 MG TABLET    Take 1 tablet (10 mg total) by mouth daily.   LOVASTATIN  (MEVACOR ) 40 MG TABLET    Take 1 tablet (40 mg total) by mouth at bedtime.   METOPROLOL  TARTRATE (LOPRESSOR ) 50 MG TABLET    Take 1 tablet (50 mg total) by mouth once for 1 dose. Take 90-120 minutes prior to scan. Hold for SBP less than 110.   MULTIPLE VITAMINS-MINERALS (MULTIVITAMIN WITH MINERALS) TABLET    Take 1 tablet by mouth every other day.   TIZANIDINE  (ZANAFLEX ) 4 MG TABLET    Take 1 tablet (4 mg total) by mouth 2 (two) times daily as needed for muscle spasms.   TRAZODONE  (DESYREL ) 50 MG TABLET    Take 1 tablet (50 mg total) by mouth at bedtime as needed.  Modified Medications   No medications on file  Discontinued Medications   No medications on file    Allergies Allergies  Allergen Reactions   Diltiazem  Rash   Anesthesia S-I-40 [Propofol] Nausea And Vomiting   Codeine Nausea And Vomiting   Olmesartan      Edema in feet   Naproxen  Palpitations   Norvasc  [Amlodipine ] Other (See Comments)    Edema at 2.5 mg dosage.   Tamiflu  [Oseltamivir ] Rash and Cough    Rash, Cough, and Wheezing    Past Medical History Past Medical History:  Diagnosis Date   Ectopic pregnancy    Heart murmur    Hyperlipidemia    Hypertension    Insomnia, unspecified    OSA on CPAP    moderate OSA with an AHI of 14.8/hr on auto CPAP   Osteopenia 03/12/2021   Right knee pain    SVT (supraventricular tachycardia) (HCC)     Past Surgical History Past Surgical History:  Procedure Laterality Date   ABDOMINAL HYSTERECTOMY     BREAST CYST EXCISION Left    68  years old   COLONOSCOPY  2016   [facility]; 1 tubular adenoma, 1 hyperplastic polyp, TI biopsy with reactive lymphoid hyperplasia without active or chronic enteritis.   COLONOSCOPY N/A 12/02/2021   Procedure: COLONOSCOPY;  Surgeon: Shaaron Lamar HERO, MD;  Location: [facility];  Service: Endoscopy;  Laterality: N/A;  7:30am   cyst left breast      SALPINGECTOMY Left    TMJ ARTHROPLASTY      Family History family history includes COPD in her brother and brother; Colon cancer in her sister and sister; Diabetes in her brother, mother, and sister; Heart disease in her brother and mother; Hypertension in her brother, brother, father, mother, sister, and sister; Lung disease in her brother; Pancreatic cancer in her mother; Stroke in her brother and maternal grandmother; Thyroid  disease in her brother and mother.  Social History Social History   Socioeconomic History   Marital status: Divorced    Spouse name: Not on file   Number of children: 1   Years of education: 12   Highest education level: 12th grade  Occupational History   Occupation: Retired  Tobacco Use   Smoking status: Never    Passive exposure: Never   Smokeless tobacco: Never  Vaping Use   Vaping status: Never Used  Substance and Sexual Activity   Alcohol use: Never   Drug use: Never   Sexual activity: Not Currently    Partners: Male  Birth control/protection: Surgical  Other Topics Concern   Not on file  Social History Narrative   Lives alone. Has one son - lives in [place].   Social Drivers of Health   Financial Resource Strain: Low Risk  (03/14/2024)   Overall Financial Resource Strain (CARDIA)    Difficulty of Paying Living Expenses: Not hard at all  Food Insecurity: Food Insecurity Present (03/14/2024)   Hunger Vital Sign    Worried About Running Out of Food in the Last Year: Often true    Ran Out of Food in the Last Year: Often true  Transportation Needs: Unmet Transportation Needs (03/14/2024)   PRAPARE -  Administrator, Civil Service (Medical): Yes    Lack of Transportation (Non-Medical): Yes  Physical Activity: Insufficiently Active (03/14/2024)   Exercise Vital Sign    Days of Exercise per Week: 5 days    Minutes of Exercise per Session: 20 min  Stress: No Stress Concern Present (03/14/2024)   [employer] of Occupational Health - Occupational Stress Questionnaire    Feeling of Stress : Not at all  Social Connections: Moderately Isolated (03/14/2024)   Social Connection and Isolation Panel    Frequency of Communication with Friends and Family: More than three times a week    Frequency of Social Gatherings with Friends and Family: More than three times a week    Attends Religious Services: More than 4 times per year    Active Member of Clubs or Organizations: No    Attends Banker Meetings: Never    Marital Status: Divorced  Catering manager Violence: Not At Risk (03/14/2024)   Humiliation, Afraid, Rape, and Kick questionnaire    Fear of Current or Ex-Partner: No    Emotionally Abused: No    Physically Abused: No    Sexually Abused: No    Lab Results  Component Value Date   CHOL 174 03/01/2024   Lab Results  Component Value Date   HDL 73 03/01/2024   Lab Results  Component Value Date   LDLCALC 84 03/01/2024   Lab Results  Component Value Date   TRIG 93 03/01/2024   Lab Results  Component Value Date   CHOLHDL 2.4 03/01/2024   Lab Results  Component Value Date   CREATININE 0.54 (L) 04/24/2024   Lab Results  Component Value Date   GFR 89.17 06/20/2023      Component Value Date/Time   NA 141 04/24/2024 1129   K 4.3 04/24/2024 1129   CL 104 04/24/2024 1129   CO2 21 04/24/2024 1129   GLUCOSE 109 (H) 04/24/2024 1129   GLUCOSE 97 06/20/2023 1401   BUN 10 04/24/2024 1129   CREATININE 0.54 (L) 04/24/2024 1129   CALCIUM  10.5 (H) 04/24/2024 1129   PROT 7.1 03/01/2024 1026   ALBUMIN 4.5 03/01/2024 1026   AST 22 03/01/2024 1026   ALT 13  03/01/2024 1026   ALKPHOS 212 (H) 03/01/2024 1026   BILITOT 0.3 03/01/2024 1026   GFRNONAA >60 08/04/2022 2143   GFRAA 106 07/15/2020 1419      Latest Ref Rng & Units 04/24/2024   11:29 AM 03/05/2024    9:22 AM 03/01/2024   10:26 AM  BMP  Glucose 70 - 99 mg/dL 890   883   BUN 8 - 27 mg/dL 10   11   Creatinine 9.42 - 1.00 mg/dL 9.45   9.41   BUN/Creat Ratio 12 - 28 19   19    Sodium 134 - 144  mmol/L 141   143   Potassium 3.5 - 5.2 mmol/L 4.3   4.2   Chloride 96 - 106 mmol/L 104   103   CO2 20 - 29 mmol/L 21   23   Calcium  8.7 - 10.3 mg/dL 89.4  89.4  89.0        Component Value Date/Time   WBC 6.2 04/24/2024 1131   WBC 7.8 08/04/2022 2143   RBC 4.30 04/24/2024 1131   RBC 4.46 08/04/2022 2143   HGB 12.0 04/24/2024 1131   HCT 38.5 04/24/2024 1131   PLT 308 04/24/2024 1131   MCV 90 04/24/2024 1131   MCH 27.9 04/24/2024 1131   MCH 29.4 08/04/2022 2143   MCHC 31.2 (L) 04/24/2024 1131   MCHC 32.1 08/04/2022 2143   RDW 14.0 04/24/2024 1131   LYMPHSABS 2.8 03/01/2024 1026   MONOABS 0.6 08/04/2022 2143   EOSABS 0.1 03/01/2024 1026   BASOSABS 0.0 03/01/2024 1026   Lab Results  Component Value Date   TSH 0.633 03/01/2024   TSH 0.50 03/21/2023   TSH 0.578 12/23/2022   FREET4 1.27 03/01/2024   FREET4 0.88 03/21/2023       Parts of this note may have been dictated using voice recognition software. There may be variances in spelling and vocabulary which are unintentional. Not all errors are proofread. Please notify the dino if any discrepancies are noted or if the meaning of any statement is not clear.

## 2024-06-27 ENCOUNTER — Telehealth: Payer: Self-pay | Admitting: "Endocrinology

## 2024-06-27 NOTE — Telephone Encounter (Signed)
 Patient called to say that she will be going to [facility] in [place], [place] tomorrow , 06/28/2024, for a Drop Off.  Patient states that she did not get any lab orders to take with her.    [facility]  [address] [place], [place] [address]  (Phone)  [phone] (Fax)  [phone]

## 2024-06-28 ENCOUNTER — Other Ambulatory Visit

## 2024-06-28 DIAGNOSIS — E559 Vitamin D deficiency, unspecified: Secondary | ICD-10-CM | POA: Diagnosis not present

## 2024-06-28 DIAGNOSIS — M85851 Other specified disorders of bone density and structure, right thigh: Secondary | ICD-10-CM | POA: Diagnosis not present

## 2024-06-28 DIAGNOSIS — E210 Primary hyperparathyroidism: Secondary | ICD-10-CM | POA: Diagnosis not present

## 2024-06-29 ENCOUNTER — Other Ambulatory Visit: Payer: Self-pay | Admitting: "Endocrinology

## 2024-06-29 DIAGNOSIS — E210 Primary hyperparathyroidism: Secondary | ICD-10-CM

## 2024-06-29 DIAGNOSIS — E559 Vitamin D deficiency, unspecified: Secondary | ICD-10-CM

## 2024-06-29 LAB — CALCIUM, 24-HOUR URINE WITH CREATININE
CALCIUM/CREATININE RATIO: 249 mg/g{creat} (ref 30–275)
Calcium, 24H Urine: 204 mg/(24.h)
Creatinine, 24H Ur: 0.8 g/(24.h) (ref 0.50–2.15)

## 2024-06-30 LAB — RENAL FUNCTION PANEL
Albumin: 4.5 g/dL (ref 3.6–5.1)
BUN/Creatinine Ratio: 22 (calc) (ref 6–22)
BUN: 10 mg/dL (ref 7–25)
CO2: 29 mmol/L (ref 20–32)
Calcium: 10.5 mg/dL — ABNORMAL HIGH (ref 8.6–10.4)
Chloride: 106 mmol/L (ref 98–110)
Creat: 0.45 mg/dL — ABNORMAL LOW (ref 0.50–1.05)
Glucose, Bld: 113 mg/dL — ABNORMAL HIGH (ref 65–99)
Phosphorus: 3.4 mg/dL (ref 2.1–4.3)
Potassium: 4.2 mmol/L (ref 3.5–5.3)
Sodium: 142 mmol/L (ref 135–146)

## 2024-06-30 LAB — VITAMIN D 1,25 DIHYDROXY
Vitamin D 1, 25 (OH)2 Total: 139 pg/mL — ABNORMAL HIGH (ref 18–72)
Vitamin D2 1, 25 (OH)2: <8 pg/mL
Vitamin D3 1, 25 (OH)2: 139 pg/mL

## 2024-06-30 LAB — VITAMIN D 25 HYDROXY (VIT D DEFICIENCY, FRACTURES): Vit D, 25-Hydroxy: 26 ng/mL — ABNORMAL LOW (ref 30–100)

## 2024-06-30 LAB — PTH, INTACT AND CALCIUM
Calcium: 10.5 mg/dL — ABNORMAL HIGH (ref 8.6–10.4)
PTH: 183 pg/mL — ABNORMAL HIGH (ref 16–77)

## 2024-07-04 ENCOUNTER — Ambulatory Visit (INDEPENDENT_AMBULATORY_CARE_PROVIDER_SITE_OTHER): Admitting: Family Medicine

## 2024-07-04 ENCOUNTER — Encounter: Payer: Self-pay | Admitting: Family Medicine

## 2024-07-04 VITALS — BP 136/78 | HR 71 | Resp 16 | Ht 70.0 in | Wt 302.1 lb

## 2024-07-04 DIAGNOSIS — H6692 Otitis media, unspecified, left ear: Secondary | ICD-10-CM | POA: Diagnosis not present

## 2024-07-04 DIAGNOSIS — E782 Mixed hyperlipidemia: Secondary | ICD-10-CM | POA: Diagnosis not present

## 2024-07-04 DIAGNOSIS — B9689 Other specified bacterial agents as the cause of diseases classified elsewhere: Secondary | ICD-10-CM

## 2024-07-04 DIAGNOSIS — R7303 Prediabetes: Secondary | ICD-10-CM | POA: Diagnosis not present

## 2024-07-04 DIAGNOSIS — H6690 Otitis media, unspecified, unspecified ear: Secondary | ICD-10-CM | POA: Insufficient documentation

## 2024-07-04 MED ORDER — AMOXICILLIN-POT CLAVULANATE 875-125 MG PO TABS
1.0000 | ORAL_TABLET | Freq: Two times a day (BID) | ORAL | 0 refills | Status: AC
Start: 2024-07-04 — End: 2024-07-11

## 2024-07-04 MED ORDER — ALBUTEROL SULFATE HFA 108 (90 BASE) MCG/ACT IN AERS: 2.0000 | INHALATION_SPRAY | Freq: Four times a day (QID) | RESPIRATORY_TRACT | 3 refills | Status: AC | PRN

## 2024-07-04 NOTE — Assessment & Plan Note (Signed)
 Start Augmentin (Amoxicillin-Clavulanate) 875-125 mg twice daily x 7 days  Recommend using a nasal decongestant or nasal corticosteroid spray to promote Eustachian tube drainage and reduce ear pressure. Encourage warm compresses over the ear to alleviate discomfort and advise staying hydrated. Follow up in 7-10 days to assess resolution of symptoms. Educate the patient on completing the full course of antibiotics and monitoring for red flags such as worsening pain, high fever, or new dizziness

## 2024-07-04 NOTE — Progress Notes (Signed)
 Established Patient Office Visit   Subjective  Patient ID: Kristine Mata, female    DOB: [birth year]  Age: 68 y.o. MRN: [id number]  Chief Complaint  Patient presents with   Medical Management of Chronic Issues    4 month follow up   Insomnia    Pt complains of trouble sleeping and nightmares since starting Clonidine      She  has a past medical history of Ectopic pregnancy, Heart murmur, Hyperlipidemia, Hypertension, Insomnia, unspecified, OSA on CPAP, Osteopenia (03/12/2021), Right knee pain, and SVT (supraventricular tachycardia) (HCC).  HPI Patient presents to the clinic for chronic follow up. For the details of today's visit, please refer to assessment and plan.   Review of Systems  Constitutional:  Positive for fever.  HENT:  Positive for ear discharge, ear pain, sinus pain and sore throat.   Respiratory:  Negative for shortness of breath.   Cardiovascular:  Negative for chest pain.      Objective:     BP 136/78   Pulse 71   Resp 16   Ht 5' 10 (1.778 m)   Wt (!) 302 lb 1.9 oz (137 kg)   SpO2 97%   BMI 43.35 kg/m  BP Readings from Last 3 Encounters:  07/04/24 136/78  06/26/24 130/80  05/24/24 (!) 158/86      Physical Exam Vitals reviewed.  Constitutional:      General: She is not in acute distress.    Appearance: Normal appearance. She is not ill-appearing, toxic-appearing or diaphoretic.  HENT:     Head: Normocephalic.     Right Ear: No drainage. Tympanic membrane is not erythematous.     Left Ear: Drainage present. Tympanic membrane is erythematous and bulging.  Eyes:     General:        Right eye: No discharge.        Left eye: No discharge.     Conjunctiva/sclera: Conjunctivae normal.  Cardiovascular:     Rate and Rhythm: Normal rate.     Pulses: Normal pulses.     Heart sounds: Normal heart sounds.  Pulmonary:     Effort: Pulmonary effort is normal. No respiratory distress.     Breath sounds: Normal breath sounds.  Abdominal:      General: Bowel sounds are normal.     Palpations: Abdomen is soft.     Tenderness: There is no abdominal tenderness. There is no right CVA tenderness, left CVA tenderness or guarding.  Musculoskeletal:        General: Normal range of motion.     Cervical back: Normal range of motion.  Skin:    General: Skin is warm and dry.     Capillary Refill: Capillary refill takes less than 2 seconds.  Neurological:     General: No focal deficit present.     Mental Status: She is alert and oriented to person, place, and time.     Coordination: Coordination normal.     Gait: Gait normal.  Psychiatric:        Mood and Affect: Mood normal.        Behavior: Behavior normal.        Thought Content: Thought content normal.        Judgment: Judgment normal.      No results found for any visits on 07/04/24.  The 10-year ASCVD risk score (Arnett DK, et al., 2019) is: 11.4%    Assessment & Plan:  Prediabetes Assessment & Plan: Last Hemoglobin A1c 6.0 Repeat panel  today Reviewed non-pharmacological interventions, including a balanced diet rich in lean proteins, healthy fats, whole grains, and high-fiber vegetables. Emphasized reducing refined sugars and processed carbohydrates, and incorporating more fruits, leafy greens, and legumes.   Orders: -     Hemoglobin A1c  Mixed hyperlipidemia -     Lipid panel  Ear infection -     Amoxicillin -Pot Clavulanate; Take 1 tablet by mouth 2 (two) times daily for 7 days.  Dispense: 14 tablet; Refill: 0  Bacterial infection of left ear Assessment & Plan: Start Augmentin  (Amoxicillin -Clavulanate) 875-125 mg twice daily x 7 days  Recommend using a nasal decongestant or nasal corticosteroid spray to promote Eustachian tube drainage and reduce ear pressure. Encourage warm compresses over the ear to alleviate discomfort and advise staying hydrated. Follow up in 7-10 days to assess resolution of symptoms. Educate the patient on completing the full course of  antibiotics and monitoring for red flags such as worsening pain, high fever, or new dizziness    Other orders -     Albuterol  Sulfate HFA; Inhale 2 puffs into the lungs every 6 (six) hours as needed for wheezing or shortness of breath.  Dispense: 8 g; Refill: 3    Return in about 4 months (around 11/03/2024), or if symptoms worsen or fail to improve, for chronic follow-up with [facility] .   Hilario Kidd Wilhelmena Falter, [name]

## 2024-07-04 NOTE — Patient Instructions (Signed)

## 2024-07-04 NOTE — Assessment & Plan Note (Signed)
 Last Hemoglobin A1c 6.0 Repeat panel today Reviewed non-pharmacological interventions, including a balanced diet rich in lean proteins, healthy fats, whole grains, and high-fiber vegetables. Emphasized reducing refined sugars and processed carbohydrates, and incorporating more fruits, leafy greens, and legumes.

## 2024-07-05 ENCOUNTER — Ambulatory Visit: Payer: Self-pay | Admitting: Family Medicine

## 2024-07-05 ENCOUNTER — Ambulatory Visit: Admitting: "Endocrinology

## 2024-07-05 LAB — LIPID PANEL
Chol/HDL Ratio: 2.7 ratio (ref 0.0–4.4)
Cholesterol, Total: 179 mg/dL (ref 100–199)
HDL: 67 mg/dL (ref >39)
LDL Chol Calc (NIH): 100 mg/dL — ABNORMAL HIGH (ref 0–99)
Triglycerides: 64 mg/dL (ref 0–149)
VLDL Cholesterol Cal: 12 mg/dL (ref 5–40)

## 2024-07-05 LAB — HEMOGLOBIN A1C
Est. average glucose Bld gHb Est-mCnc: 126 mg/dL
Hgb A1c MFr Bld: 6 % — ABNORMAL HIGH (ref 4.8–5.6)

## 2024-07-06 ENCOUNTER — Ambulatory Visit: Payer: Self-pay

## 2024-07-06 NOTE — Telephone Encounter (Signed)
  FYI Only or Action Required?: Action required by provider: medication refill request and update on patient condition. Not refill, new RX for Diflucan   Patient was last seen in primary care on 07/04/2024 by Terry Wilhelmena [name], [name].  Called Nurse Triage reporting Vaginal Itching.  Symptoms began yesterday.  Interventions attempted: OTC medications: monistat.  Symptoms are: unchanged.  Triage Disposition: Home Care  Patient/caregiver understands and will follow disposition?: Yes  Copied from CRM [id number]. Topic: Clinical - Medication Question >> Jul 06, 2024  3:33 PM Santiya F wrote: Reason for CRM: Patient is calling in because she was given antibiotics by her provider and she says she is currently experiencing vaginal itching from the antibiotics. Patient is requesting medication for a yeast infection to help. Reason for Disposition  [1] Symptoms of a yeast infection (i.e., itchy, white discharge, not bad smelling) AND [2] feels like prior vaginal yeast infections  Answer Assessment - Initial Assessment Questions Patient on amoxicillin  using monistat at this time, but not controlling symptoms.  Requesting Diflucan  be called into her [facility] in [place]    1. SYMPTOM: What's the main symptom you're concerned about? (e.g., pain, itching, dryness)     Vaginal itching 2. LOCATION: Where is the  itching located? (e.g., inside/outside, left/right)     inside 3. ONSET: When did the  itching  start?     One day ago 4. PAIN: Is there any pain? If Yes, ask: How bad is it? (Scale: 1-10; mild, moderate, severe)     Burning with urination 5. ITCHING: Is there any itching? If Yes, ask: How bad is it? (Scale: 1-10; mild, moderate, severe)     5 6. CAUSE: What do you think is causing the discharge? Have you had the same problem before? What happened then?     Yeast infection from antibiotic use 7. OTHER SYMPTOMS: Do you have any other symptoms? (e.g., fever,  itching, vaginal bleeding, pain with urination, injury to genital area, vaginal foreign body)     denies 8. PREGNANCY: Is there any chance you are pregnant? When was your last menstrual period?     N/A  Protocols used: Vaginal Symptoms-A-AH

## 2024-07-09 ENCOUNTER — Other Ambulatory Visit: Payer: Self-pay | Admitting: Family Medicine

## 2024-07-09 MED ORDER — FLUCONAZOLE 150 MG PO TABS: 150.0000 mg | ORAL_TABLET | Freq: Once | ORAL | 0 refills | Status: AC

## 2024-07-09 NOTE — Telephone Encounter (Signed)
 sent

## 2024-07-09 NOTE — Telephone Encounter (Signed)
 C/o yeast infection after taking antibiotics. Requesting diflucan  be sent in. Pls advise

## 2024-07-10 NOTE — Telephone Encounter (Signed)
Pt informed

## 2024-07-18 ENCOUNTER — Other Ambulatory Visit: Payer: Self-pay | Admitting: Family Medicine

## 2024-07-20 ENCOUNTER — Telehealth: Payer: Self-pay | Admitting: Cardiology

## 2024-07-20 NOTE — Telephone Encounter (Signed)
 Spoke with the patient who states that she has been experiencing dizziness for the past several months. She states that this started when she was put on carvedilol . She reports that she is also taking clonidine . These were prescribed by her PCP. She reports that she is no longer taking flecainide  or metoprolol . She called in several weeks ago due to similar symptoms that she thought was due to flecainide  and it was recommended that she stop taking it. Dizziness has continued despite stopping flecainide .  She states that her blood pressures have been good, although she is unable to provide me with any readings. Reports that her heart rates have been 60-70s. She states that she gets dizzy when she stands up and that she is not getting up too fast due to knee issues. She states that she is staying hydrated. She states that she has contacted her PCP and feels like she keeps getting thrown back and forth between her PCP and cardiologist. She would like to know what Dr. Sheena recommends.

## 2024-07-20 NOTE — Telephone Encounter (Signed)
 Pt c/o medication issue:  1. Name of Medication: carvedilol  (COREG ) 12.5 MG tablet   2. How are you currently taking this medication (dosage and times per day)? As Written  3. Are you having a reaction (difficulty breathing--STAT)? No  4. What is your medication issue? Patient stated she was on Metoprolol  and told the dosage amount is too high. Patient was informed to start taking Carvedilol  instead. Patient stated when she takes this medication in the morning she feel very dizzy. Patient stated she is having palpitations at night when laying in the bed. Patient stated she does not get Palpitations during the day. Please advise.  Patient c/o Palpitations: STAT if patient c/o lightheadedness, shortness of breath, or chest pain  How long have you had palpitations/irregular HR/ Afib? Are you having the symptoms now? At night when in the bed  Are you currently experiencing lightheadedness, SOB or CP?  No  Do you have a history of afib (atrial fibrillation) or irregular heart rhythm? No  Have you checked your BP or HR? (document readings if available): n/a  Are you experiencing any other symptoms? Dizziness

## 2024-07-25 ENCOUNTER — Ambulatory Visit: Admitting: "Endocrinology

## 2024-07-30 NOTE — Telephone Encounter (Signed)
 Patient identification verified by 2 forms.   Called and spoke to patient  Patient states:  -Palpations at night.  -Pt unsure why Metoprolol  was stopped.  -Palpations has still be present while taking Carvedilol  -I'm scared my heart will give out with the beating like it is.    Patient denies:  -Dizziness     Interventions/Plan: -Advised to continue to monitor her symptoms .  -Medication list reviewed - up to day now  -Pt asked to send blood pressure to MyChart.   Reviewed ED warning signs/precautions  Patient agrees with plan, no questions at this time

## 2024-08-02 ENCOUNTER — Other Ambulatory Visit: Payer: Self-pay | Admitting: Family Medicine

## 2024-08-02 DIAGNOSIS — Z1231 Encounter for screening mammogram for malignant neoplasm of breast: Secondary | ICD-10-CM

## 2024-08-07 ENCOUNTER — Encounter: Payer: Self-pay | Admitting: *Deleted

## 2024-08-08 ENCOUNTER — Ambulatory Visit: Attending: Cardiology | Admitting: Cardiology

## 2024-08-08 ENCOUNTER — Encounter: Payer: Self-pay | Admitting: Cardiology

## 2024-08-08 VITALS — BP 140/86 | HR 64 | Ht 70.0 in | Wt 298.8 lb

## 2024-08-08 DIAGNOSIS — E213 Hyperparathyroidism, unspecified: Secondary | ICD-10-CM

## 2024-08-08 DIAGNOSIS — Z79899 Other long term (current) drug therapy: Secondary | ICD-10-CM

## 2024-08-08 DIAGNOSIS — I471 Supraventricular tachycardia, unspecified: Secondary | ICD-10-CM

## 2024-08-08 DIAGNOSIS — I4729 Other ventricular tachycardia: Secondary | ICD-10-CM | POA: Diagnosis not present

## 2024-08-08 DIAGNOSIS — I2510 Atherosclerotic heart disease of native coronary artery without angina pectoris: Secondary | ICD-10-CM | POA: Diagnosis not present

## 2024-08-08 MED ORDER — SPIRONOLACTONE 25 MG PO TABS
12.5000 mg | ORAL_TABLET | Freq: Every day | ORAL | 3 refills | Status: AC
Start: 2024-08-08 — End: 2024-11-06

## 2024-08-08 MED ORDER — METOPROLOL SUCCINATE ER 25 MG PO TB24: 75.0000 mg | ORAL_TABLET | Freq: Every evening | ORAL | 3 refills | Status: DC

## 2024-08-08 NOTE — Patient Instructions (Addendum)
 Medication Instructions:  Your physician has recommended you make the following change in your medication:  STOP: Flecainide   STOP: Coreg   START: Toprol -XL 75 mg (three 25 mg tablets)  nightly START: Aldactone  12.5 mg (half tablet) once daily  *If you need a refill on your cardiac medications before your next appointment, please call your pharmacy*  Lab Work: CMET, Mag, TSH If you have labs (blood work) drawn today and your tests are completely normal, you will receive your results only by: MyChart Message (if you have MyChart) OR A paper copy in the mail If you have any lab test that is abnormal or we need to change your treatment, we will call you to review the results.  Follow-Up: At [facility], you and your health needs are our priority.  As part of our continuing mission to provide you with exceptional heart care, our providers are all part of one team.  This team includes your primary Cardiologist (physician) and Advanced Practice Providers or APPs (Physician Assistants and Nurse Practitioners) who all work together to provide you with the care you need, when you need it.  Your next appointment:   12 week(s)  Provider:   Kardie Tobb, DO

## 2024-08-08 NOTE — Progress Notes (Unsigned)
 Cardiology Office Note:    Date:  08/10/2024   ID:  Kristine Mata, DOB 12-14-1955, MRN [id number]  PCP:  Terry Wilhelmena [name], [name]  Cardiologist:  Earvin Blazier, DO  Electrophysiologist:  None   Referring MD: Del Orbe Polanco, Ilian*    I have been having chest pain  History of Present Illness:    Kristine Mata is a 68 y.o. female with a hx of Hypertension, Hyperlipidemia, PSVT, 5 beats of NSVT noted in the overnight hours on a zio monitor, OSA usesCPA    She has seen Dr, Mallipeddi and was last seen on 02/23/2024. During that visit she was started on Amlodipine  2.5 mg daily. She had reported intolerance to Cardizem . Coreg  was continued.   Of note she has had to issues with her medications - transitioned from metorprolol to coreg  due to need for better blood pressure control and asymptomatic second degree AV block.   Today she reports that she has been experiencing  persistent heart palpitations, primarily at night around 2 to 3 AM, with a heart rate recorded up to 193 beats per minute at 5 AM. These symptoms have been intermittent since 2019.  At her last visit which was on April 24, 2024 at that time we talked about her medication she was on carvedilol  which I continue, initiated flecainide  because of her paroxysmal SVT and NSVT.  We also ordered a coronary CTA which did not show any evidence of significant blockage.  We continued her clonidine  0.1 mg twice a day.  Since her visit she has called and told that she stopped the flecainide  because it made her significantly fatigued.  She was having issues with breakthrough palpitations at night she called the office and I also did get of a phone call from her son reporting that she was experiencing typical palpitations and she wanted to discuss medication change.  She is here today for follow-up visit.  She is concerned that she is having and waking up at night for burst of palpitations.  She is concerned about that.  Past  Medical History:  Diagnosis Date   Ectopic pregnancy    Heart murmur    Hyperlipidemia    Hypertension    Insomnia, unspecified    OSA on CPAP    moderate OSA with an AHI of 14.8/hr on auto CPAP   Osteopenia 03/12/2021   Right knee pain    SVT (supraventricular tachycardia)     Past Surgical History:  Procedure Laterality Date   ABDOMINAL HYSTERECTOMY     BREAST CYST EXCISION Left    68 years old   COLONOSCOPY  2016   [facility]; 1 tubular adenoma, 1 hyperplastic polyp, TI biopsy with reactive lymphoid hyperplasia without active or chronic enteritis.   COLONOSCOPY N/A 12/02/2021   Procedure: COLONOSCOPY;  Surgeon: Shaaron Lamar HERO, MD;  Location: [facility];  Service: Endoscopy;  Laterality: N/A;  7:30am   cyst left breast      SALPINGECTOMY Left    TMJ ARTHROPLASTY      Current Medications: Current Meds  Medication Sig   albuterol  (VENTOLIN  HFA) 108 (90 Base) MCG/ACT inhaler Inhale 2 puffs into the lungs every 6 (six) hours as needed for wheezing or shortness of breath.   aspirin EC 81 MG tablet Take 81 mg by mouth daily. Swallow whole.   cholecalciferol (VITAMIN D3) 25 MCG (1000 UNIT) tablet Take 1,000 Units by mouth daily.   cloNIDine  (CATAPRES ) 0.1 MG tablet Take 1 tablet by mouth  twice daily   fluticasone  (FLONASE ) 50 MCG/ACT nasal spray Place 2 sprays into both nostrils daily.   furosemide  (LASIX ) 80 MG tablet Take 1 tablet by mouth once daily   loratadine  (CLARITIN ) 10 MG tablet Take 1 tablet (10 mg total) by mouth daily.   lovastatin  (MEVACOR ) 40 MG tablet Take 1 tablet (40 mg total) by mouth at bedtime.   metoprolol  succinate (TOPROL  XL) 25 MG 24 hr tablet Take 3 tablets (75 mg total) by mouth at bedtime.   Multiple Vitamins-Minerals (MULTIVITAMIN WITH MINERALS) tablet Take 1 tablet by mouth every other day.   spironolactone  (ALDACTONE ) 25 MG tablet Take 0.5 tablets (12.5 mg total) by mouth daily.   traZODone  (DESYREL ) 50 MG tablet Take 1 tablet (50 mg total) by mouth at  bedtime as needed.   [DISCONTINUED] carvedilol  (COREG ) 12.5 MG tablet Take 1 tablet (12.5 mg total) by mouth 2 (two) times daily with a meal.     Allergies:   Diltiazem , Anesthesia s-i-40 [propofol], Codeine, Olmesartan , Naproxen , Norvasc  [amlodipine ], and Tamiflu  [oseltamivir ]   Social History   Socioeconomic History   Marital status: Divorced    Spouse name: Not on file   Number of children: 1   Years of education: 12   Highest education level: 12th grade  Occupational History   Occupation: Retired  Tobacco Use   Smoking status: Never    Passive exposure: Never   Smokeless tobacco: Never  Vaping Use   Vaping status: Never Used  Substance and Sexual Activity   Alcohol use: Never   Drug use: Never   Sexual activity: Not Currently    Partners: Male    Birth control/protection: Surgical  Other Topics Concern   Not on file  Social History Narrative   Lives alone. Has one son - lives in [place].   Social Drivers of Health   Financial Resource Strain: Low Risk  (03/14/2024)   Overall Financial Resource Strain (CARDIA)    Difficulty of Paying Living Expenses: Not hard at all  Food Insecurity: Food Insecurity Present (03/14/2024)   Hunger Vital Sign    Worried About Running Out of Food in the Last Year: Often true    Ran Out of Food in the Last Year: Often true  Transportation Needs: Unmet Transportation Needs (03/14/2024)   PRAPARE - Administrator, Civil Service (Medical): Yes    Lack of Transportation (Non-Medical): Yes  Physical Activity: Insufficiently Active (03/14/2024)   Exercise Vital Sign    Days of Exercise per Week: 5 days    Minutes of Exercise per Session: 20 min  Stress: No Stress Concern Present (03/14/2024)   [employer] of Occupational Health - Occupational Stress Questionnaire    Feeling of Stress : Not at all  Social Connections: Moderately Isolated (03/14/2024)   Social Connection and Isolation Panel    Frequency of Communication with  Friends and Family: More than three times a week    Frequency of Social Gatherings with Friends and Family: More than three times a week    Attends Religious Services: More than 4 times per year    Active Member of [employer] or Organizations: No    Attends Engineer, structural: Never    Marital Status: Divorced     Family History: The patient's family history includes COPD in her brother and brother; Colon cancer in her sister and sister; Diabetes in her brother, mother, and sister; Heart disease in her brother and mother; Hypertension in her brother, brother, father, mother,  sister, and sister; Lung disease in her brother; Pancreatic cancer in her mother; Stroke in her brother and maternal grandmother; Thyroid  disease in her brother and mother. There is no history of Breast cancer.  ROS:   Review of Systems  Constitution: Negative for decreased appetite, fever and weight gain.  HENT: Negative for congestion, ear discharge, hoarse voice and sore throat.   Eyes: Negative for discharge, redness, vision loss in right eye and visual halos.  Cardiovascular: Negative for chest pain, dyspnea on exertion, leg swelling, orthopnea and palpitations.  Respiratory: Negative for cough, hemoptysis, shortness of breath and snoring.   Endocrine: Negative for heat intolerance and polyphagia.  Hematologic/Lymphatic: Negative for bleeding problem. Does not bruise/bleed easily.  Skin: Negative for flushing, nail changes, rash and suspicious lesions.  Musculoskeletal: Negative for arthritis, joint pain, muscle cramps, myalgias, neck pain and stiffness.  Gastrointestinal: Negative for abdominal pain, bowel incontinence, diarrhea and excessive appetite.  Genitourinary: Negative for decreased libido, genital sores and incomplete emptying.  Neurological: Negative for brief paralysis, focal weakness, headaches and loss of balance.  Psychiatric/Behavioral: Negative for altered mental status, depression and  suicidal ideas.  Allergic/Immunologic: Negative for HIV exposure and persistent infections.    EKGs/Labs/Other Studies Reviewed:    The following studies were reviewed today:   EKG:  The ekg ordered today demonstrates   Recent Labs: 04/24/2024: Hemoglobin 12.0; Platelets 308 08/08/2024: ALT 10; BUN 12; Creatinine, Ser 0.61; Magnesium 2.3; Potassium 4.7; Sodium 144; TSH 0.318  Recent Lipid Panel    Component Value Date/Time   CHOL 179 07/04/2024 1009   TRIG 64 07/04/2024 1009   HDL 67 07/04/2024 1009   CHOLHDL 2.7 07/04/2024 1009   LDLCALC 100 (H) 07/04/2024 1009    Physical Exam:    VS:  BP (!) 140/86 (BP Location: Right Arm, Patient Position: Sitting, Cuff Size: Large)   Pulse 64   Ht 5' 10 (1.778 m)   Wt 298 lb 12.8 oz (135.5 kg)   SpO2 96%   BMI 42.87 kg/m     Wt Readings from Last 3 Encounters:  08/08/24 298 lb 12.8 oz (135.5 kg)  07/04/24 (!) 302 lb 1.9 oz (137 kg)  06/26/24 300 lb (136.1 kg)     GEN: Well nourished, well developed in no acute distress HEENT: Normal NECK: No JVD; No carotid bruits LYMPHATICS: No lymphadenopathy CARDIAC: S1S2 noted,RRR, no murmurs, rubs, gallops RESPIRATORY:  Clear to auscultation without rales, wheezing or rhonchi  ABDOMEN: Soft, non-tender, non-distended, +bowel sounds, no guarding. EXTREMITIES: No edema, No cyanosis, no clubbing MUSCULOSKELETAL:  No deformity  SKIN: Warm and dry NEUROLOGIC:  Alert and oriented x 3, non-focal PSYCHIATRIC:  Normal affect, good insight  ASSESSMENT:    1. Medication management   2. Hyperparathyroidism   3. Coronary artery disease involving native coronary artery of native heart without angina pectoris   4. PSVT (paroxysmal supraventricular tachycardia)   5. NSVT (nonsustained ventricular tachycardia) (HCC)   6. Morbid obesity (HCC)     PLAN:    Hypertension-her blood pressure remains elevated but she tells me she is not tolerating the carvedilol  because of the palpitations.  Symptoms  we will go ahead and stop the carvedilol  and start her back on Toprol  XL.  In absence of the carvedilol  I did share with the patient that her blood pressure will be elevated so I am going to add Aldactone  12.5 mg daily.  She will continue clonidine  0.  1 mg daily Toprol -XL will be 75 mg at nighttime.  We talked in depth about her nocturnal palpitations.  I did share with the patient that despite the fact that the metoprolol  was helping the nocturnal palpitation she may not have the same effect with the blood pressure and she at this point wanted to try transitioning off.  There is a lower extremity edema no changes in her furosemide .  CAD on coronary CTA no anginal symptoms continue aspirin and statin.  We will repeat a lipid profile in 12 weeks should her LDL not be less than 70 will adjust by adding Zetia or changing to Crestor   The patient is in agreement with the above plan. The patient left the office in stable condition.  The patient will follow up in   Medication Adjustments/Labs and Tests Ordered: Current medicines are reviewed at length with the patient today.  Concerns regarding medicines are outlined above.  Orders Placed This Encounter  Procedures   Comp Met (CMET)   Magnesium   TSH+T4F+T3Free   Meds ordered this encounter  Medications   metoprolol  succinate (TOPROL  XL) 25 MG 24 hr tablet    Sig: Take 3 tablets (75 mg total) by mouth at bedtime.    Dispense:  270 tablet    Refill:  3   spironolactone  (ALDACTONE ) 25 MG tablet    Sig: Take 0.5 tablets (12.5 mg total) by mouth daily.    Dispense:  45 tablet    Refill:  3    Patient Instructions  Medication Instructions:  Your physician has recommended you make the following change in your medication:  STOP: Flecainide   STOP: Coreg   START: Toprol -XL 75 mg (three 25 mg tablets)  nightly START: Aldactone  12.5 mg (half tablet) once daily  *If you need a refill on your cardiac medications before your next appointment,  please call your pharmacy*  Lab Work: CMET, Mag, TSH If you have labs (blood work) drawn today and your tests are completely normal, you will receive your results only by: MyChart Message (if you have MyChart) OR A paper copy in the mail If you have any lab test that is abnormal or we need to change your treatment, we will call you to review the results.  Follow-Up: At [facility], you and your health needs are our priority.  As part of our continuing mission to provide you with exceptional heart care, our providers are all part of one team.  This team includes your primary Cardiologist (physician) and Advanced Practice Providers or APPs (Physician Assistants and Nurse Practitioners) who all work together to provide you with the care you need, when you need it.  Your next appointment:   12 week(s)  Provider:   Carrell Palmatier, DO              Adopting a Healthy Lifestyle.  Know what a healthy weight is for you (roughly BMI <25) and aim to maintain this   Aim for 7+ servings of fruits and vegetables daily   65-80+ fluid ounces of water or unsweet tea for healthy kidneys   Limit to max 1 drink of alcohol per day; avoid smoking/tobacco   Limit animal fats in diet for cholesterol and heart health - choose grass fed whenever available   Avoid highly processed foods, and foods high in saturated/trans fats   Aim for low stress - take time to unwind and care for your mental health   Aim for 150 min of moderate intensity exercise weekly for heart health, and weights twice weekly for bone health  Aim for 7-9 hours of sleep daily   When it comes to diets, agreement about the perfect plan isnt easy to find, even among the experts. Experts at the [facility] [employer] developed an idea known as the Healthy Eating Plate. Just imagine a plate divided into logical, healthy portions.   The emphasis is on diet quality:   Load up on vegetables and fruits - one-half of  your plate: Aim for color and variety, and remember that potatoes dont count.   Go for whole grains - one-quarter of your plate: Whole wheat, barley, wheat berries, quinoa, oats, brown rice, and foods made with them. If you want pasta, go with whole wheat pasta.   Protein power - one-quarter of your plate: Fish, chicken, beans, and nuts are all healthy, versatile protein sources. Limit red meat.   The diet, however, does go beyond the plate, offering a few other suggestions.   Use healthy plant oils, such as olive, canola, soy, corn, sunflower and peanut. Check the labels, and avoid partially hydrogenated oil, which have unhealthy trans fats.   If youre thirsty, drink water. Coffee and tea are good in moderation, but skip sugary drinks and limit milk and dairy products to one or two daily servings.   The type of carbohydrate in the diet is more important than the amount. Some sources of carbohydrates, such as vegetables, fruits, whole grains, and beans-are healthier than others.   Finally, stay active  Signed, Dub Huntsman, DO  08/10/2024 10:59 AM    [facility]

## 2024-08-09 DIAGNOSIS — I160 Hypertensive urgency: Secondary | ICD-10-CM | POA: Diagnosis not present

## 2024-08-09 DIAGNOSIS — I10 Essential (primary) hypertension: Secondary | ICD-10-CM | POA: Diagnosis not present

## 2024-08-09 DIAGNOSIS — R42 Dizziness and giddiness: Secondary | ICD-10-CM | POA: Diagnosis not present

## 2024-08-09 LAB — COMPREHENSIVE METABOLIC PANEL WITH GFR
ALT: 10 IU/L (ref 0–32)
AST: 20 IU/L (ref 0–40)
Albumin: 4.5 g/dL (ref 3.9–4.9)
Alkaline Phosphatase: 190 IU/L — ABNORMAL HIGH (ref 49–135)
BUN/Creatinine Ratio: 20 (ref 12–28)
BUN: 12 mg/dL (ref 8–27)
Bilirubin Total: 0.3 mg/dL (ref 0.0–1.2)
CO2: 28 mmol/L (ref 20–29)
Calcium: 10.6 mg/dL — ABNORMAL HIGH (ref 8.7–10.3)
Chloride: 106 mmol/L (ref 96–106)
Creatinine, Ser: 0.61 mg/dL (ref 0.57–1.00)
Globulin, Total: 2.7 g/dL (ref 1.5–4.5)
Glucose: 108 mg/dL — ABNORMAL HIGH (ref 70–99)
Potassium: 4.7 mmol/L (ref 3.5–5.2)
Sodium: 144 mmol/L (ref 134–144)
Total Protein: 7.2 g/dL (ref 6.0–8.5)
eGFR: 97 mL/min/1.73 (ref >59)

## 2024-08-09 LAB — TSH+T4F+T3FREE
Free T4: 1.15 ng/dL (ref 0.82–1.77)
T3, Free: 3 pg/mL (ref 2.0–4.4)
TSH: 0.318 u[IU]/mL — ABNORMAL LOW (ref 0.450–4.500)

## 2024-08-09 LAB — MAGNESIUM: Magnesium: 2.3 mg/dL (ref 1.6–2.3)

## 2024-08-10 ENCOUNTER — Telehealth: Payer: Self-pay | Admitting: Cardiology

## 2024-08-10 MED ORDER — CLONIDINE HCL 0.2 MG PO TABS: 0.2000 mg | ORAL_TABLET | Freq: Two times a day (BID) | ORAL | 1 refills | Status: AC

## 2024-08-10 MED ORDER — CARVEDILOL 12.5 MG PO TABS: 12.5000 mg | ORAL_TABLET | Freq: Two times a day (BID) | ORAL | 1 refills | Status: AC

## 2024-08-10 NOTE — Telephone Encounter (Signed)
 Tobb, Kardie, DO to [facility] Triage  Melida Rolin HERO, RN    Thanks for the update.  On Monday we stopped the carvedilol  because she tells me she was experiencing significant palpitation and she could not tolerate this I also restarted the Toprol  XL 75 mg and because of the the suspicion that she may be hypertensive I added Aldactone  12.5 mg to her regimen and continue the clonidine . Our options for her is to go back to carvedilol  and tolerate some palpitations at night which I will be happy to restart her on carvedilol  12.5 mg twice daily. If she does not want to do that we can go ahead and increase the Aldactone  to 25 mg daily and increase the clonidine  to 0.2 mg daily.  Tobb, Kardie, DO to [facility] Triage  Melida Rolin HERO, RN  (Selected Message)   Actually increase the clonidine  to help 0.2 mg twice a day   Spoke with pt regarding Dr. Ginette recommendations. Pt does want to switch back to Carvedilol  12.5mg  twice daily. She understands that she may have nocturnal palpitations and she is ok to move forward with this switch. Pt will continue spironalactone 12.5mg  once daily. We will increase clonidine  to 0.2mg  twice daily. Pt repeats this back for clarity, she also asks that I send a mychart message with updated instructions for her medications. Pt will continue to keep a blood log. Pt verbalizes understanding.

## 2024-08-10 NOTE — Telephone Encounter (Signed)
 Pt c/o BP issue: STAT if pt c/o blurred vision, one-sided weakness or slurred speech.  STAT if BP is GREATER than 180/120 TODAY.  STAT if BP is LESS than 90/60 and SYMPTOMATIC TODAY  1. What is your BP concern? BP elevated   2. Have you taken any BP medication today?Yes  3. What are your last 5 BP readings?180/90  4. Are you having any other symptoms (ex. Dizziness, headache, blurred vision, passed out)? No   Pt states this is coming from the medication changes at her last appt and the pt did go to the ED last night.

## 2024-08-10 NOTE — Telephone Encounter (Signed)
 Spoke with pt regarding her elevated blood pressures that she has been experiencing since last night.  Pt was recently seen in the office on 9/24 with multiple medications changes made. Blood pressure readings last night were 200s/100s (multiple readings) and 190/92. Pt had a headache and was very dizzy. Pt did go to the [facility] ED last night.  Pt is currently taking: Clonidine  0.1mg  BID Spironolactone  12.5mg  daily Metoprolol  succinate 75mg  daily HS Lasix  80mg  daily  Note from 9/24 does not reflect medication changes:  PLAN:     Paroxysmal Supraventricular Tachycardia (PSVT)/ Noted beats of NSVT on zio monitor  Intermittent palpitations with heart rates up to 193 bpm. Previous beta blocker therapy caused pauses. Current symptoms align with NSVT  timing on heart monitor. -  has not tolerate cardizem  and metoprolol , Initiate flecainide  twice daily. - Continue carvedilol .   Intermittent fatigue - need to rule our CAD especillay with risk factors and symptoms then now in the presence of flecainide  use.    Intermittent chest discomfort with her risk factors.  Will order CCTA.  The symptoms chest pain is concerning, this patient does have intermediate risk for coronary artery disease and at this time I would like to pursue an ischemic evaluation in this patient.  Shared decision a coronary CTA at this time is appropriate.  I have discussed with the patient about the testing.  The patient has no IV contrast allergy and is agreeable to proceed with this test.   - Schedule follow-up EKG in 16 weeks.   Hypertension Blood pressure 130-140/80 mmHg. Clonidine  effective without worsening palpitations. - Resume clonidine  0.1 mg twice daily. - Monitor blood pressure daily and record in MyChart after two weeks. - Consult pharmacist for medication options aiding sleep apnea and blood pressure control.   This morning blood pressure is still elevated at 173/92 at 8:30am. Heart rates have been in the 60s  and 70s. Pt was instructed to let us  know if she has any issues with medication changes and that is why she is calling us  this morning. Will forward to provider to advise.

## 2024-08-14 ENCOUNTER — Ambulatory Visit: Payer: Self-pay | Admitting: Cardiology

## 2024-08-16 ENCOUNTER — Ambulatory Visit: Payer: Self-pay

## 2024-08-16 NOTE — Telephone Encounter (Signed)
 FYI Only or Action Required?: Action required by provider: request for appointment.  Patient was last seen in primary care on 07/04/2024 by Terry Wilhelmena [name], [name].  Called Nurse Triage reporting Palpitations.  Symptoms began several weeks ago.  Interventions attempted: Other: Er evaluation.  Symptoms are: unchanged.  Triage Disposition: See PCP When Office is Open (Within 3 Days)  Patient/caregiver understands and will follow disposition?: Unsure     Copied from CRM [id number]. Topic: Clinical - Pink Word Triage >> Aug 16, 2024  3:31 PM Larissa S wrote: Reason for Triage: Heart palpitations    ----------------------------------------------------------------------- From previous Reason for Contact - Scheduling: Patient/patient representative is calling to schedule an appointment. Refer to attachments for appointment information. Reason for Disposition  [1] Palpitations AND [2] no improvement after using Care Advice  Answer Assessment - Initial Assessment Questions Additional info: Patient was evaluated at [facility] ED on 08/09/24 for hypertension and dizziness. She was informed by ER doctor that her TSH is low which could be the cause of palpations and follow up with your doctor. Patient continues to experience heart palpitation and requesting follow up visit. No hospital follow up visit are available until 08/30/24, protocol indicated follow up needed within 3 days,  patient requesting sooner hospital follow up visit.  Please follow up with patient, she will check her MyChart messages for correspondence.    1. DESCRIPTION: Please describe your heart rate or heartbeat that you are having (e.g., fast/slow, regular/irregular, skipped or extra beats, palpitations)     Feels palpations 2. ONSET: When did it start? (e.g., minutes, hours, days)      Several weeks 3. DURATION: How long does it last (e.g., seconds, minutes, hours)     Several minutes  4. PATTERN Does  it come and go, or has it been constant since it started?  Does it get worse with exertion?   Are you feeling it now?     Intermittent  5. TAP: Using your hand, can you tap out what you are feeling on a chair or table in front of you, so that I can hear? Note: Not all patients can do this.        6. HEART RATE: Can you tell me your heart rate? How many beats in 15 seconds?  Note: Not all patients can do this.       67 7. RECURRENT SYMPTOM: Have you ever had this before? If Yes, ask: When was the last time? and What happened that time?      Ongoing  8. CAUSE: What do you think is causing the palpitations?     Was told at ER her TSH low to follow up with PCP.   9. CARDIAC HISTORY: Do you have any history of heart disease? (e.g., heart attack, angina, bypass surgery, angioplasty, arrhythmia)      Hypertension current bp 132/80 10. OTHER SYMPTOMS: Do you have any other symptoms? (e.g., dizziness, chest pain, sweating, difficulty breathing)       Denies  Protocols used: Heart Rate and Heartbeat Questions-A-AH

## 2024-08-27 ENCOUNTER — Ambulatory Visit (INDEPENDENT_AMBULATORY_CARE_PROVIDER_SITE_OTHER): Payer: Self-pay | Admitting: Internal Medicine

## 2024-08-27 ENCOUNTER — Encounter: Payer: Self-pay | Admitting: Internal Medicine

## 2024-08-27 ENCOUNTER — Other Ambulatory Visit: Payer: Self-pay | Admitting: Family Medicine

## 2024-08-27 VITALS — BP 124/79 | HR 73 | Ht 70.0 in | Wt 299.6 lb

## 2024-08-27 DIAGNOSIS — E213 Hyperparathyroidism, unspecified: Secondary | ICD-10-CM

## 2024-08-27 DIAGNOSIS — R7989 Other specified abnormal findings of blood chemistry: Secondary | ICD-10-CM | POA: Diagnosis not present

## 2024-08-27 DIAGNOSIS — I10 Essential (primary) hypertension: Secondary | ICD-10-CM | POA: Diagnosis not present

## 2024-08-27 DIAGNOSIS — Z09 Encounter for follow-up examination after completed treatment for conditions other than malignant neoplasm: Secondary | ICD-10-CM | POA: Diagnosis not present

## 2024-08-27 DIAGNOSIS — I471 Supraventricular tachycardia, unspecified: Secondary | ICD-10-CM | POA: Diagnosis not present

## 2024-08-27 NOTE — Progress Notes (Signed)
 Acute Office Visit  Subjective:    Patient ID: Kristine Mata, female    DOB: 08-Feb-1956, 68 y.o.   MRN: [id number]  Chief Complaint  Patient presents with   Palpitations    Pt reports heart palpitations at night. States tsh level is low.     HPI Patient is in today for discussion of low TSH recently - 0.318.  She went to her cardiologist for routine follow-up om 08/08/24, had low TSH in the blood work.  She was advised to follow-up with PCP for it.  Of note, patient is currently followed by Endocrinology for primary hyperparathyroidism.  She is undergoing evaluation for parathyroid  surgery, but she is thinking about it.  She went to ER on 08/09/24 with complaint of dizziness and elevated pressure.  Of note, she had restarted taking metoprolol  instead of carvedilol  after her cardiology visit due to palpitations, which led to elevated BP.  After her ER visit, she has restarted taking carvedilol , spironolactone  and clonidine .  Her BP was WNL  She reports intermittent palpitations.  She is currently taking carvedilol  12.5 mg BID.  Past Medical History:  Diagnosis Date   Ectopic pregnancy    Heart murmur    Hyperlipidemia    Hypertension    Insomnia, unspecified    OSA on CPAP    moderate OSA with an AHI of 14.8/hr on auto CPAP   Osteopenia 03/12/2021   Right knee pain    SVT (supraventricular tachycardia)     Past Surgical History:  Procedure Laterality Date   ABDOMINAL HYSTERECTOMY     BREAST CYST EXCISION Left    68 years old   COLONOSCOPY  2016   [facility]; 1 tubular adenoma, 1 hyperplastic polyp, TI biopsy with reactive lymphoid hyperplasia without active or chronic enteritis.   COLONOSCOPY N/A 12/02/2021   Procedure: COLONOSCOPY;  Surgeon: Shaaron Lamar HERO, MD;  Location: [facility];  Service: Endoscopy;  Laterality: N/A;  7:30am   cyst left breast      SALPINGECTOMY Left    TMJ ARTHROPLASTY      Family History  Problem Relation Age of Onset   Diabetes Mother     Heart disease Mother        used nitroglycerin    Hypertension Mother    Thyroid  disease Mother    Pancreatic cancer Mother    Hypertension Father    Diabetes Sister    Hypertension Sister    Colon cancer Sister        78   Hypertension Sister    Colon cancer Sister        1s   Hypertension Brother    Thyroid  disease Brother    Diabetes Brother    Hypertension Brother    COPD Brother    Heart disease Brother    Stroke Brother    COPD Brother    Lung disease Brother    Stroke Maternal Grandmother    Breast cancer Neg Hx     Social History   Socioeconomic History   Marital status: Divorced    Spouse name: Not on file   Number of children: 1   Years of education: 12   Highest education level: 12th grade  Occupational History   Occupation: Retired  Tobacco Use   Smoking status: Never    Passive exposure: Never   Smokeless tobacco: Never  Vaping Use   Vaping status: Never Used  Substance and Sexual Activity   Alcohol use: Never   Drug use: Never  Sexual activity: Not Currently    Partners: Male    Birth control/protection: Surgical  Other Topics Concern   Not on file  Social History Narrative   Lives alone. Has one son - lives in [place].   Social Drivers of Health   Financial Resource Strain: Low Risk  (03/14/2024)   Overall Financial Resource Strain (CARDIA)    Difficulty of Paying Living Expenses: Not hard at all  Food Insecurity: Food Insecurity Present (03/14/2024)   Hunger Vital Sign    Worried About Running Out of Food in the Last Year: Often true    Ran Out of Food in the Last Year: Often true  Transportation Needs: Unmet Transportation Needs (03/14/2024)   PRAPARE - Administrator, Civil Service (Medical): Yes    Lack of Transportation (Non-Medical): Yes  Physical Activity: Insufficiently Active (03/14/2024)   Exercise Vital Sign    Days of Exercise per Week: 5 days    Minutes of Exercise per Session: 20 min  Stress: No Stress Concern  Present (03/14/2024)   [employer] of Occupational Health - Occupational Stress Questionnaire    Feeling of Stress : Not at all  Social Connections: Moderately Isolated (03/14/2024)   Social Connection and Isolation Panel    Frequency of Communication with Friends and Family: More than three times a week    Frequency of Social Gatherings with Friends and Family: More than three times a week    Attends Religious Services: More than 4 times per year    Active Member of [employer] or Organizations: No    Attends Banker Meetings: Never    Marital Status: Divorced  Catering manager Violence: Not At Risk (03/14/2024)   Humiliation, Afraid, Rape, and Kick questionnaire    Fear of Current or Ex-Partner: No    Emotionally Abused: No    Physically Abused: No    Sexually Abused: No    Outpatient Medications Prior to Visit  Medication Sig Dispense Refill   albuterol  (VENTOLIN  HFA) 108 (90 Base) MCG/ACT inhaler Inhale 2 puffs into the lungs every 6 (six) hours as needed for wheezing or shortness of breath. 8 g 3   aspirin EC 81 MG tablet Take 81 mg by mouth daily. Swallow whole.     carvedilol  (COREG ) 12.5 MG tablet Take 1 tablet (12.5 mg total) by mouth 2 (two) times daily. 180 tablet 1   cholecalciferol (VITAMIN D3) 25 MCG (1000 UNIT) tablet Take 1,000 Units by mouth daily.     cloNIDine  (CATAPRES ) 0.2 MG tablet Take 1 tablet (0.2 mg total) by mouth 2 (two) times daily. 180 tablet 1   fluticasone  (FLONASE ) 50 MCG/ACT nasal spray Place 2 sprays into both nostrils daily. 48 g 1   loratadine  (CLARITIN ) 10 MG tablet Take 1 tablet (10 mg total) by mouth daily. 90 tablet 1   lovastatin  (MEVACOR ) 40 MG tablet Take 1 tablet (40 mg total) by mouth at bedtime. 90 tablet 1   Multiple Vitamins-Minerals (MULTIVITAMIN WITH MINERALS) tablet Take 1 tablet by mouth every other day.     spironolactone  (ALDACTONE ) 25 MG tablet Take 0.5 tablets (12.5 mg total) by mouth daily. 45 tablet 3   traZODone   (DESYREL ) 50 MG tablet Take 1 tablet (50 mg total) by mouth at bedtime as needed. 30 tablet 3   furosemide  (LASIX ) 80 MG tablet Take 1 tablet by mouth once daily 90 tablet 0   No facility-administered medications prior to visit.    Allergies  Allergen Reactions  Diltiazem  Rash   Anesthesia S-I-40 [Propofol] Nausea And Vomiting   Codeine Nausea And Vomiting   Olmesartan      Edema in feet   Naproxen  Palpitations   Norvasc  [Amlodipine ] Other (See Comments)    Edema at 2.5 mg dosage.   Tamiflu  [Oseltamivir ] Rash and Cough    Rash, Cough, and Wheezing    Review of Systems  Constitutional:  Negative for chills and fever.  HENT:  Negative for congestion and sore throat.   Eyes:  Negative for pain and discharge.  Respiratory:  Negative for cough and shortness of breath.   Cardiovascular:  Positive for palpitations. Negative for chest pain.  Gastrointestinal:  Negative for abdominal pain, diarrhea, nausea and vomiting.  Endocrine: Negative for polydipsia and polyuria.  Genitourinary:  Negative for dysuria and hematuria.  Musculoskeletal:  Negative for neck pain and neck stiffness.  Skin:  Negative for rash.  Neurological:  Negative for dizziness and weakness.  Psychiatric/Behavioral:  Negative for agitation and behavioral problems.        Objective:    Physical Exam Vitals reviewed.  Constitutional:      General: She is not in acute distress.    Appearance: She is not diaphoretic.  HENT:     Head: Normocephalic and atraumatic.     Nose: Nose normal.     Mouth/Throat:     Mouth: Mucous membranes are moist.  Eyes:     General: No scleral icterus.    Extraocular Movements: Extraocular movements intact.  Cardiovascular:     Rate and Rhythm: Normal rate and regular rhythm.     Heart sounds: Normal heart sounds. No murmur heard. Pulmonary:     Breath sounds: Normal breath sounds. No wheezing or rales.  Musculoskeletal:     Cervical back: Neck supple. No tenderness.      Right lower leg: No edema.     Left lower leg: No edema.  Skin:    General: Skin is warm.     Findings: No rash.  Neurological:     General: No focal deficit present.     Mental Status: She is alert and oriented to person, place, and time.  Psychiatric:        Mood and Affect: Mood normal.        Behavior: Behavior is cooperative.     BP 124/79   Pulse 73   Ht 5' 10 (1.778 m)   Wt 299 lb 9.6 oz (135.9 kg)   SpO2 95%   BMI 42.99 kg/m  Wt Readings from Last 3 Encounters:  08/27/24 299 lb 9.6 oz (135.9 kg)  08/08/24 298 lb 12.8 oz (135.5 kg)  07/04/24 (!) 302 lb 1.9 oz (137 kg)        Assessment & Plan:   Problem List Items Addressed This Visit       Cardiovascular and Mediastinum   Hypertension (Chronic)   BP Readings from Last 1 Encounters:  08/27/24 124/79   Well-controlled with carvedilol  12.5 mg BID, spironolactone  5 mg QD and clonidine  0.2 mg BID Counseled for compliance with the medications Advised DASH diet and moderate exercise/walking, at least 150 mins/week      PSVT (paroxysmal supraventricular tachycardia) - Primary   Has intermittent palpitations On carvedilol  12.5 mg twice daily Followed by cardiology        Endocrine   Hyperparathyroidism   Followed by endocrinology She has been advised for parathyroid  surgery, but she is thinking about it  Other   Low TSH level   Lab Results  Component Value Date   TSH 0.318 (L) 08/08/2024   Will check TSH, free T4 and T3 after 3 weeks Advised to contact her endocrinology as well      Relevant Orders   TSH+T4F+T3Free   Encounter for examination following treatment at hospital   Recent ER visit chart reviewed, including blood tests Her dizziness and hypertensive urgency, resolved now        No orders of the defined types were placed in this encounter.    Suzzane MARLA Blanch, MD

## 2024-08-27 NOTE — Patient Instructions (Signed)
 Please get repeat thyroid  function test after 3 weeks.  Please continue to take medications as prescribed.  Please continue to follow low salt diet and ambulate as tolerated.

## 2024-08-30 ENCOUNTER — Ambulatory Visit: Payer: Self-pay | Admitting: Internal Medicine

## 2024-09-02 DIAGNOSIS — Z09 Encounter for follow-up examination after completed treatment for conditions other than malignant neoplasm: Secondary | ICD-10-CM | POA: Insufficient documentation

## 2024-09-02 DIAGNOSIS — R7989 Other specified abnormal findings of blood chemistry: Secondary | ICD-10-CM | POA: Insufficient documentation

## 2024-09-02 NOTE — Assessment & Plan Note (Signed)
 BP Readings from Last 1 Encounters:  08/27/24 124/79   Well-controlled with carvedilol  12.5 mg BID, spironolactone  5 mg QD and clonidine  0.2 mg BID Counseled for compliance with the medications Advised DASH diet and moderate exercise/walking, at least 150 mins/week

## 2024-09-02 NOTE — Assessment & Plan Note (Signed)
 Lab Results  Component Value Date   TSH 0.318 (L) 08/08/2024   Will check TSH, free T4 and T3 after 3 weeks Advised to contact her endocrinology as well

## 2024-09-02 NOTE — Assessment & Plan Note (Signed)
 Has intermittent palpitations On carvedilol  12.5 mg twice daily Followed by cardiology

## 2024-09-02 NOTE — Assessment & Plan Note (Signed)
 Recent ER visit chart reviewed, including blood tests Her dizziness and hypertensive urgency, resolved now

## 2024-09-02 NOTE — Assessment & Plan Note (Signed)
 Followed by endocrinology She has been advised for parathyroid  surgery, but she is thinking about it

## 2024-09-17 ENCOUNTER — Encounter: Payer: Self-pay | Admitting: Radiology

## 2024-09-18 ENCOUNTER — Ambulatory Visit

## 2024-09-27 LAB — TSH+T4F+T3FREE
Free T4: 1.09 ng/dL (ref 0.82–1.77)
T3, Free: 3 pg/mL (ref 2.0–4.4)
TSH: 0.529 u[IU]/mL (ref 0.450–4.500)

## 2024-10-01 ENCOUNTER — Telehealth: Payer: Self-pay

## 2024-10-01 NOTE — Telephone Encounter (Signed)
 Copied from CRM #[id number]. Topic: Clinical - Medical Advice >> Oct 01, 2024  9:42 AM Kristine Mata wrote: Reason for CRM: Pt received her thyroid  level are low. She is wanting to know what she is suppose to do moving forward? Should she be taking for the low levels. Please advise #[phone]

## 2024-10-02 NOTE — Telephone Encounter (Signed)
 Patient advised.

## 2024-10-03 ENCOUNTER — Inpatient Hospital Stay: Admission: RE | Admit: 2024-10-03 | Source: Ambulatory Visit

## 2024-10-31 ENCOUNTER — Encounter

## 2024-11-02 ENCOUNTER — Ambulatory Visit: Payer: Self-pay

## 2024-11-02 NOTE — Telephone Encounter (Signed)
 Noted

## 2024-11-02 NOTE — Telephone Encounter (Signed)
 FYI Only or Action Required?: FYI only for provider: appointment scheduled on 12/22.  Patient was last seen in primary care on 08/27/2024 by Tobie Suzzane POUR, MD.  Called Nurse Triage reporting Headache, Nasal Congestion, and Otalgia.  Symptoms began 2 days ago.  Interventions attempted: OTC medications: tylenol, allergy pill, flonase .  Symptoms are: unchanged.  Triage Disposition: See Physician Within 24 Hours  Patient/caregiver understands and will follow disposition?: No, wishes to speak with PCP   Copied from CRM #[id number]. Topic: Clinical - Red Word Triage >> Nov 02, 2024  2:16 PM Hadassah PARAS wrote: Red Word that prompted transfer to Nurse Triage: Pt is calling to check why her app for 12/22 was changed to virtual instread of in office. Advised [facility] is aslo on leave and can only do virtual visits. Pt mentioned she is having worsening headache and ear pain. Hard scabby skin on left ear. Sore in nose. Transferred to NT Reason for Disposition  [1] MODERATE headache (e.g., interferes with normal activities) AND [2] present > 24 hours AND [3] unexplained  (Exceptions: Pain medicines not tried, typical migraine, or headache part of viral illness.)  Answer Assessment - Initial Assessment Questions 1. LOCATION: Where does it hurt?      Left sided headache  2. ONSET: When did the headache start? (e.g., minutes, hours, days)      2 days ago  3. PATTERN: Does the pain come and go, or has it been constant since it started?     Intermittent   4. SEVERITY: How bad is the pain? and What does it keep you from doing?  (e.g., Scale 1-10; mild, moderate, or severe)     7/10 at this time - patient states tylenol not improving pain  5. RECURRENT SYMPTOM: Have you ever had headaches before? If Yes, ask: When was the last time? and What happened that time?      Denies   8. HEAD INJURY: Has there been any recent injury to your head?      Denies  9. OTHER SYMPTOMS: Do you have  any other symptoms? (e.g., fever, stiff neck, eye pain, sore throat, cold symptoms)     L sided earache, L sided headache, sore in nose, runny eyes, congested. Flonase  and an allergy pill  Protocols used: Headache-A-AH

## 2024-11-05 ENCOUNTER — Encounter: Payer: Self-pay | Admitting: Family Medicine

## 2024-11-05 ENCOUNTER — Telehealth (INDEPENDENT_AMBULATORY_CARE_PROVIDER_SITE_OTHER): Admitting: Family Medicine

## 2024-11-05 DIAGNOSIS — J324 Chronic pansinusitis: Secondary | ICD-10-CM

## 2024-11-05 MED ORDER — LEVOCETIRIZINE DIHYDROCHLORIDE 5 MG PO TABS: 5.0000 mg | ORAL_TABLET | Freq: Every evening | ORAL | 1 refills | Status: AC

## 2024-11-05 MED ORDER — MONTELUKAST SODIUM 10 MG PO TABS: 10.0000 mg | ORAL_TABLET | Freq: Every day | ORAL | 3 refills | Status: AC

## 2024-11-05 NOTE — Progress Notes (Signed)
 "  Virtual Visit via Video Note  I connected with Kristine Mata on 11/05/2024 at  9:40 AM EST by a video enabled telemedicine application and verified that I am speaking with the correct person using two identifiers.  Patient Location: Home Provider Location: Home Office  I discussed the limitations, risks, security, and privacy concerns of performing an evaluation and management service by video and the availability of in person appointments. I also discussed with the patient that there may be a patient responsible charge related to this service. The patient expressed understanding and agreed to proceed.  Subjective: PCP: Terry Wilhelmena [name], [name]  Chief Complaint  Patient presents with   Medical Management of Chronic Issues    4 month follow up    Headache    Pt complains of headache. Sinus pressure, drainage, and stopped up ears x3 days    HPI The patient reports recurrent episodes of viral sinusitis, noting that symptoms predominantly affect the left nostril. She denies fever or chills. She reports associated headaches, sinus pressure, and nasal drainage. The symptoms are intermittent and have been present for approximately three days. She also notes increased difficulty breathing at night during these episodes.  ROS: Per HPI Current Medications[1]  Observations/Objective: There were no vitals filed for this visit. Physical Exam Patient is well-developed, well-nourished in no acute distress.  Resting comfortably at home.  Head is normocephalic, atraumatic.  No labored breathing.  Speech is clear and coherent with logical content.  Patient is alert and oriented at baseline.   Assessment and Plan: Chronic pansinusitis -     Ambulatory referral to ENT -     Levocetirizine Dihydrochloride ; Take 1 tablet (5 mg total) by mouth every evening.  Dispense: 30 tablet; Refill: 1 -     Montelukast  Sodium; Take 1 tablet (10 mg total) by mouth at bedtime.  Dispense: 30 tablet;  Refill: 3  The patient was encouraged to continue symptomatic treatment, including increased hydration and taking Tylenol as needed. She was also encouraged to take over-the-counter Mucinex  Sinus.   Follow Up Instructions: No follow-ups on file.   I discussed the assessment and treatment plan with the patient. The patient was provided an opportunity to ask questions, and all were answered. The patient agreed with the plan and demonstrated an understanding of the instructions.   The patient was advised to call back or seek an in-person evaluation if the symptoms worsen or if the condition fails to improve as anticipated.  The above assessment and management plan was discussed with the patient. The patient verbalized understanding of and has agreed to the management plan.   Kayti Poss  Z Bacchus, [name]     [1]  Current Outpatient Medications:    albuterol  (VENTOLIN  HFA) 108 (90 Base) MCG/ACT inhaler, Inhale 2 puffs into the lungs every 6 (six) hours as needed for wheezing or shortness of breath., Disp: 8 g, Rfl: 3   aspirin EC 81 MG tablet, Take 81 mg by mouth daily. Swallow whole., Disp: , Rfl:    carvedilol  (COREG ) 12.5 MG tablet, Take 1 tablet (12.5 mg total) by mouth 2 (two) times daily., Disp: 180 tablet, Rfl: 1   cholecalciferol (VITAMIN D3) 25 MCG (1000 UNIT) tablet, Take 1,000 Units by mouth daily., Disp: , Rfl:    cloNIDine  (CATAPRES ) 0.2 MG tablet, Take 1 tablet (0.2 mg total) by mouth 2 (two) times daily., Disp: 180 tablet, Rfl: 1   fluticasone  (FLONASE ) 50 MCG/ACT nasal spray, Place 2 sprays into both  nostrils daily., Disp: 48 g, Rfl: 1   furosemide  (LASIX ) 80 MG tablet, Take 1 tablet by mouth once daily, Disp: 90 tablet, Rfl: 0   levocetirizine (XYZAL ) 5 MG tablet, Take 1 tablet (5 mg total) by mouth every evening., Disp: 30 tablet, Rfl: 1   lovastatin  (MEVACOR ) 40 MG tablet, Take 1 tablet (40 mg total) by mouth at bedtime., Disp: 90 tablet, Rfl: 1   montelukast  (SINGULAIR ) 10 MG  tablet, Take 1 tablet (10 mg total) by mouth at bedtime., Disp: 30 tablet, Rfl: 3   Multiple Vitamins-Minerals (MULTIVITAMIN WITH MINERALS) tablet, Take 1 tablet by mouth every other day., Disp: , Rfl:    spironolactone  (ALDACTONE ) 25 MG tablet, Take 0.5 tablets (12.5 mg total) by mouth daily., Disp: 45 tablet, Rfl: 3   traZODone  (DESYREL ) 50 MG tablet, Take 1 tablet (50 mg total) by mouth at bedtime as needed., Disp: 30 tablet, Rfl: 3  "

## 2024-11-06 ENCOUNTER — Ambulatory Visit: Attending: Cardiology | Admitting: Cardiology

## 2024-11-06 ENCOUNTER — Encounter: Payer: Self-pay | Admitting: Cardiology

## 2024-11-06 ENCOUNTER — Ambulatory Visit

## 2024-11-06 VITALS — BP 136/86 | HR 77 | Ht 70.0 in | Wt 298.8 lb

## 2024-11-06 DIAGNOSIS — R0609 Other forms of dyspnea: Secondary | ICD-10-CM

## 2024-11-06 DIAGNOSIS — R002 Palpitations: Secondary | ICD-10-CM | POA: Diagnosis not present

## 2024-11-06 DIAGNOSIS — I471 Supraventricular tachycardia, unspecified: Secondary | ICD-10-CM | POA: Diagnosis not present

## 2024-11-06 DIAGNOSIS — I2510 Atherosclerotic heart disease of native coronary artery without angina pectoris: Secondary | ICD-10-CM | POA: Diagnosis not present

## 2024-11-06 DIAGNOSIS — G4733 Obstructive sleep apnea (adult) (pediatric): Secondary | ICD-10-CM | POA: Diagnosis not present

## 2024-11-06 DIAGNOSIS — I4729 Other ventricular tachycardia: Secondary | ICD-10-CM

## 2024-11-06 NOTE — Progress Notes (Signed)
 " Cardiology Office Note:    Date:  11/06/2024   ID:  Kristine Mata, DOB [birth year], MRN [id number]  PCP:  Terry Wilhelmena [name], [name]  Cardiologist:  Dub Huntsman, DO  Electrophysiologist:  None   Referring MD: Terry Wilhelmena [name], [name]    I have been having chest pain  History of Present Illness:    Kristine Mata is a 68 y.o. female with a hx of Hypertension, Hyperlipidemia, PSVT, 5 beats of NSVT noted in the overnight hours on a zio monitor, OSA not on her CPAP.  She was ordered CPAP years ago which she returned due to not using it.  She shares that the CPAP makes her mouth dry  She has seen Dr, Mallipeddi and was last seen on 02/23/2024. During that visit she was started on Amlodipine  2.5 mg daily. She had reported intolerance to Cardizem . Coreg  was continued.   Of note she has had to issues with her medications - transitioned from metorprolol to coreg  due to need for better blood pressure control and asymptomatic second degree AV block.    At her visit which was on April 24, 2024 at that time we talked about her medication she was on carvedilol  which I continue, initiated flecainide  because of her paroxysmal SVT and NSVT.  We also ordered a coronary CTA which did not show any evidence of significant blockage.  We continued her clonidine  0.1 mg twice a day.  She was seen on August 08, 2024 at that time she shared that she had stopped the flecainide  reporting significant fatigue from the medication.  She was concerned for more palpitations therefore we stopped carvedilol  and started Toprol  XL. Few days later she called worried that she is not feeling better on the metoprolol  and wanted to get back on carvedilol .  The carvedilol  was restarted.  Today she tells me that she is experiencing palpitation transiently at night.  Comes and goes.  She has to sit up at times to feel better.    Past Medical History:  Diagnosis Date   Ectopic pregnancy    Heart murmur     Hyperlipidemia    Hypertension    Insomnia, unspecified    OSA on CPAP    moderate OSA with an AHI of 14.8/hr on auto CPAP   Osteopenia 03/12/2021   Right knee pain    SVT (supraventricular tachycardia)     Past Surgical History:  Procedure Laterality Date   ABDOMINAL HYSTERECTOMY     BREAST CYST EXCISION Left    68 years old   COLONOSCOPY  2016   [facility]; 1 tubular adenoma, 1 hyperplastic polyp, TI biopsy with reactive lymphoid hyperplasia without active or chronic enteritis.   COLONOSCOPY N/A 12/02/2021   Procedure: COLONOSCOPY;  Surgeon: Shaaron Lamar HERO, MD;  Location: [facility];  Service: Endoscopy;  Laterality: N/A;  7:30am   cyst left breast      SALPINGECTOMY Left    TMJ ARTHROPLASTY      Current Medications: Current Meds  Medication Sig   albuterol  (VENTOLIN  HFA) 108 (90 Base) MCG/ACT inhaler Inhale 2 puffs into the lungs every 6 (six) hours as needed for wheezing or shortness of breath.   aspirin EC 81 MG tablet Take 81 mg by mouth daily. Swallow whole.   carvedilol  (COREG ) 12.5 MG tablet Take 1 tablet (12.5 mg total) by mouth 2 (two) times daily.   cholecalciferol (VITAMIN D3) 25 MCG (1000 UNIT) tablet Take 1,000 Units by mouth daily.  cloNIDine  (CATAPRES ) 0.2 MG tablet Take 1 tablet (0.2 mg total) by mouth 2 (two) times daily.   fluticasone  (FLONASE ) 50 MCG/ACT nasal spray Place 2 sprays into both nostrils daily.   furosemide  (LASIX ) 80 MG tablet Take 1 tablet by mouth once daily   levocetirizine (XYZAL ) 5 MG tablet Take 1 tablet (5 mg total) by mouth every evening.   lovastatin  (MEVACOR ) 40 MG tablet Take 1 tablet (40 mg total) by mouth at bedtime.   montelukast  (SINGULAIR ) 10 MG tablet Take 1 tablet (10 mg total) by mouth at bedtime.   Multiple Vitamins-Minerals (MULTIVITAMIN WITH MINERALS) tablet Take 1 tablet by mouth every other day.   spironolactone  (ALDACTONE ) 25 MG tablet Take 0.5 tablets (12.5 mg total) by mouth daily.   traZODone  (DESYREL ) 50 MG tablet  Take 1 tablet (50 mg total) by mouth at bedtime as needed.     Allergies:   Diltiazem , Anesthesia s-i-40 [propofol], Codeine, Olmesartan , Naproxen , Norvasc  [amlodipine ], and Tamiflu  [oseltamivir ]   Social History   Socioeconomic History   Marital status: Divorced    Spouse name: Not on file   Number of children: 1   Years of education: 12   Highest education level: 12th grade  Occupational History   Occupation: Retired  Tobacco Use   Smoking status: Never    Passive exposure: Never   Smokeless tobacco: Never  Vaping Use   Vaping status: Never Used  Substance and Sexual Activity   Alcohol use: Never   Drug use: Never   Sexual activity: Not Currently    Partners: Male    Birth control/protection: Surgical  Other Topics Concern   Not on file  Social History Narrative   Lives alone. Has one son - lives in [place].   Social Drivers of Health   Tobacco Use: Low Risk (11/06/2024)   Patient History    Smoking Tobacco Use: Never    Smokeless Tobacco Use: Never    Passive Exposure: Never  Financial Resource Strain: Low Risk (03/14/2024)   Overall Financial Resource Strain (CARDIA)    Difficulty of Paying Living Expenses: Not hard at all  Food Insecurity: Food Insecurity Present (03/14/2024)   Hunger Vital Sign    Worried About Running Out of Food in the Last Year: Often true    Ran Out of Food in the Last Year: Often true  Transportation Needs: Unmet Transportation Needs (03/14/2024)   PRAPARE - Administrator, Civil Service (Medical): Yes    Lack of Transportation (Non-Medical): Yes  Physical Activity: Insufficiently Active (03/14/2024)   Exercise Vital Sign    Days of Exercise per Week: 5 days    Minutes of Exercise per Session: 20 min  Stress: No Stress Concern Present (03/14/2024)   [employer] of Occupational Health - Occupational Stress Questionnaire    Feeling of Stress : Not at all  Social Connections: Moderately Isolated (03/14/2024)   Social  Connection and Isolation Panel    Frequency of Communication with Friends and Family: More than three times a week    Frequency of Social Gatherings with Friends and Family: More than three times a week    Attends Religious Services: More than 4 times per year    Active Member of Clubs or Organizations: No    Attends Banker Meetings: Never    Marital Status: Divorced  Depression (PHQ2-9): Low Risk (11/05/2024)   Depression (PHQ2-9)    PHQ-2 Score: 0  Alcohol Screen: Low Risk (03/14/2024)   Alcohol Screen  Last Alcohol Screening Score (AUDIT): 0  Housing: Low Risk (03/14/2024)   Housing Stability Vital Sign    Unable to Pay for Housing in the Last Year: No    Number of Times Moved in the Last Year: 0    Homeless in the Last Year: No  Utilities: Not At Risk (03/14/2024)   AHC Utilities    Threatened with loss of utilities: No  Health Literacy: Adequate Health Literacy (03/14/2024)   B1300 Health Literacy    Frequency of need for help with medical instructions: Never     Family History: The patient's family history includes COPD in her brother and brother; Colon cancer in her sister and sister; Diabetes in her brother, mother, and sister; Heart disease in her brother and mother; Hypertension in her brother, brother, father, mother, sister, and sister; Lung disease in her brother; Pancreatic cancer in her mother; Stroke in her brother and maternal grandmother; Thyroid  disease in her brother and mother. There is no history of Breast cancer.  ROS:   Review of Systems  Constitution: Negative for decreased appetite, fever and weight gain.  HENT: Negative for congestion, ear discharge, hoarse voice and sore throat.   Eyes: Negative for discharge, redness, vision loss in right eye and visual halos.  Cardiovascular: Negative for chest pain, dyspnea on exertion, leg swelling, orthopnea and palpitations.  Respiratory: Negative for cough, hemoptysis, shortness of breath and snoring.    Endocrine: Negative for heat intolerance and polyphagia.  Hematologic/Lymphatic: Negative for bleeding problem. Does not bruise/bleed easily.  Skin: Negative for flushing, nail changes, rash and suspicious lesions.  Musculoskeletal: Negative for arthritis, joint pain, muscle cramps, myalgias, neck pain and stiffness.  Gastrointestinal: Negative for abdominal pain, bowel incontinence, diarrhea and excessive appetite.  Genitourinary: Negative for decreased libido, genital sores and incomplete emptying.  Neurological: Negative for brief paralysis, focal weakness, headaches and loss of balance.  Psychiatric/Behavioral: Negative for altered mental status, depression and suicidal ideas.  Allergic/Immunologic: Negative for HIV exposure and persistent infections.    EKGs/Labs/Other Studies Reviewed:    The following studies were reviewed today:   EKG:  The ekg ordered today demonstrates   Recent Labs: 04/24/2024: Hemoglobin 12.0; Platelets 308 08/08/2024: ALT 10; BUN 12; Creatinine, Ser 0.61; Magnesium 2.3; Potassium 4.7; Sodium 144 09/26/2024: TSH 0.529  Recent Lipid Panel    Component Value Date/Time   CHOL 179 07/04/2024 1009   TRIG 64 07/04/2024 1009   HDL 67 07/04/2024 1009   CHOLHDL 2.7 07/04/2024 1009   LDLCALC 100 (H) 07/04/2024 1009    Physical Exam:    VS:  BP 136/86   Pulse 77   Ht 5' 10 (1.778 m)   Wt 298 lb 12.8 oz (135.5 kg)   SpO2 97%   BMI 42.87 kg/m     Wt Readings from Last 3 Encounters:  11/06/24 298 lb 12.8 oz (135.5 kg)  08/27/24 299 lb 9.6 oz (135.9 kg)  08/08/24 298 lb 12.8 oz (135.5 kg)     GEN: Well nourished, well developed in no acute distress HEENT: Normal NECK: No JVD; No carotid bruits LYMPHATICS: No lymphadenopathy CARDIAC: S1S2 noted,RRR, no murmurs, rubs, gallops RESPIRATORY:  Clear to auscultation without rales, wheezing or rhonchi  ABDOMEN: Soft, non-tender, non-distended, +bowel sounds, no guarding. EXTREMITIES: No edema, No  cyanosis, no clubbing MUSCULOSKELETAL:  No deformity  SKIN: Warm and dry NEUROLOGIC:  Alert and oriented x 3, non-focal PSYCHIATRIC:  Normal affect, good insight  ASSESSMENT:    1. NSVT (nonsustained ventricular tachycardia) (  HCC)   2. DOE (dyspnea on exertion)   3. Palpitations   4. OSA (obstructive sleep apnea)   5. PSVT (paroxysmal supraventricular tachycardia)   6. Morbid obesity (HCC)   7. Coronary artery disease involving native coronary artery of native heart without angina pectoris      PLAN:    Hypertension-her blood pressure is at target in the office today.  We reviewed her medication we will continue carvedilol  12.5 mg twice a day, clonidine  0.2 mg twice daily, spironolactone  12.5 mg daily.  PSVT/NSVT-she complains of palpitations only at nighttime when she goes to bed.  She has not been able to tolerate metoprolol , flecainide .  It is time to really reassess with a monitor 30-day event monitor will be placed on the patient.  Will also get a new echocardiogram to reassess for any structural abnormalities.   We talked in depth about her nocturnal palpitations.  I did share with the patient that despite the fact that we can control her heart rate there may be a potential that she may have to tolerate some sensation of the palpitations.  OSA she has declined the use of CPAP.  There is a lower extremity edema no changes in her furosemide .  CAD on coronary CTA no anginal symptoms continue aspirin and statin.    The patient understands the need to lose weight with diet and exercise. We have discussed specific strategies for this.   The patient is in agreement with the above plan. The patient left the office in stable condition.  The patient will follow up in   Medication Adjustments/Labs and Tests Ordered: Current medicines are reviewed at length with the patient today.  Concerns regarding medicines are outlined above.  Orders Placed This Encounter  Procedures    Cardiac event monitor   ECHOCARDIOGRAM COMPLETE   No orders of the defined types were placed in this encounter.   Patient Instructions  Medication Instructions:  Your physician recommends that you continue on your current medications as directed. Please refer to the Current Medication list given to you today.  *If you need a refill on your cardiac medications before your next appointment, please call your pharmacy*  Testing/Procedures: Your physician has requested that you have an echocardiogram - Arlean Salmon. Echocardiography is a painless test that uses sound waves to create images of your heart. It provides your doctor with information about the size and shape of your heart and how well your hearts chambers and valves are working. This procedure takes approximately one hour. There are no restrictions for this procedure. Please do NOT wear cologne, perfume, aftershave, or lotions (deodorant is allowed). Please arrive 15 minutes prior to your appointment time.  Please note: We ask at that you not bring children with you during ultrasound (echo/ vascular) testing. Due to room size and safety concerns, children are not allowed in the ultrasound rooms during exams. Our front office staff cannot provide observation of children in our lobby area while testing is being conducted. An adult accompanying a patient to their appointment will only be allowed in the ultrasound room at the discretion of the ultrasound technician under special circumstances. We apologize for any inconvenience.  [employer] Cardiac Event Monitor Instructions  Your physician has requested you wear your cardiac event monitor for _____ days, (1-30). [employer] may call or text to confirm a shipping address. The monitor will be sent to a land address via UPS. [employer] will not ship a monitor to a PO BOX. It typically takes 3-5  days to receive your monitor after it has been enrolled. [employer] will assist with USPS tracking if  your package is delayed. The telephone number for [employer] is [phone]. Once you have received your monitor, please review the enclosed instructions. Instruction tutorials can also be viewed under help and settings on the enclosed cell phone. Your monitor has already been registered assigning a specific monitor serial # to you.  Billing and Self Pay Discount Information  [employer] has been provided the insurance information we had on file for you.  If your insurance has been updated, please call [employer] at [phone] to provide them with your updated insurance information.   [employer] offers a discounted Self Pay option for patients who have insurance that does not cover their cardiac event monitor or patients without insurance.  The discounted cost of a Self Pay Cardiac Event Monitor would be $225.00 , if the patient contacts [employer] at [phone] within 7 days of applying the monitor to make payment arrangements.  If the patient does not contact [employer] within 7 days of applying the monitor, the cost of the cardiac event monitor will be $350.00.  Applying the monitor  Remove cell phone from case and turn it on. The cell phone works as it consultant and needs to be within [facility] of you at all times. The cell phone will need to be charged on a daily basis. We recommend you plug the cell phone into the enclosed charger at your bedside table every night.  Monitor batteries: You will receive two monitor batteries labelled #1 and #2. These are your recorders. Plug battery #2 onto the second connection on the enclosed charger. Keep one battery on the charger at all times. This will keep the monitor battery deactivated. It will also keep it fully charged for when you need to switch your monitor batteries. A small light will be blinking on the battery emblem when it is charging. The light on the battery emblem will remain on when the battery is fully charged.  Open package  of a Monitor strip. Insert battery #1 into black hood on strip and gently squeeze monitor battery onto connection as indicated in instruction booklet. Set aside while preparing skin.  Choose location for your strip, vertical or horizontal, as indicated in the instruction booklet. Shave to remove all hair from location. There cannot be any lotions, oils, powders, or colognes on skin where monitor is to be applied. Wipe skin clean with enclosed Saline wipe. Dry skin completely.  Peel paper labeled #1 off the back of the Monitor strip exposing the adhesive. Place the monitor on the chest in the vertical or horizontal position shown in the instruction booklet. One arrow on the monitor strip must be pointing upward. Carefully remove paper labeled #2, attaching remainder of strip to your skin. Try not to create any folds or wrinkles in the strip as you apply it.  Firmly press and release the circle in the center of the monitor battery. You will hear a small beep. This is turning the monitor battery on. The heart emblem on the monitor battery will light up every 5 seconds if the monitor battery in turned on and connected to the patient securely. Do not push and hold the circle down as this turns the monitor battery off. The cell phone will locate the monitor battery. A screen will appear on the cell phone checking the connection of your monitor strip. This may read poor connection initially but change to good connection within the  next minute. Once your monitor accepts the connection you will hear a series of 3 beeps followed by a climbing crescendo of beeps. A screen will appear on the cell phone showing the two monitor strip placement options. Touch the picture that demonstrates where you applied the monitor strip.  Your monitor strip and battery are waterproof. You are able to shower, bathe, or swim with the monitor on. They just ask you do not submerge deeper than 3 feet underwater. We recommend  removing the monitor if you are swimming in a lake, river, or ocean.  Your monitor battery will need to be switched to a fully charged monitor battery approximately once a week. The cell phone will alert you of an action which needs to be made.  On the cell phone, tap for details to reveal connection status, monitor battery status, and cell phone battery status. The green dots indicates your monitor is in good status. A red dot indicates there is something that needs your attention.  To record a symptom, click the circle on the monitor battery. In 30-60 seconds a list of symptoms will appear on the cell phone. Select your symptom and tap save. Your monitor will record a sustained or significant arrhythmia regardless of you clicking the button. Some patients do not feel the heart rhythm irregularities. [employer] will notify us  of any serious or critical events.  Refer to instruction booklet for instructions on switching batteries, changing strips, the Do not disturb or Pause features, or any additional questions.  Call [employer] at [phone], to confirm your monitor is transmitting and record your baseline. They will answer any questions you may have regarding the monitor instructions at that time.  Returning the monitor to [employer]  Place all equipment back into blue box. Peel off strip of paper to expose adhesive and close box securely. There is a prepaid UPS shipping label on this box. Drop in a UPS drop box, or at a UPS facility like [employer]. You may also contact [employer] to arrange UPS to pick up monitor package at your home.   Follow-Up: At [facility], you and your health needs are our priority.  As part of our continuing mission to provide you with exceptional heart care, our providers are all part of one team.  This team includes your primary Cardiologist (physician) and Advanced Practice Providers or APPs (Physician Assistants and Nurse Practitioners) who  all work together to provide you with the care you need, when you need it.  Your next appointment:   4 month(s)  Provider:   Amandeep Hogston, DO      Adopting a Healthy Lifestyle.  Know what a healthy weight is for you (roughly BMI <25) and aim to maintain this   Aim for 7+ servings of fruits and vegetables daily   65-80+ fluid ounces of water or unsweet tea for healthy kidneys   Limit to max 1 drink of alcohol per day; avoid smoking/tobacco   Limit animal fats in diet for cholesterol and heart health - choose grass fed whenever available   Avoid highly processed foods, and foods high in saturated/trans fats   Aim for low stress - take time to unwind and care for your mental health   Aim for 150 min of moderate intensity exercise weekly for heart health, and weights twice weekly for bone health   Aim for 7-9 hours of sleep daily   When it comes to diets, agreement about the perfect plan isnt easy to find, even among  the experts. Experts at the [facility] [employer] developed an idea known as the Healthy Eating Plate. Just imagine a plate divided into logical, healthy portions.   The emphasis is on diet quality:   Load up on vegetables and fruits - one-half of your plate: Aim for color and variety, and remember that potatoes dont count.   Go for whole grains - one-quarter of your plate: Whole wheat, barley, wheat berries, quinoa, oats, brown rice, and foods made with them. If you want pasta, go with whole wheat pasta.   Protein power - one-quarter of your plate: Fish, chicken, beans, and nuts are all healthy, versatile protein sources. Limit red meat.   The diet, however, does go beyond the plate, offering a few other suggestions.   Use healthy plant oils, such as olive, canola, soy, corn, sunflower and peanut. Check the labels, and avoid partially hydrogenated oil, which have unhealthy trans fats.   If youre thirsty, drink water. Coffee and tea are good in  moderation, but skip sugary drinks and limit milk and dairy products to one or two daily servings.   The type of carbohydrate in the diet is more important than the amount. Some sources of carbohydrates, such as vegetables, fruits, whole grains, and beans-are healthier than others.   Finally, stay active  Signed, Dub Huntsman, DO  11/06/2024 10:45 AM    [facility] "

## 2024-11-06 NOTE — Patient Instructions (Addendum)
 Medication Instructions:  Your physician recommends that you continue on your current medications as directed. Please refer to the Current Medication list given to you today.  *If you need a refill on your cardiac medications before your next appointment, please call your pharmacy*  Testing/Procedures: Your physician has requested that you have an echocardiogram - Arlean Salmon. Echocardiography is a painless test that uses sound waves to create images of your heart. It provides your doctor with information about the size and shape of your heart and how well your hearts chambers and valves are working. This procedure takes approximately one hour. There are no restrictions for this procedure. Please do NOT wear cologne, perfume, aftershave, or lotions (deodorant is allowed). Please arrive 15 minutes prior to your appointment time.  Please note: We ask at that you not bring children with you during ultrasound (echo/ vascular) testing. Due to room size and safety concerns, children are not allowed in the ultrasound rooms during exams. Our front office staff cannot provide observation of children in our lobby area while testing is being conducted. An adult accompanying a patient to their appointment will only be allowed in the ultrasound room at the discretion of the ultrasound technician under special circumstances. We apologize for any inconvenience.  [employer] Cardiac Event Monitor Instructions  Your physician has requested you wear your cardiac event monitor for _____ days, (1-30). [employer] may call or text to confirm a shipping address. The monitor will be sent to a land address via UPS. [employer] will not ship a monitor to a PO BOX. It typically takes 3-5 days to receive your monitor after it has been enrolled. [employer] will assist with USPS tracking if your package is delayed. The telephone number for [employer] is [phone]. Once you have received your monitor, please review the  enclosed instructions. Instruction tutorials can also be viewed under help and settings on the enclosed cell phone. Your monitor has already been registered assigning a specific monitor serial # to you.  Billing and Self Pay Discount Information  [employer] has been provided the insurance information we had on file for you.  If your insurance has been updated, please call [employer] at [phone] to provide them with your updated insurance information.   [employer] offers a discounted Self Pay option for patients who have insurance that does not cover their cardiac event monitor or patients without insurance.  The discounted cost of a Self Pay Cardiac Event Monitor would be $225.00 , if the patient contacts [employer] at [phone] within 7 days of applying the monitor to make payment arrangements.  If the patient does not contact [employer] within 7 days of applying the monitor, the cost of the cardiac event monitor will be $350.00.  Applying the monitor  Remove cell phone from case and turn it on. The cell phone works as it consultant and needs to be within [facility] of you at all times. The cell phone will need to be charged on a daily basis. We recommend you plug the cell phone into the enclosed charger at your bedside table every night.  Monitor batteries: You will receive two monitor batteries labelled #1 and #2. These are your recorders. Plug battery #2 onto the second connection on the enclosed charger. Keep one battery on the charger at all times. This will keep the monitor battery deactivated. It will also keep it fully charged for when you need to switch your monitor batteries. A small light will be blinking on the battery emblem when it is  charging. The light on the battery emblem will remain on when the battery is fully charged.  Open package of a Monitor strip. Insert battery #1 into black hood on strip and gently squeeze monitor battery onto connection as indicated in  instruction booklet. Set aside while preparing skin.  Choose location for your strip, vertical or horizontal, as indicated in the instruction booklet. Shave to remove all hair from location. There cannot be any lotions, oils, powders, or colognes on skin where monitor is to be applied. Wipe skin clean with enclosed Saline wipe. Dry skin completely.  Peel paper labeled #1 off the back of the Monitor strip exposing the adhesive. Place the monitor on the chest in the vertical or horizontal position shown in the instruction booklet. One arrow on the monitor strip must be pointing upward. Carefully remove paper labeled #2, attaching remainder of strip to your skin. Try not to create any folds or wrinkles in the strip as you apply it.  Firmly press and release the circle in the center of the monitor battery. You will hear a small beep. This is turning the monitor battery on. The heart emblem on the monitor battery will light up every 5 seconds if the monitor battery in turned on and connected to the patient securely. Do not push and hold the circle down as this turns the monitor battery off. The cell phone will locate the monitor battery. A screen will appear on the cell phone checking the connection of your monitor strip. This may read poor connection initially but change to good connection within the next minute. Once your monitor accepts the connection you will hear a series of 3 beeps followed by a climbing crescendo of beeps. A screen will appear on the cell phone showing the two monitor strip placement options. Touch the picture that demonstrates where you applied the monitor strip.  Your monitor strip and battery are waterproof. You are able to shower, bathe, or swim with the monitor on. They just ask you do not submerge deeper than 3 feet underwater. We recommend removing the monitor if you are swimming in a lake, river, or ocean.  Your monitor battery will need to be switched to a fully  charged monitor battery approximately once a week. The cell phone will alert you of an action which needs to be made.  On the cell phone, tap for details to reveal connection status, monitor battery status, and cell phone battery status. The green dots indicates your monitor is in good status. A red dot indicates there is something that needs your attention.  To record a symptom, click the circle on the monitor battery. In 30-60 seconds a list of symptoms will appear on the cell phone. Select your symptom and tap save. Your monitor will record a sustained or significant arrhythmia regardless of you clicking the button. Some patients do not feel the heart rhythm irregularities. [employer] will notify us  of any serious or critical events.  Refer to instruction booklet for instructions on switching batteries, changing strips, the Do not disturb or Pause features, or any additional questions.  Call [employer] at [phone], to confirm your monitor is transmitting and record your baseline. They will answer any questions you may have regarding the monitor instructions at that time.  Returning the monitor to [employer]  Place all equipment back into blue box. Peel off strip of paper to expose adhesive and close box securely. There is a prepaid UPS shipping label on this box. Drop in a UPS  drop box, or at a UPS facility like [employer]. You may also contact [employer] to arrange UPS to pick up monitor package at your home.   Follow-Up: At [facility], you and your health needs are our priority.  As part of our continuing mission to provide you with exceptional heart care, our providers are all part of one team.  This team includes your primary Cardiologist (physician) and Advanced Practice Providers or APPs (Physician Assistants and Nurse Practitioners) who all work together to provide you with the care you need, when you need it.  Your next appointment:   4 month(s)  Provider:    Kardie Tobb, DO

## 2024-11-12 ENCOUNTER — Inpatient Hospital Stay (HOSPITAL_COMMUNITY): Admission: RE | Admit: 2024-11-12 | Source: Ambulatory Visit

## 2024-11-20 ENCOUNTER — Ambulatory Visit
Admission: RE | Admit: 2024-11-20 | Discharge: 2024-11-20 | Disposition: A | Source: Ambulatory Visit | Attending: Family Medicine | Admitting: Family Medicine

## 2024-11-20 DIAGNOSIS — Z1231 Encounter for screening mammogram for malignant neoplasm of breast: Secondary | ICD-10-CM

## 2024-11-22 ENCOUNTER — Other Ambulatory Visit: Payer: Self-pay | Admitting: Family Medicine

## 2024-11-22 DIAGNOSIS — I10 Essential (primary) hypertension: Secondary | ICD-10-CM

## 2024-12-12 ENCOUNTER — Ambulatory Visit (HOSPITAL_COMMUNITY)

## 2024-12-19 ENCOUNTER — Ambulatory Visit (HOSPITAL_COMMUNITY)

## 2025-01-02 ENCOUNTER — Ambulatory Visit: Admitting: "Endocrinology

## 2025-01-10 ENCOUNTER — Other Ambulatory Visit (HOSPITAL_COMMUNITY)

## 2025-03-12 ENCOUNTER — Ambulatory Visit: Admitting: Cardiology

## 2025-03-19 ENCOUNTER — Ambulatory Visit
